# Patient Record
Sex: Male | Born: 1935
Health system: Southern US, Community
[De-identification: ages and names within clinical notes are randomized; demographics above are authoritative.]

## PROBLEM LIST (undated history)

## (undated) DIAGNOSIS — E785 Hyperlipidemia, unspecified: Secondary | ICD-10-CM

## (undated) DIAGNOSIS — L309 Dermatitis, unspecified: Secondary | ICD-10-CM

## (undated) DIAGNOSIS — N320 Bladder-neck obstruction: Secondary | ICD-10-CM

## (undated) DIAGNOSIS — R351 Nocturia: Secondary | ICD-10-CM

## (undated) DIAGNOSIS — T3121 Burns involving 20-29% of body surface with 10-19% third degree burns: Secondary | ICD-10-CM

## (undated) DIAGNOSIS — T4145XA Adverse effect of unspecified anesthetic, initial encounter: Secondary | ICD-10-CM

## (undated) DIAGNOSIS — Z974 Presence of external hearing-aid: Secondary | ICD-10-CM

## (undated) DIAGNOSIS — Z972 Presence of dental prosthetic device (complete) (partial): Secondary | ICD-10-CM

## (undated) DIAGNOSIS — T8859XA Other complications of anesthesia, initial encounter: Secondary | ICD-10-CM

## (undated) DIAGNOSIS — E039 Hypothyroidism, unspecified: Secondary | ICD-10-CM

## (undated) DIAGNOSIS — Z973 Presence of spectacles and contact lenses: Secondary | ICD-10-CM

## (undated) DIAGNOSIS — F419 Anxiety disorder, unspecified: Secondary | ICD-10-CM

## (undated) DIAGNOSIS — I10 Essential (primary) hypertension: Secondary | ICD-10-CM

## (undated) DIAGNOSIS — R600 Localized edema: Secondary | ICD-10-CM

## (undated) DIAGNOSIS — K5730 Diverticulosis of large intestine without perforation or abscess without bleeding: Secondary | ICD-10-CM

## (undated) DIAGNOSIS — Z87898 Personal history of other specified conditions: Secondary | ICD-10-CM

## (undated) DIAGNOSIS — E538 Deficiency of other specified B group vitamins: Secondary | ICD-10-CM

## (undated) DIAGNOSIS — J45909 Unspecified asthma, uncomplicated: Secondary | ICD-10-CM

## (undated) DIAGNOSIS — J309 Allergic rhinitis, unspecified: Secondary | ICD-10-CM

## (undated) HISTORY — DX: Essential (primary) hypertension: I10

## (undated) HISTORY — DX: Deficiency of other specified B group vitamins: E53.8

## (undated) HISTORY — DX: Burns involving 20-29% of body surface with 10-19% third degree burns: T31.21

## (undated) HISTORY — DX: Allergic rhinitis, unspecified: J30.9

## (undated) HISTORY — DX: Hyperlipidemia, unspecified: E78.5

## (undated) HISTORY — PX: COLONOSCOPY: SHX174

---

## 1979-05-12 DIAGNOSIS — T3121 Burns involving 20-29% of body surface with 10-19% third degree burns: Secondary | ICD-10-CM

## 1979-05-12 HISTORY — PX: ESCHAROTOMY: SHX248

## 1979-05-12 HISTORY — DX: Burns involving 20-29% of body surface with 10-19% third degree burns: T31.21

## 1998-06-28 ENCOUNTER — Other Ambulatory Visit: Admission: RE | Admit: 1998-06-28 | Discharge: 1998-06-28 | Payer: Self-pay | Admitting: Gastroenterology

## 2001-05-26 ENCOUNTER — Encounter (INDEPENDENT_AMBULATORY_CARE_PROVIDER_SITE_OTHER): Payer: Self-pay

## 2001-05-26 ENCOUNTER — Other Ambulatory Visit: Admission: RE | Admit: 2001-05-26 | Discharge: 2001-05-26 | Payer: Self-pay | Admitting: Otolaryngology

## 2004-02-02 ENCOUNTER — Ambulatory Visit (HOSPITAL_COMMUNITY): Admission: RE | Admit: 2004-02-02 | Discharge: 2004-02-02 | Payer: Self-pay | Admitting: Orthopedic Surgery

## 2004-03-01 ENCOUNTER — Emergency Department (HOSPITAL_COMMUNITY): Admission: EM | Admit: 2004-03-01 | Discharge: 2004-03-01 | Payer: Self-pay | Admitting: Emergency Medicine

## 2004-03-02 ENCOUNTER — Inpatient Hospital Stay (HOSPITAL_COMMUNITY): Admission: EM | Admit: 2004-03-02 | Discharge: 2004-03-06 | Payer: Self-pay | Admitting: Emergency Medicine

## 2004-03-04 HISTORY — PX: OTHER SURGICAL HISTORY: SHX169

## 2004-07-26 ENCOUNTER — Emergency Department (HOSPITAL_COMMUNITY): Admission: EM | Admit: 2004-07-26 | Discharge: 2004-07-26 | Payer: Self-pay | Admitting: Emergency Medicine

## 2006-02-01 ENCOUNTER — Ambulatory Visit: Payer: Self-pay | Admitting: Gastroenterology

## 2006-02-02 ENCOUNTER — Emergency Department (HOSPITAL_COMMUNITY): Admission: EM | Admit: 2006-02-02 | Discharge: 2006-02-02 | Payer: Self-pay | Admitting: Emergency Medicine

## 2006-02-20 ENCOUNTER — Ambulatory Visit: Payer: Self-pay | Admitting: Gastroenterology

## 2006-08-08 ENCOUNTER — Encounter: Admission: RE | Admit: 2006-08-08 | Discharge: 2006-08-08 | Payer: Self-pay | Admitting: Orthopedic Surgery

## 2007-09-17 ENCOUNTER — Emergency Department (HOSPITAL_COMMUNITY): Admission: EM | Admit: 2007-09-17 | Discharge: 2007-09-17 | Payer: Self-pay | Admitting: *Deleted

## 2008-02-06 ENCOUNTER — Encounter: Admission: RE | Admit: 2008-02-06 | Discharge: 2008-02-06 | Payer: Self-pay | Admitting: Orthopedic Surgery

## 2008-03-08 ENCOUNTER — Ambulatory Visit (HOSPITAL_COMMUNITY): Admission: RE | Admit: 2008-03-08 | Discharge: 2008-03-09 | Payer: Self-pay | Admitting: Orthopedic Surgery

## 2008-03-08 HISTORY — PX: HIP ARTHROSCOPY W/ LABRAL DEBRIDEMENT: SHX1749

## 2008-04-28 ENCOUNTER — Encounter: Admission: RE | Admit: 2008-04-28 | Discharge: 2008-04-28 | Payer: Self-pay | Admitting: Orthopedic Surgery

## 2008-07-12 ENCOUNTER — Emergency Department (HOSPITAL_BASED_OUTPATIENT_CLINIC_OR_DEPARTMENT_OTHER): Admission: EM | Admit: 2008-07-12 | Discharge: 2008-07-12 | Payer: Self-pay | Admitting: Emergency Medicine

## 2008-10-12 ENCOUNTER — Inpatient Hospital Stay (HOSPITAL_COMMUNITY): Admission: RE | Admit: 2008-10-12 | Discharge: 2008-10-17 | Payer: Self-pay | Admitting: Orthopedic Surgery

## 2008-10-12 HISTORY — PX: TOTAL HIP ARTHROPLASTY: SHX124

## 2009-01-05 ENCOUNTER — Ambulatory Visit (HOSPITAL_BASED_OUTPATIENT_CLINIC_OR_DEPARTMENT_OTHER): Admission: RE | Admit: 2009-01-05 | Discharge: 2009-01-06 | Payer: Self-pay | Admitting: Urology

## 2009-01-05 HISTORY — PX: TRANSURETHRAL RESECTION OF PROSTATE: SHX73

## 2009-01-07 ENCOUNTER — Emergency Department (HOSPITAL_COMMUNITY): Admission: EM | Admit: 2009-01-07 | Discharge: 2009-01-07 | Payer: Self-pay | Admitting: Emergency Medicine

## 2009-09-16 ENCOUNTER — Emergency Department (HOSPITAL_COMMUNITY): Admission: EM | Admit: 2009-09-16 | Discharge: 2009-09-16 | Payer: Self-pay | Admitting: Emergency Medicine

## 2009-12-13 ENCOUNTER — Encounter (INDEPENDENT_AMBULATORY_CARE_PROVIDER_SITE_OTHER): Payer: Self-pay | Admitting: *Deleted

## 2009-12-14 ENCOUNTER — Ambulatory Visit: Payer: Self-pay | Admitting: Gastroenterology

## 2009-12-28 ENCOUNTER — Ambulatory Visit: Payer: Self-pay | Admitting: Gastroenterology

## 2009-12-28 LAB — HM COLONOSCOPY

## 2010-09-28 ENCOUNTER — Ambulatory Visit (HOSPITAL_COMMUNITY)
Admission: RE | Admit: 2010-09-28 | Discharge: 2010-09-28 | Payer: Self-pay | Source: Home / Self Care | Attending: Orthopedic Surgery | Admitting: Orthopedic Surgery

## 2010-10-10 ENCOUNTER — Emergency Department (INDEPENDENT_AMBULATORY_CARE_PROVIDER_SITE_OTHER)
Admission: EM | Admit: 2010-10-10 | Discharge: 2010-10-10 | Payer: Self-pay | Source: Home / Self Care | Admitting: Emergency Medicine

## 2010-10-10 DIAGNOSIS — R042 Hemoptysis: Secondary | ICD-10-CM

## 2010-10-10 NOTE — Miscellaneous (Signed)
Summary: [facility] Previsit/prep  Clinical Lists Changes  Medications: Added new medication of MOVIPREP 100 GM  SOLR (PEG-KCL-NACL-NASULF-NA ASC-C) As per prep instructions. - Signed Rx of MOVIPREP 100 GM  SOLR (PEG-KCL-NACL-NASULF-NA ASC-C) As per prep instructions.;  #1 x 0;  Signed;  Entered by: Wyona Almas RN;  Authorized by: Mardella Layman MD [facility];  Method used: Electronically to [facility]*, [address] [place], [place], [place]  [address], Ph: [phone], Fax: [phone] Observations: Added new observation of NKA: T (12/14/2009 10:32)    Prescriptions: MOVIPREP 100 GM  SOLR (PEG-KCL-NACL-NASULF-NA ASC-C) As per prep instructions.  #1 x 0   Entered by:   Wyona Almas RN   Authorized by:   Mardella Layman MD [facility]   Signed by:   Wyona Almas RN on 12/14/2009   Method used:   Electronically to        [facility]* (retail)       [address] [place], [place]  [address]       Ph: [phone]       Fax: [phone]   RxID:   [id number]

## 2010-10-10 NOTE — Procedures (Signed)
Summary: Colonoscopy  Patient: [name] Note: All result statuses are Final unless otherwise noted.  Tests: (1) Colonoscopy (COL)   COL Colonoscopy           DONE     [facility]     [address]     [place], [place]  [address]           COLONOSCOPY PROCEDURE REPORT           PATIENT:  Erik Jackson, Erik Jackson  MR#:  [id number]     BIRTHDATE:  [birth year], 79 yrs. old  GENDER:  male     ENDOSCOPIST:  Vania Rea. Jarold Motto, MD, [facility]     REF. BY:     PROCEDURE DATE:  12/28/2009     PROCEDURE:  Surveillance Colonoscopy     ASA CLASS:  Class III     INDICATIONS:  Routine Risk Screening, history of polyps     MEDICATIONS:   Fentanyl 50 mcg IV, Versed 6 mg IV           DESCRIPTION OF PROCEDURE:   After the risks benefits and     alternatives of the procedure were thoroughly explained, informed     consent was obtained.  Digital rectal exam was performed and     revealed no abnormalities.   The Pentax Colonoscope [id number]     endoscope was introduced through the anus and advanced to the     cecum, which was identified by both the appendix and ileocecal     valve, without limitations.  The quality of the prep was     excellent, using MoviPrep.  The instrument was then slowly     withdrawn as the colon was fully examined.     <<PROCEDUREIMAGES>>           FINDINGS:  No polyps or cancers were seen.  This was otherwise a     normal examination of the colon.   Retroflexed views in the rectum     revealed no abnormalities.    The scope was then withdrawn from     the patient and the procedure completed.           COMPLICATIONS:  None     ENDOSCOPIC IMPRESSION:     1) No polyps or cancers     2) Otherwise normal examination     RECOMMENDATIONS:     1) Continue current colorectal screening recommendations for     "routine risk" patients with a repeat colonoscopy in 10 years.     REPEAT EXAM:  No           ______________________________     Vania Rea. Jarold Motto, MD, [facility]            CC:           n.     eSIGNED:   Vania Rea. Patterson at 12/28/2009 10:55 AM           Juanito Doom, [id number]  Note: An exclamation mark (!) indicates a result that was not dispersed into the flowsheet. Document Creation Date: 12/28/2009 10:56 AM _______________________________________________________________________  (1) Order result status: Final Collection or observation date-time: 12/28/2009 10:47 Requested date-time:  Receipt date-time:  Reported date-time:  Referring Physician:   Ordering Physician: Sheryn Bison [phone]) Specimen Source:  Source: Launa Grill Order Number: [id number] Lab site:   Appended Document: Colonoscopy    Clinical Lists Changes  Observations: Added new observation of COLONNXTDUE: 12/2019 (12/28/2009 14:08)

## 2010-10-10 NOTE — Letter (Signed)
Summary: [facility] Instructions  [facility]  [address] [place], [place] [address]   Phone: [phone]  Fax: [phone]       PHI AVANS    August 02, 1931    MRN: [id number]        Procedure Day Dorna Bloom: Wednesday  12/28/09     Arrival Time:  9:00am     Procedure Time: 10:00am     Location of Procedure:                    _X _  [facility] (4th Floor)                        PREPARATION FOR COLONOSCOPY WITH MOVIPREP   Starting 5 days prior to your procedure  Friday 04/15  do not eat nuts, seeds, popcorn, corn, beans, peas,  salads, or any raw vegetables.  Do not take any fiber supplements (e.g. Metamucil, Citrucel, and Benefiber).  THE DAY BEFORE YOUR PROCEDURE         DATE:  04/19   DAY:  Tuesday  1.  Drink clear liquids the entire day-NO SOLID FOOD  2.  Do not drink anything colored red or purple.  Avoid juices with pulp.  No orange juice.  3.  Drink at least 64 oz. (8 glasses) of fluid/clear liquids during the day to prevent dehydration and help the prep work efficiently.  CLEAR LIQUIDS INCLUDE: Water Jello Ice Popsicles Tea (sugar ok, no milk/cream) Powdered fruit flavored drinks Coffee (sugar ok, no milk/cream) Gatorade Juice: apple, white grape, white cranberry  Lemonade Clear bullion, consomm, broth Carbonated beverages (any kind) Strained chicken noodle soup Hard Candy                             4.  In the morning, mix first dose of MoviPrep solution:    Empty 1 Pouch A and 1 Pouch B into the disposable container    Add lukewarm drinking water to the top line of the container. Mix to dissolve    Refrigerate (mixed solution should be used within 24 hrs)  5.  Begin drinking the prep at 5:00 p.m. The MoviPrep container is divided by 4 marks.   Every 15 minutes drink the solution down to the next mark (approximately 8 oz) until the full liter is complete.   6.  Follow completed prep with 16 oz of clear liquid of your  choice (Nothing red or purple).  Continue to drink clear liquids until bedtime.  7.  Before going to bed, mix second dose of MoviPrep solution:    Empty 1 Pouch A and 1 Pouch B into the disposable container    Add lukewarm drinking water to the top line of the container. Mix to dissolve    Refrigerate  THE DAY OF YOUR PROCEDURE      DATE:  04/20  DAY:  Wednesday  Beginning at  5:00 a.m. (5 hours before procedure):         1. Every 15 minutes, drink the solution down to the next mark (approx 8 oz) until the full liter is complete.  2. Follow completed prep with 16 oz. of clear liquid of your choice.    3. You may drink clear liquids until 8:00am  (2 HOURS BEFORE PROCEDURE).   MEDICATION INSTRUCTIONS  Unless otherwise instructed, you should take regular prescription medications with a small sip of  water   as early as possible the morning of your procedure.   Additional medication instructions: Hold HCTZ the morning of procedure.         OTHER INSTRUCTIONS  You will need a responsible adult at least 75 years of age to accompany you and drive you home.   This person must remain in the waiting room during your procedure.  Wear loose fitting clothing that is easily removed.  Leave jewelry and other valuables at home.  However, you may wish to bring a book to read or  an iPod/MP3 player to listen to music as you wait for your procedure to start.  Remove all body piercing jewelry and leave at home.  Total time from sign-in until discharge is approximately 2-3 hours.  You should go home directly after your procedure and rest.  You can resume normal activities the  day after your procedure.  The day of your procedure you should not:   Drive   Make legal decisions   Operate machinery   Drink alcohol   Return to work  You will receive specific instructions about eating, activities and medications before you leave.    The above instructions have been reviewed and  explained to me by   Wyona Almas RN  December 14, 2009 11:33 AM    I fully understand and can verbalize these instructions _____________________________ Date _________

## 2010-11-26 LAB — CBC
HCT: 44.5 % (ref 39.0–52.0)
Hemoglobin: 15.4 g/dL (ref 13.0–17.0)
MCHC: 34.6 g/dL (ref 30.0–36.0)
MCV: 96 fL (ref 78.0–100.0)
Platelets: 237 10*3/uL (ref 150–400)
RBC: 4.64 MIL/uL (ref 4.22–5.81)
RDW: 14.4 % (ref 11.5–15.5)
WBC: 6.1 10*3/uL (ref 4.0–10.5)

## 2010-11-26 LAB — BASIC METABOLIC PANEL
BUN: 18 mg/dL (ref 6–23)
CO2: 29 mEq/L (ref 19–32)
Calcium: 9.6 mg/dL (ref 8.4–10.5)
Chloride: 98 mEq/L (ref 96–112)
Creatinine, Ser: 0.88 mg/dL (ref 0.4–1.5)
GFR calc Af Amer: >60 mL/min (ref >60)
GFR calc non Af Amer: >60 mL/min (ref >60)
Glucose, Bld: 90 mg/dL (ref 70–99)
Potassium: 3.6 mEq/L (ref 3.5–5.1)
Sodium: 138 mEq/L (ref 135–145)

## 2010-11-26 LAB — DIFFERENTIAL
Basophils Absolute: 0.1 10*3/uL (ref 0.0–0.1)
Basophils Relative: 1 % (ref 0–1)
Eosinophils Absolute: 0.5 10*3/uL (ref 0.0–0.7)
Eosinophils Relative: 7 % — ABNORMAL HIGH (ref 0–5)
Lymphocytes Relative: 33 % (ref 12–46)
Lymphs Abs: 2 10*3/uL (ref 0.7–4.0)
Monocytes Absolute: 0.8 10*3/uL (ref 0.1–1.0)
Monocytes Relative: 13 % — ABNORMAL HIGH (ref 3–12)
Neutro Abs: 2.8 10*3/uL (ref 1.7–7.7)
Neutrophils Relative %: 46 % (ref 43–77)

## 2010-12-20 LAB — POCT I-STAT 4, (NA,K, GLUC, HGB,HCT)
Glucose, Bld: 91 mg/dL (ref 70–99)
HCT: 46 % (ref 39.0–52.0)
Hemoglobin: 15.6 g/dL (ref 13.0–17.0)
Potassium: 3.8 mEq/L (ref 3.5–5.1)
Sodium: 140 mEq/L (ref 135–145)

## 2010-12-20 LAB — URINE CULTURE
Colony Count: NO GROWTH
Culture: NO GROWTH

## 2010-12-25 LAB — COMPREHENSIVE METABOLIC PANEL
ALT: 37 U/L (ref 0–53)
AST: 23 U/L (ref 0–37)
Albumin: 3.9 g/dL (ref 3.5–5.2)
Alkaline Phosphatase: 37 U/L — ABNORMAL LOW (ref 39–117)
CO2: 27 mEq/L (ref 19–32)
Chloride: 100 mEq/L (ref 96–112)
Creatinine, Ser: 1.19 mg/dL (ref 0.4–1.5)
GFR calc Af Amer: >60 mL/min (ref >60)
GFR calc non Af Amer: 60 mL/min — ABNORMAL LOW (ref >60)
Potassium: 3.6 mEq/L (ref 3.5–5.1)
Sodium: 138 mEq/L (ref 135–145)
Total Bilirubin: 0.8 mg/dL (ref 0.3–1.2)

## 2010-12-25 LAB — URINE MICROSCOPIC-ADD ON

## 2010-12-25 LAB — CBC
MCV: 95.8 fL (ref 78.0–100.0)
Platelets: 274 10*3/uL (ref 150–400)
RBC: 4.41 MIL/uL (ref 4.22–5.81)
WBC: 7.3 10*3/uL (ref 4.0–10.5)

## 2010-12-25 LAB — URINALYSIS, ROUTINE W REFLEX MICROSCOPIC
Bilirubin Urine: NEGATIVE
Glucose, UA: NEGATIVE mg/dL
Ketones, ur: NEGATIVE mg/dL
Leukocytes, UA: NEGATIVE
Nitrite: NEGATIVE
Protein, ur: NEGATIVE mg/dL
Specific Gravity, Urine: 1.018 (ref 1.005–1.030)
Urobilinogen, UA: 0.2 mg/dL (ref 0.0–1.0)
pH: 6 (ref 5.0–8.0)

## 2010-12-26 LAB — BASIC METABOLIC PANEL
BUN: 22 mg/dL (ref 6–23)
BUN: 9 mg/dL (ref 6–23)
CO2: 29 mEq/L (ref 19–32)
CO2: 29 mEq/L (ref 19–32)
CO2: 31 mEq/L (ref 19–32)
Chloride: 93 mEq/L — ABNORMAL LOW (ref 96–112)
Chloride: 95 mEq/L — ABNORMAL LOW (ref 96–112)
Chloride: 99 mEq/L (ref 96–112)
Chloride: 99 mEq/L (ref 96–112)
Creatinine, Ser: 0.86 mg/dL (ref 0.4–1.5)
GFR calc Af Amer: >60 mL/min (ref >60)
GFR calc Af Amer: >60 mL/min (ref >60)
GFR calc non Af Amer: >60 mL/min (ref >60)
Glucose, Bld: 115 mg/dL — ABNORMAL HIGH (ref 70–99)
Glucose, Bld: 128 mg/dL — ABNORMAL HIGH (ref 70–99)
Glucose, Bld: 97 mg/dL (ref 70–99)
Potassium: 3.8 mEq/L (ref 3.5–5.1)
Potassium: 3.8 mEq/L (ref 3.5–5.1)
Sodium: 132 mEq/L — ABNORMAL LOW (ref 135–145)
Sodium: 134 mEq/L — ABNORMAL LOW (ref 135–145)
Sodium: 136 mEq/L (ref 135–145)

## 2010-12-26 LAB — PROTIME-INR
INR: 1.1 (ref 0.00–1.49)
INR: 2.1 — ABNORMAL HIGH (ref 0.00–1.49)
Prothrombin Time: 14.8 seconds (ref 11.6–15.2)
Prothrombin Time: 25.2 seconds — ABNORMAL HIGH (ref 11.6–15.2)

## 2010-12-26 LAB — CBC
HCT: 33.5 % — ABNORMAL LOW (ref 39.0–52.0)
HCT: 33.8 % — ABNORMAL LOW (ref 39.0–52.0)
Hemoglobin: 11.5 g/dL — ABNORMAL LOW (ref 13.0–17.0)
Hemoglobin: 11.6 g/dL — ABNORMAL LOW (ref 13.0–17.0)
Hemoglobin: 12.5 g/dL — ABNORMAL LOW (ref 13.0–17.0)
MCHC: 33.9 g/dL (ref 30.0–36.0)
MCHC: 34.3 g/dL (ref 30.0–36.0)
MCV: 95.2 fL (ref 78.0–100.0)
MCV: 95.4 fL (ref 78.0–100.0)
MCV: 96.5 fL (ref 78.0–100.0)
Platelets: 182 10*3/uL (ref 150–400)
RBC: 3.5 MIL/uL — ABNORMAL LOW (ref 4.22–5.81)
RBC: 3.83 MIL/uL — ABNORMAL LOW (ref 4.22–5.81)
RDW: 13.9 % (ref 11.5–15.5)
WBC: 10.5 10*3/uL (ref 4.0–10.5)
WBC: 12.6 10*3/uL — ABNORMAL HIGH (ref 4.0–10.5)

## 2010-12-26 LAB — TYPE AND SCREEN

## 2011-01-23 NOTE — Op Note (Signed)
NAMELAMARK, SCHUE                ACCOUNT NO.:  [id number]   MEDICAL RECORD NO.:  [id number]          PATIENT TYPE:  AMB   LOCATION:  [facility]                         FACILITY:  [facility]   PHYSICIAN:  Maretta Bees. Vonita Moss, M.D.DATE OF BIRTH:  03/11/1931   DATE OF PROCEDURE:  01/05/2009  DATE OF DISCHARGE:                               OPERATIVE REPORT   PREOPERATIVE DIAGNOSES:  1. Benign prostatic hyperplasia.  2. High bladder neck.   POSTOPERATIVE DIAGNOSES:  1. Benign prostatic hyperplasia.  2. High bladder neck.   PRINCIPLE PROCEDURE:  Transurethral resection of the prostate (Gyrus).   SURGEON:  Maretta Bees. Vonita Moss, M.D.   RESIDENT:  Dr. Georgeanna Lea.   ANESTHESIA:  General.   DRAINS:  A 24 French 2-way Foley catheter.   COMPLICATIONS:  None.   ESTIMATED BLOOD LOSS:  Minimal.   INDICATIONS FOR PROCEDURE:  Patient is a 77 year old gentleman who had a  longstanding history of benign prostatic hyperplasia with bothersome  urinary tract symptoms, mainly hesitancy, straining to urinate, small  voids, nocturia.  Patient had tried medical therapy in the form of  anticholinergics and alpha antagonists using Flomax and Rapaflo with  minimal improvement in his symptoms.  Patient was evaluated with office  cystoscopy which revealed a high bladder neck, moderate bilobar  prostatic enlargement.  The decision was made after discussing the  different treatment options to bring him to the operating room for  transurethral resection of the prostate using Gyrus electrode.  The  risks and benefits of the procedure were explained, and informed consent  obtained.   DESCRIPTION OF PROCEDURE IN DETAIL:  Patient brought to the operating  room.  Placed in a supine position.  Administered general anesthesia by  the anesthesia team.  A proper time-out was performed to correctly  identify patient, procedure, and the site.  Patient was given  appropriate preoperative antibiotic.  Patient was  subsequently placed in  a dorsal lithotomy position.  Bilateral pressure points were well  padded.  Patient was prepped and draped in the usual sterile manner.  Using a 22 French sheath and a 12 degree lens, we then performed  cystourethroscopy.  The patient's anterior urethra was normal.  Posterior urethra revealed moderate bilobar prostatic enlargement.  Upon  entering the bladder, the right and left ureteral orifices were then  found in normal anatomic position.  Of note, we also appreciated again,  a high bladder neck.  The rest of the cystoscopy revealed 2+  trabeculations.  No other mass, lesion, or stone was seen.  We then  removed the cystoscope, performed urethral calibration beginning from a  24 [place], going up to 30 [place].  Obturators were placed, 28 French  resectoscope sheath, and then Gyrus electrode was introduced after  resectoscope.  We then performed sequential resection of the prostate  using Gyrus electrode, beginning at the 6 o'clock position.  At all  times, care was taken to be distal to the ureteral orifices and proximal  to the verumontanum.  We then sequentially used Gyrus electrode to  vaporize bilaterally to enlarged bilobar prostatic lobes.  We  then used  Gyrus electrode to vaporized both the lateral lobes.  We then performed  vaporization of the anterior aspect.  There was no active bleeding seen.  We examined the prostatic urethra at the verumontanum under no flow.  The channel looked adequately open.  We again examined the resection  bed.  For the most part, we had resected down to the prostatic capsule,  it seemed.  We then removed the resectoscope and placed a 24 French 2-  way Foley catheter and irrigated to light pink.  This marked the end of  the procedure.  Patient was subsequently extubated and transferred in  stable condition to the recovery room.   Of note, Dr. Vonita Moss was present and available for all of the aspects  of the case.       Erik Kitten, MD      Maretta Bees. Vonita Moss, M.D.  Electronically Signed    DW/MEDQ  D:  01/05/2009  T:  01/05/2009  Job:  [id number]

## 2011-01-23 NOTE — Op Note (Signed)
NAMEARLEY, SALAMONE                ACCOUNT NO.:  [id number]   MEDICAL RECORD NO.:  [id number]          PATIENT TYPE:  AMB   LOCATION:  DAY                          FACILITY:  [facility]   PHYSICIAN:  Ollen Gross, M.D.    DATE OF BIRTH:  [birth year]   DATE OF PROCEDURE:  03/08/2008  DATE OF DISCHARGE:                               OPERATIVE REPORT   PREOPERATIVE DIAGNOSIS:  Right hip labral tear.   POSTOPERATIVE DIAGNOSIS:  Right hip labral tear, plus chondral defect  and degenerative change.   PROCEDURE:  Right hip arthroscopy with labral debridement and  chondroplasty.   SURGEON:  Dr. Lequita Halt.   ASSISTANT:  Avel Peace, P.A.-C.   ANESTHESIA:  General.   ESTIMATED BLOOD LOSS:  Minimal.   DRAINS:  None.   COMPLICATIONS:  None.   CONDITION:  Stable to recovery.   CLINICAL NOTE:  Mr. Kundinger is a 77 year old male who has had several year  history of intermittent hip pain but has gotten progressively worse over  the past several months.  He developed intractable pain.  His plain  radiographs still showed good preservation of joint space.  He did have  intra-articular injection which did help temporarily, but then the pain  got a lot worse.  He is not at a stage where we have contemplated hip  replacement surgery.  His pain, however, is intractable with mechanical  symptoms.  Thus, we opted for arthroscopy and debridement.   PROCEDURE IN DETAIL:  After successful administration of general  anesthetic, the patient was placed in the left lateral decubitus  position with the right side up with his right lower extremity draped  over the well-padded perineal post, left lower extremity on a well-  padded table.  The foot was placed into the traction boot which was then  secured.  Under fluoroscopic guidance, traction was applied across the  joint until it was adequately distracted.  Once adequately distracted,  it was locked into position.  The thighs were then prepped and draped in  the usual sterile fashion.  Spinal needles were passed at the sites  marked for the anterior and posterior peritrochanteric portal and felt  to go into the joint.  Thirty mL of saline was injected through the  posterior needle with outflow through the anterior needle confirming  that both are intra-articular.  Nitinol wire was passed, and the  posterior portal was created.  The camera was then passed into the  joint.  Arthroscopic visualization proceeded.  He had fairly extensive  degenerative change with high-grade chondromalacia as well as exposed  bone on the central aspect of the femoral head.  The anterior and  posterior chondromalacia without exposed bone.  On the acetabulum, the  whole anterior half had a combination of high-grade chondromalacia,  grade 3 and 4, with exposed bone and in focal areas.  He has torn the  anterior and superior labrum.  Posteriorly, the joint showed  chondromalacia but not exposed bone.  We created the anterior portal and  then passed a shaver.  Unstable cartilage was debrided back to a stable  cartilaginous edge and a stable bony base.  The labrum was also debrided  down to a stable base and then sealed off with the ArthroCare device.  I  again inspected entire joint.  There was no other unstable cartilage.  Once again, he had some grade 3 and 4 changes on the femoral head and  anteriorly, but all of the cartilage remaining was stable.  I then  injected 30 mL of 0.25% Marcaine with epinephrine through the inflow  cannula, removed the arthroscopic lamina, and released the traction from  the joint.  The incision was then closed interrupted 4-0 nylon.  A bulky  sterile dressing was applied, and he was awakened and transported to  recovery in stable condition.      Ollen Gross, M.D.  Electronically Signed     FA/MEDQ  D:  03/08/2008  T:  03/08/2008  Job:  [id number]

## 2011-01-23 NOTE — H&P (Signed)
Erik Jackson, Erik Jackson                ACCOUNT NO.:  [id number]   MEDICAL RECORD NO.:  [id number]          PATIENT TYPE:  INP   LOCATION:  1619                         FACILITY:  [facility]   PHYSICIAN:  Ollen Gross, M.D.    DATE OF BIRTH:  [birth year]   DATE OF ADMISSION:  10/12/2008  DATE OF DISCHARGE:                              HISTORY & PHYSICAL   CHIEF COMPLAINT:  Right hip pain.   HISTORY OF PRESENT ILLNESS:  Patient is a 77 year old male who has been  seen by Dr. Lequita Halt for ongoing right hip pain and arthritis.  Pain is  progressively getting worse.  He had had an arthroscopy several months  ago but was noted to have bone on bone changes in the hip.  He has gone  on to have progressive pain.  Intra-articular injection only helped  temporarily, he is at a point now where he would like to have something  done about it.  Risks and benefits have been discussed, he elects to  proceed with the surgery.   ALLERGIES:  NONE.   CURRENT MEDICATIONS:  Pravastatin, finasteride, hydrochlorothiazide,  lisinopril, aspirin, amlodipine, Flomax.   PAST MEDICAL HISTORY:  1. Hypertension.  2. Questionable remote history of enlarged prostate.  3. Urinary retention.  4. Hearing impairment utilizing hearing aids.   PAST SURGICAL HISTORY:  1. Skin grafting.  2. Cervical fusion.   FAMILY HISTORY:  Father with history of cancer, mother with history of  heart disease and pacemaker.   SOCIAL HISTORY:  Married, nonsmoker, no alcohol.  Lives with wife, has 3  steps entering his home.   REVIEW OF SYSTEMS:  GENERAL:  Denied fevers, chills or night sweats.  NEURO:  No seizures, syncope or paralysis.  RESPIRATORY:  No shortness  of breath, productive cough or hemoptysis.  CARDIOVASCULAR:  No chest  pain, or orthopnea.  GI:  No nausea, vomiting, diarrhea, constipation.  GU:  Does have a little bit of urinary retention with a questionable  history of enlarged prostate, no dysuria or hematuria.   MUSCULOSKELETAL:  Right hip.   PHYSICAL EXAMINATION:  VITAL SIGNS:  Pulse 76, respirations 14, blood  pressure 124/72.  GENERAL:  A 77 year old white male, well-nourished, well-developed, in  no acute distress.  He is alert and oriented, cooperative, very  pleasant.  HEENT:  Normocephalic/atraumatic, pupils are round and reactive,  oropharynx clear, EOMs intact.  NECK:  Supple, no bruits.  CHEST:  Clear.  HEART:  Regular rate and rhythm without murmur, S1-S2 noted.  ABDOMEN:  Soft, nontender, bowel sound present.  RECTAL/BREASTS/GENITALIA:  Not done, not pertinent to present illness.  EXTREMITIES:  Right hip flexion 100, internal rotation 10, external  rotation 20, abduction 30.   IMPRESSION:  Osteoarthritis right hip.   PLAN:  Patient admitted to [facility] to undergo a right total  hip replacement and arthroplasty.  Surgery will be performed by Dr.  Ollen Gross.      Alexzandrew L. Perkins, P.A.C.      Ollen Gross, M.D.  Electronically Signed    ALP/MEDQ  D:  10/14/2008  T:  10/14/2008  Job:  [id number]   cc:   Maretta Bees. Vonita Moss, M.D.  Fax: [phone]   Dr. Lesle Chris [facility] [place]  [address]  [place], [place] [address]   Ollen Gross, M.D.  Fax: [phone]

## 2011-01-23 NOTE — Op Note (Signed)
NAMEZACARIAS, KRAUTER                ACCOUNT NO.:  [id number]   MEDICAL RECORD NO.:  [id number]          PATIENT TYPE:  INP   LOCATION:  1619                         [facility]ACILITY:  [facility]   PHYSICIAN:  Ollen Gross, M.D.    DATE OF BIRTH:  01-12-1931   DATE OF PROCEDURE:  10/12/2008  DATE OF DISCHARGE:                               OPERATIVE REPORT   PREOPERATIVE DIAGNOSIS:  Osteoarthritis, right hip.   POSTOPERATIVE DIAGNOSIS:  Osteoarthritis, right hip.   PROCEDURE:  Right total hip arthroplasty.   SURGEON:  Aluisio.   ASSISTANT:  Avel Peace, PA-C.   ANESTHESIA:  General.   ESTIMATED BLOOD LOSS:  300.   DRAIN:  Hemovac x1.   COMPLICATIONS:  None.   CONDITION:  Stable to recovery.   BRIEF CLINICAL NOTE:  Mr. Knoedler is a 77 year old male with end-stage  arthritis of the right hip with progressively worsening pain and  dysfunction.  He has had a hip arthroscopy and extensive nonoperative  management, but his pain has gotten progressively worse.  He presents  now for total hip arthroplasty.   PROCEDURE IN DETAIL:  After successful administration of general  anesthetic, the patient was placed in the left lateral decubitus  position with the right side up and held with the hip positioner.  The  right lower extremity was isolated from his perineum with plastic drapes  and prepped and draped in the usual sterile fashion.  Short  posterolateral incision is made with a 10 blade through subcutaneous  tissue to the level of fascia lata which is incised in line with the  skin incision.  Sciatic nerve was palpated and protected and the short  external rotators isolated off the femur.  Capsulectomy is performed and  the hip was dislocated.  The center of the femoral head is marked and a  trial prosthesis is placed such that the center of the trial head  corresponds to the center of his native femoral head.  Osteotomy line is  marked on the femoral neck and osteotomy made with an  oscillating saw.  The femoral head is removed and the femur is retracted anteriorly to  gain acetabular exposure.   Acetabular retractors were placed in the labrum and osteophytes removed.  Acetabular reaming starts at 45 mm coursing increments to 53 mm and then  a 54 mm pinnacle acetabular shell is placed in anatomic position and  transfixed with two dome screws.  Trial 36 mm neutral +4 liner is  placed.   The femur is prepared with the canal finder and irrigation.  Axial  reaming is performed at 13.5 mm, proximal reaming to 18 D and the sleeve  machined to a large.  The 81 D large trial sleeve is placed with an 18 x  13 stem and 36 +8 neck about 10 degrees beyond native anteversion.  The  trial 36 +0 head is placed and the hip is reduced with outstanding  stability.  There is full extension, full external rotation, 90 degrees  flexion, 70 degrees internal rotation and the hip stable in those  planes.  By placing  the right leg on top of the left the leg lengths  were felt to be equal.  The hip is then dislocated and all trials are  removed.  The permanent apex hole eliminator is placed in the acetabular  shell and the permanent 36 mm neutral +4 marathon liner was placed.  On  the femoral side we placed the permanent 18 D large sleeve, an 18 x 13  stem, a 36 +8 neck 20 degrees beyond native anteversion.  The 36 +0 head  is placed and the hip is reduced with the same stability parameters.  The wound is copiously irrigated with saline solution and the short  rotators reattached to the femur through drill holes.  Fascia lata is  closed over a Hemovac drain with interrupted #1 Vicryl, subcu closed  with #1-0 and #2-0 Vicryl and subcuticular running 4-0 Monocryl.  The  drain is hooked to suction.  Incision cleaned and dried and Steri-Strips  and a bulky sterile dressing applied.  He is then placed into a knee  immobilizer, awakened and transported to recovery in stable condition.       Ollen Gross, M.D.  Electronically Signed     FA/MEDQ  D:  10/12/2008  T:  10/13/2008  Job:  [id number]

## 2011-01-26 NOTE — Op Note (Signed)
NAME:  Erik Jackson, BIELLO                          ACCOUNT NO.:  [id number]   MEDICAL RECORD NO.:  [id number]                   PATIENT TYPE:  INP   LOCATION:  6705                                 FACILITY:  [facility]   PHYSICIAN:  Suzanna Obey, M.D.                    DATE OF BIRTH:  Dec 18, 1930   DATE OF PROCEDURE:  03/04/2004  DATE OF DISCHARGE:                                 OPERATIVE REPORT   PREOPERATIVE DIAGNOSIS:  Left peritonsillar and inferior space infection.   POSTOPERATIVE DIAGNOSIS:  Left peritonsillar and inferior space infection.   OPERATION PERFORMED:  Incision and drainage of left peritonsillar abscess.   SURGEON:  Suzanna Obey, M.D.   ANESTHESIA:  General endotracheal.   ESTIMATED BLOOD LOSS:  Less than 10 mL.   INDICATIONS FOR PROCEDURE:  This is a 77 year old who has had significant  pharyngeal and laryngeal swelling that occurred somewhat suddenly but  roughly over a day or two.  He was seen in the emergency room, had not  improved and was admitted with laryngeal edema and placed on steroids and  intravenous antibiotics.  The patient had a CT scan which showed a 1 cm left  peritonsillar abscess and a significant amount of laryngeal and pharyngeal  edema.  This was cooled off with the medical therapy and now a repeat CT  scan showed enlargement of the left peritonsillar abscess area, but  decreased laryngeal and pharyngeal edema.  The patient was informed of the  risks and benefits of the procedure including bleeding, infection, scarring,  recurrence, and risks of the anesthetic.  He understands that tracheotomy  possibly might be necessary.  This was described and risks including  bleeding, infection, pneumothorax, laryngeal and tracheal scarring and risks  of the anesthetic.  All questions were answered and consent was obtained.   DESCRIPTION OF PROCEDURE:  The patient was taken to the operating room and  placed in supine position after adequate general endotracheal  tube  anesthesia, was placed in the Rose position.  A McIvor mouth gag was  inserted and retracted and suspended from the Mayo stand.  A left  peritonsillar incision was made with electrocautery.  Dissection was carried  down to the peritonsillar space with electrocautery and then using the  tonsil hemostat, the peritonsillar space was opened.  Foul-smelling brown  pus was encountered and this area was opened widely with the tonsil  hemostat.  The tonsil hemostat was placed deeply inferiorly and medially to  drain all aspects of this abscess cavity which did extend quite inferiorly  along below the tonsil.  A Jankauer suction was then placed into the opening  and again to completely drain the abscess space.  Through the Jankauer  suction, the saline irrigation was performed to irrigate the cavity.  There  was good hemostasis.  The larynx was then examined with the laryngoscopy and  he appeared to  have no significant edema of his arytenoids or glottis and  there was still of course posterior pharyngeal wall swelling and left  lateral pharyngeal wall swelling.  He tolerated this procedure very well.  He was awakened and brought to recovery in stable condition, counts correct.                                               Suzanna Obey, M.D.   [name]  D:  03/04/2004  T:  03/05/2004  Job:  [id number]

## 2011-01-26 NOTE — Discharge Summary (Signed)
NAME:  Erik Jackson, Erik Jackson                          ACCOUNT NO.:  [id number]   MEDICAL RECORD NO.:  [id number]                   PATIENT TYPE:  INP   LOCATION:  6705                                 FACILITY:  [facility]   PHYSICIAN:  Suzanna Obey, M.D.                    DATE OF BIRTH:  [birth year]   DATE OF ADMISSION:  03/02/2004  DATE OF DISCHARGE:  03/06/2004                                 DISCHARGE SUMMARY   ADMITTING DIAGNOSES:  Supraglottitis.   DISCHARGE DIAGNOSES:  Supraglottitis.   SURGICAL PROCEDURES:  None.   HISTORY OF PRESENT ILLNESS:  This is a 77 year old who has about a two day  history of swelling in his throat and sore throat as well as difficulty  swallowing.  He has also had a fever and trismus.  Started after an upper  respiratory infection.  Went to [facility] and had plain  film x-ray which did not show what the results are.  He was discharged  without any antibiotics.  He has had increasing symptoms, difficulty taking  p.o.  He has not had any trauma or foreign body ingestion and no strider.  He, on examination, had edema of his epiglottis and arytenoids, and the  piriforms were both obliterated.  He was admitted for intravenous  antibiotics.   HOSPITAL COURSE:  He did reasonably well.  Remained without any strider.  Did have some wheezing bilaterally in the lungs.  Postoperative day one his  edema was decreased and this was with intravenous antibiotics and steroids.  He also was treated with albuterol for the wheezing which did improve.  He  continued to have sore throat and difficulty handling his own secretions.  Even though the edema was decreased, he still had swelling in the left  tonsillar region suggesting that he was having a peritonsillar abscess.  This was confirmed with a CT scan.  It was elected, therefore, that despite  intravenous antibiotics and steroids, he was not improving and that an  abscess needed to be drained.  He was taken to  the operating room and  underwent incision and drainage of left peritonsillar abscess.  Significant  purulent material was identified and extended quite significantly inferiorly  below the tonsil in the deep tissue.  This was all irrigated.  After this  procedure he felt substantially better.  He was able to swallow.  Steroids  were discontinued.  Transferred to the floor.  He was ambulating.  He was  afebrile.  He was discharged to home on Augmentin XR two pills b.i.d. for 10  days.  He will follow up in two weeks, sooner if he is worse.  Suzanna Obey, M.D.    [name]  D:  04/20/2004  T:  04/21/2004  Job:  [id number]

## 2011-01-26 NOTE — Discharge Summary (Signed)
NAMEFRAZER, RAINVILLE                ACCOUNT NO.:  [id number]   MEDICAL RECORD NO.:  [id number]          PATIENT TYPE:  INP   LOCATION:  1619                         FACILITY:  [facility]   PHYSICIAN:  Ollen Gross, M.D.    DATE OF BIRTH:  1931-07-20   DATE OF ADMISSION:  10/12/2008  DATE OF DISCHARGE:  10/17/2008                               DISCHARGE SUMMARY   ADMITTING DIAGNOSIS:  1. Osteoarthritis right hip.  2. Hypertension.  3. Questionable remote history of enlarged prostate.  4. Urinary retention hearing impairment.   DISCHARGE DIAGNOSIS:  1. Osteoarthritis right hip status post right total hip replacement      arthroplasty.  2. Hyponatremia.  3. Questionable remote history of enlarged prostate.  4. Urinary retention hearing impairment.  5. Hypertension.   PROCEDURE:  October 12, 2008 right total hip.  Surgeon Dr. Lequita Halt.  Assistant Avel Peace, PA-C.  Anesthesia general.   CONSULTS:  None.   BRIEF HISTORY:  [name] is a 77 year old male with end-stage  arthritis, progressive worsening pain and dysfunction, had an  arthroscopy extensive failed nonoperative management was got progressive  pain and worsening, he is admitted for total hip.  Risks and benefits  discussed.  The patient admitted to the hospital.   LABORATORY DATA:  Preop CBC showed hemoglobin of 14.3, crit of 42,  platelets 278,000.  Postoperative hemoglobin 12.5, drift down to 11.6.  Last H and H 11.5 and 33.  PT/PTT preop 13.9 and 27 respectively.  INR  1.  Followed per Coumadin protocol.  Last PT/INR 24.9 and 2.1.  Chem  panel on admission all within normal limits with exception low sodium  137.  BMET followed.  Sodium did drop 138 to 132 was improving back up  to 133, glucose went up 107 to 142 back down to 97.  Preop UA moderate  hemoglobin 7-10 red cells otherwise negative.  Blood group type A+.   EKG October 04, 2008 normal sinus rhythm, normal EKG.  Unconfirmed hip  films x-rays October 08, 2008  with significant general changes right hip  with significant narrowing of the superior joint space.  Hip and pelvis  film on October 12, 2008 satisfactory right total hip replacement.   HOSPITAL COURSE:  The patient admitted was taken to OR, underwent above-  stated procedure without complication.  The patient's procedure well,  later transferred to the orthopedic floor started on PCA and p.o.  analgesic given 24 hours postop IV antibiotics.  Coumadin for DVT  prophylaxis.  On the morning of day one the patient had pretty good  night doing pretty well following surgery and blood pressure was stable.  Hemoglobin was good at 12.5.  Hemovac drain was pulled.  He did have a  Foley had a history of urinary retention.  We tried his Foley out the  next morning for voiding trial.  Sodium was down a little bit down to  134.  Discontinued fluids and output was good, titrate back up.  Had  constipation so we gave medications for that.  He started to walk short  distance on day  one a day 1 of 20 and 60 feet.  Started to progress  well.  Dressing change on day two and day three incision continued to  heal well throughout the hospital course.  By day three his sodium  bottomed out of 132 and then started coming back up.  He fell on the  evening of day two.  The difficulty voiding had to have his Foley were  replaced.  On the morning of day three he was rechecked.  We did have  documented history of some problems with his prostate.  We had to put  Foley back again on the night of day two and we tried again on day three  for voiding trial and decided we would get him back to the urologist if  he did have problems.  He started to void okay.  By day four the patient  was a little weak after his therapy, so the patient discharge was held.  Held blood pressure meds for any kind of hypotension.  He kept another  day until October 17, 2008, postop day #5 still had difficulty  urinating.  The wound was healing  well.  They had the Foley catheter  back in and switched him over to a leg bag.  Once this was done, he had  already started to meet his goals with therapy.  Improved from that  standpoint was discharged home.   DISCHARGE/PLAN:  1. The patient discharged home on October 17, 2008.  2. Discharge diagnosis please see above.  3. Discharge meds:  Percocet, Robaxin, Coumadin.  4. Diet as tolerated.  5. Follow up 2 weeks with Dr. Lequita Halt following surgery.  He is also      to follow up Dr. Larey Dresser within a week of discharge due to      have an Foley leg bag placed.   ACTIVITY:  Partial weightbearing, 25-50% right hip and right leg.  Hip  precautions total protocol.  PT and OT.  Home health PT and home health  nursing.   DISPOSITION:  Home.   CONDITION ON DISCHARGE:  Improving from orthopedic standpoint.  Urinary  retention with leg bag Foley placed to follow up urology outpatient.      Alexzandrew L. Perkins, P.A.C.      Ollen Gross, M.D.  Electronically Signed    ALP/MEDQ  D:  11/18/2008  T:  11/18/2008  Job:  [id number]   cc:   Ollen Gross, M.D.  Fax: [phone]   Maretta Bees. Vonita Moss, M.D.  Fax: [phone]   Dr. Donivan Scull [facility]  [address]  [place], [place] [address]

## 2011-03-20 ENCOUNTER — Encounter: Payer: Self-pay | Admitting: Internal Medicine

## 2011-03-21 ENCOUNTER — Encounter: Payer: Self-pay | Admitting: Internal Medicine

## 2011-03-21 ENCOUNTER — Ambulatory Visit (INDEPENDENT_AMBULATORY_CARE_PROVIDER_SITE_OTHER): Payer: Medicare Other | Admitting: Internal Medicine

## 2011-03-21 DIAGNOSIS — E785 Hyperlipidemia, unspecified: Secondary | ICD-10-CM

## 2011-03-21 DIAGNOSIS — E079 Disorder of thyroid, unspecified: Secondary | ICD-10-CM

## 2011-03-21 DIAGNOSIS — I10 Essential (primary) hypertension: Secondary | ICD-10-CM

## 2011-03-21 DIAGNOSIS — Z0000 Encounter for general adult medical examination without abnormal findings: Secondary | ICD-10-CM
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
## 2011-03-22 ENCOUNTER — Encounter: Payer: Self-pay | Admitting: Internal Medicine

## 2011-03-22 DIAGNOSIS — E039 Hypothyroidism, unspecified: Secondary | ICD-10-CM | POA: Insufficient documentation

## 2011-03-22 DIAGNOSIS — Z0000 Encounter for general adult medical examination without abnormal findings: Secondary | ICD-10-CM | POA: Insufficient documentation

## 2011-03-22 DIAGNOSIS — I10 Essential (primary) hypertension: Secondary | ICD-10-CM | POA: Insufficient documentation

## 2011-03-22 DIAGNOSIS — E785 Hyperlipidemia, unspecified: Secondary | ICD-10-CM | POA: Insufficient documentation

## 2011-03-22 NOTE — Assessment & Plan Note (Signed)
Hypothyroid - doing well on present replacement dose.

## 2011-03-22 NOTE — Assessment & Plan Note (Signed)
Appears to be a generally healthy man. Physical exam was normal. Last colonoscopy in April of '11 and at 15 further screening in the absence of symptoms will not be necessary. Immunizations: no record of immunizations at [place] available so he is due for Tdap, pneumonia vaccine and shingles vaccine unless we can obtain records of current immunization status.   He is asked to return for routine care as needed and for a full physical exam with labs one year from his last [facility] physical

## 2011-03-22 NOTE — Assessment & Plan Note (Signed)
BP Readings from Last 3 Encounters:  03/21/11 138/80   Adequate control on hydochlorothiazide 25 mg daily  and lisinopril 40 mg daily. He has had recent blood work with normal renal function  Plan - continue present medications

## 2011-03-22 NOTE — Assessment & Plan Note (Signed)
Patient has been followed at the [place]. Last lab indicates good control on his present medical regimen.  Plan - continue present medication           Patient's option to have rechecked here or at [facility]

## 2011-03-28 ENCOUNTER — Telehealth: Payer: Self-pay | Admitting: *Deleted

## 2011-03-28 NOTE — Telephone Encounter (Signed)
Patient seen once as new patient. Go with what he says the [place] prescribed: 12.5 mg once a day OK to call in 90 day supple at [facility]

## 2011-03-28 NOTE — Telephone Encounter (Signed)
Patient requesting refill on HCTZ 25mg  tab to [facility]. EMR states that Pt takes [1] 25mg  tablet daily; Pt states that [facility] [former healthcare provider] instructions are [1/2] 12.5mg  daily and that this is the correct dosage.?

## 2011-03-29 MED ORDER — HYDROCHLOROTHIAZIDE 25 MG PO TABS: 12.5000 mg | ORAL_TABLET | Freq: Every day | ORAL | Status: DC

## 2011-03-29 NOTE — Telephone Encounter (Signed)
Pt informed rf sent in for HCTZ 25mg   1/2 qd # 45.

## 2011-03-30 NOTE — Telephone Encounter (Signed)
rx called in

## 2011-04-26 ENCOUNTER — Other Ambulatory Visit: Payer: Self-pay | Admitting: *Deleted

## 2011-04-26 MED ORDER — LISINOPRIL 40 MG PO TABS: 20.0000 mg | ORAL_TABLET | Freq: Every day | ORAL | Status: DC

## 2011-05-21 ENCOUNTER — Ambulatory Visit (INDEPENDENT_AMBULATORY_CARE_PROVIDER_SITE_OTHER): Payer: Medicare Other | Admitting: Internal Medicine

## 2011-05-21 VITALS — BP 138/70 | HR 69 | Temp 98.3°F | Wt 184.0 lb

## 2011-05-21 DIAGNOSIS — J40 Bronchitis, not specified as acute or chronic: Secondary | ICD-10-CM

## 2011-05-21 MED ORDER — AZITHROMYCIN 250 MG PO TABS: ORAL_TABLET | ORAL | Status: AC

## 2011-05-21 NOTE — Patient Instructions (Signed)
Probable bronchitis. Plan - azithromycin as directed - antibiotic. Use robitussin DM or the equivalent. Take loratadine 10 mg once a day for sinus drainage. Take sudafed 30 mg two or three times a day for congestion. Hydrate, take vitamin C.   Acute Bronchitis You have acute bronchitis. This means you have a chest cold. The airways in your lungs are inflamed (red and sore). Acute means it is sudden onset. Bronchitis is most often caused by a virus. In smokers, people with chronic lung problems, and elderly patients, treatment with antibiotics for bacterial infection may be needed. Exposure to cigarette smoke or irritating chemicals will make bronchitis worse. Allergies and asthma can also make bronchitis worse. Repeated episodes of bronchitis may cause long standing lung problems. Acute bronchitis is usually treated with rest, fluids, and medicines for relief of fever or cough. Bronchodilator medicines from metered inhalers or a nebulizer may be used to help open up the small airways. This reduces shortness of breath and helps control cough. Antibiotics can be prescribed if you are more seriously ill or at risk. A cool air vaporizer may help thin bronchial secretions and make it easier to clear your chest. Increased fluids may also help. You must avoid smoking, even second hand exposure. If you are a cigarette smoker, consider using nicotine gum or skin patches to help control withdrawal symptoms. Recovery from bronchitis is often slow, but you should start feeling better after 2-3 days. Cough from bronchitis frequently lasts for 3-4 weeks.   SEEK IMMEDIATE MEDICAL CARE IF YOU DEVELOP:  Increased fever, chills, or chest pain.   Severe shortness of breath or bloody sputum.   Dehydration, fainting, repeated vomiting, severe headache.   No improvement after one week of proper treatment.  MAKE SURE YOU:    Understand these instructions.   Will watch your condition.   Will get help right away if you  are not doing well or get worse.  Document Released: 10/04/2004 Document Re-Released: 08/09/2008 [facility] Patient Information 2011 [place], [place].
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
## 2011-05-28 ENCOUNTER — Other Ambulatory Visit: Payer: Self-pay | Admitting: *Deleted

## 2011-05-28 MED ORDER — LEVOTHYROXINE SODIUM 50 MCG PO TABS: 50.0000 ug | ORAL_TABLET | Freq: Every day | ORAL | Status: DC

## 2011-06-07 LAB — PROTIME-INR: Prothrombin Time: 13.3

## 2011-06-07 LAB — ABO/RH: ABO/RH(D): A POS

## 2011-06-07 LAB — CBC
Hemoglobin: 14.8
RDW: 13.5
WBC: 7.2

## 2011-06-07 LAB — COMPREHENSIVE METABOLIC PANEL
ALT: 35
Alkaline Phosphatase: 34 — ABNORMAL LOW
BUN: 15
CO2: 28
GFR calc non Af Amer: >60
Glucose, Bld: 87
Potassium: 3.5
Sodium: 139
Total Bilirubin: 0.9

## 2011-06-07 LAB — TYPE AND SCREEN: ABO/RH(D): A POS

## 2011-06-07 LAB — URINALYSIS, ROUTINE W REFLEX MICROSCOPIC
Bilirubin Urine: NEGATIVE
Glucose, UA: NEGATIVE
Hgb urine dipstick: NEGATIVE
Ketones, ur: NEGATIVE
Protein, ur: NEGATIVE

## 2011-07-06 ENCOUNTER — Other Ambulatory Visit: Payer: Self-pay | Admitting: Internal Medicine

## 2011-07-24 ENCOUNTER — Other Ambulatory Visit: Payer: Self-pay | Admitting: *Deleted

## 2011-07-24 MED ORDER — PRAVASTATIN SODIUM 40 MG PO TABS: 40.0000 mg | ORAL_TABLET | Freq: Every day | ORAL | Status: DC

## 2011-09-07 ENCOUNTER — Ambulatory Visit (INDEPENDENT_AMBULATORY_CARE_PROVIDER_SITE_OTHER): Payer: Medicare Other | Admitting: Internal Medicine

## 2011-09-07 ENCOUNTER — Encounter: Payer: Self-pay | Admitting: Internal Medicine

## 2011-09-07 VITALS — BP 120/62 | HR 73 | Temp 99.4°F

## 2011-09-07 DIAGNOSIS — J209 Acute bronchitis, unspecified: Secondary | ICD-10-CM

## 2011-09-07 MED ORDER — DOXYCYCLINE HYCLATE 100 MG PO TABS: 100.0000 mg | ORAL_TABLET | Freq: Two times a day (BID) | ORAL | Status: AC

## 2011-09-07 NOTE — Patient Instructions (Signed)
It was good to see you today. Use doxycycline antibiotics twice a day for one week to treat your bronchitis symptoms - Your prescription(s) have been submitted to your pharmacy. Please take as directed and contact our office if you believe you are having problem(s) with the medication(s). Also use over-the-counter plain Robitussin or Delsym syrup for cough as needed If symptoms worse or if unimproved in next 2 weeks, call for reevaluation as we discussed
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
## 2011-10-11 ENCOUNTER — Other Ambulatory Visit: Payer: Self-pay | Admitting: Internal Medicine

## 2011-10-11 NOTE — Telephone Encounter (Signed)
Done

## 2011-11-27 ENCOUNTER — Ambulatory Visit (INDEPENDENT_AMBULATORY_CARE_PROVIDER_SITE_OTHER): Payer: Medicare Other | Admitting: Endocrinology

## 2011-11-27 ENCOUNTER — Encounter: Payer: Self-pay | Admitting: Endocrinology

## 2011-11-27 VITALS — BP 120/62 | HR 72 | Temp 98.7°F

## 2011-11-27 DIAGNOSIS — J209 Acute bronchitis, unspecified: Secondary | ICD-10-CM

## 2011-11-27 MED ORDER — CEFUROXIME AXETIL 250 MG PO TABS: 250.0000 mg | ORAL_TABLET | Freq: Two times a day (BID) | ORAL | Status: AC

## 2011-11-27 MED ORDER — TRAMADOL HCL 50 MG PO TABS: 50.0000 mg | ORAL_TABLET | Freq: Four times a day (QID) | ORAL | Status: AC | PRN

## 2011-11-27 NOTE — Patient Instructions (Addendum)
i have sent 2 prescriptions to your pharmacy (for an antibiotic and cough pills).   I hope you feel better soon.  If you don't feel better by next week, please call dr Debby Bud.
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
## 2011-12-24 ENCOUNTER — Other Ambulatory Visit: Payer: Self-pay | Admitting: Internal Medicine

## 2012-03-31 ENCOUNTER — Other Ambulatory Visit: Payer: Self-pay | Admitting: Internal Medicine

## 2012-06-30 ENCOUNTER — Other Ambulatory Visit: Payer: Self-pay | Admitting: Internal Medicine

## 2012-07-02 ENCOUNTER — Other Ambulatory Visit: Payer: Self-pay | Admitting: *Deleted

## 2012-07-02 MED ORDER — LISINOPRIL 40 MG PO TABS: ORAL_TABLET | ORAL | Status: DC

## 2012-07-16 ENCOUNTER — Other Ambulatory Visit: Payer: Self-pay | Admitting: Internal Medicine

## 2012-08-25 ENCOUNTER — Other Ambulatory Visit: Payer: Self-pay | Admitting: Internal Medicine

## 2012-09-25 ENCOUNTER — Other Ambulatory Visit: Payer: Self-pay | Admitting: Internal Medicine

## 2012-10-01 ENCOUNTER — Encounter: Payer: Self-pay | Admitting: Internal Medicine

## 2012-10-01 ENCOUNTER — Other Ambulatory Visit (INDEPENDENT_AMBULATORY_CARE_PROVIDER_SITE_OTHER): Payer: Medicare Other

## 2012-10-01 ENCOUNTER — Ambulatory Visit (INDEPENDENT_AMBULATORY_CARE_PROVIDER_SITE_OTHER): Payer: Medicare Other | Admitting: Internal Medicine

## 2012-10-01 VITALS — BP 130/68 | HR 68 | Temp 99.3°F | Resp 12 | Ht 70.0 in | Wt 174.0 lb

## 2012-10-01 DIAGNOSIS — E079 Disorder of thyroid, unspecified: Secondary | ICD-10-CM

## 2012-10-01 DIAGNOSIS — E785 Hyperlipidemia, unspecified: Secondary | ICD-10-CM

## 2012-10-01 DIAGNOSIS — I10 Essential (primary) hypertension: Secondary | ICD-10-CM

## 2012-10-01 DIAGNOSIS — Z0000 Encounter for general adult medical examination without abnormal findings: Secondary | ICD-10-CM

## 2012-10-01 LAB — COMPREHENSIVE METABOLIC PANEL
AST: 26 U/L (ref 0–37)
BUN: 22 mg/dL (ref 6–23)
Calcium: 9.5 mg/dL (ref 8.4–10.5)
Chloride: 103 mEq/L (ref 96–112)
Creatinine, Ser: 0.9 mg/dL (ref 0.4–1.5)
GFR: 85.24 mL/min (ref >60.00)

## 2012-10-01 LAB — LIPID PANEL
Cholesterol: 178 mg/dL (ref 0–200)
HDL: 35.6 mg/dL — ABNORMAL LOW (ref >39.00)
Triglycerides: 255 mg/dL — ABNORMAL HIGH (ref 0.0–149.0)
VLDL: 51 mg/dL — ABNORMAL HIGH (ref 0.0–40.0)

## 2012-10-01 LAB — HEPATIC FUNCTION PANEL
ALT: 30 U/L (ref 0–53)
Bilirubin, Direct: 0 mg/dL (ref 0.0–0.3)
Total Bilirubin: 0.7 mg/dL (ref 0.3–1.2)
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
## 2012-10-01 NOTE — Patient Instructions (Addendum)
Thanks for coming in.  Your exam is normal. Lab work is ordered and the results will be in the report I will send you.  Come back in a year or sooner if needed.

## 2012-10-02 NOTE — Assessment & Plan Note (Signed)
LDL at goal of 100 or less; HDL is slightly low - can be improved with low carb diet and exercise.

## 2012-10-02 NOTE — Assessment & Plan Note (Signed)
BP Readings from Last 3 Encounters:  10/01/12 130/68  11/27/11 120/62  09/07/11 120/62   Good control on present medication. Lab results are normal  Plan Continue present medication

## 2012-10-02 NOTE — Assessment & Plan Note (Signed)
Interval history is negative for any acute illness. Physical exam is normal. Lab results are in normal range except for low HDL. He is current with colorectal cancer screening. He is aged out of prostate cancer screening. Immunizations are up to date.  In summar - a very nice man who is medically stable and takes good care of himself. He will return in 1 year.

## 2012-10-02 NOTE — Assessment & Plan Note (Signed)
Lab Results  Component Value Date   TSH 2.68 10/01/2012   Excellent control on present low dose medication. No changes

## 2012-10-08 ENCOUNTER — Other Ambulatory Visit: Payer: Self-pay | Admitting: Internal Medicine

## 2012-10-08 ENCOUNTER — Ambulatory Visit (INDEPENDENT_AMBULATORY_CARE_PROVIDER_SITE_OTHER): Payer: Medicare Other

## 2012-10-08 DIAGNOSIS — Z23 Encounter for immunization: Secondary | ICD-10-CM

## 2012-10-08 MED ORDER — TAMSULOSIN HCL 0.4 MG PO CAPS: 0.4000 mg | ORAL_CAPSULE | Freq: Every day | ORAL | Status: DC

## 2012-10-09 ENCOUNTER — Encounter (HOSPITAL_COMMUNITY): Payer: Self-pay | Admitting: Family Medicine

## 2012-10-09 ENCOUNTER — Emergency Department (HOSPITAL_COMMUNITY)
Admission: EM | Admit: 2012-10-09 | Discharge: 2012-10-09 | Disposition: A | Discharge status: Home / Self Care | Payer: Medicare Other | Attending: Emergency Medicine | Admitting: Emergency Medicine

## 2012-10-09 DIAGNOSIS — R112 Nausea with vomiting, unspecified: Secondary | ICD-10-CM | POA: Insufficient documentation

## 2012-10-09 DIAGNOSIS — E785 Hyperlipidemia, unspecified: Secondary | ICD-10-CM | POA: Insufficient documentation

## 2012-10-09 DIAGNOSIS — E039 Hypothyroidism, unspecified: Secondary | ICD-10-CM | POA: Insufficient documentation

## 2012-10-09 DIAGNOSIS — I10 Essential (primary) hypertension: Secondary | ICD-10-CM | POA: Insufficient documentation

## 2012-10-09 DIAGNOSIS — Z7982 Long term (current) use of aspirin: Secondary | ICD-10-CM | POA: Insufficient documentation

## 2012-10-09 DIAGNOSIS — Z87828 Personal history of other (healed) physical injury and trauma: Secondary | ICD-10-CM | POA: Insufficient documentation

## 2012-10-09 DIAGNOSIS — Z79899 Other long term (current) drug therapy: Secondary | ICD-10-CM | POA: Insufficient documentation

## 2012-10-09 DIAGNOSIS — R197 Diarrhea, unspecified: Secondary | ICD-10-CM | POA: Insufficient documentation

## 2012-10-09 DIAGNOSIS — Z87891 Personal history of nicotine dependence: Secondary | ICD-10-CM | POA: Insufficient documentation

## 2012-10-09 LAB — COMPREHENSIVE METABOLIC PANEL
BUN: 23 mg/dL (ref 6–23)
CO2: 24 mEq/L (ref 19–32)
Chloride: 99 mEq/L (ref 96–112)
Creatinine, Ser: 0.95 mg/dL (ref 0.50–1.35)
GFR calc non Af Amer: 77 mL/min — ABNORMAL LOW (ref >90)
Total Bilirubin: 0.5 mg/dL (ref 0.3–1.2)

## 2012-10-09 LAB — CBC WITH DIFFERENTIAL/PLATELET
Eosinophils Relative: 0 % (ref 0–5)
HCT: 46.6 % (ref 39.0–52.0)
Lymphocytes Relative: 5 % — ABNORMAL LOW (ref 12–46)
Lymphs Abs: 0.6 10*3/uL — ABNORMAL LOW (ref 0.7–4.0)
MCV: 92.8 fL (ref 78.0–100.0)
Monocytes Absolute: 0.4 10*3/uL (ref 0.1–1.0)
Monocytes Relative: 3 % (ref 3–12)
RBC: 5.02 MIL/uL (ref 4.22–5.81)
WBC: 12.7 10*3/uL — ABNORMAL HIGH (ref 4.0–10.5)

## 2012-10-09 MED ORDER — ONDANSETRON 4 MG PO TBDP: 4.0000 mg | ORAL_TABLET | Freq: Three times a day (TID) | ORAL | Status: DC | PRN

## 2012-10-09 MED ORDER — ONDANSETRON HCL 4 MG/2ML IJ SOLN
4.0000 mg | Freq: Once | INTRAMUSCULAR | Status: AC
  Administered 2012-10-09: 4 mg via INTRAVENOUS
  Filled 2012-10-09: qty 2

## 2012-10-09 MED ORDER — SODIUM CHLORIDE 0.9 % IV BOLUS (SEPSIS)
500.0000 mL | Freq: Once | INTRAVENOUS | Status: AC
  Administered 2012-10-09: 500 mL via INTRAVENOUS
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
## 2012-10-09 NOTE — ED Provider Notes (Signed)
Complains of vomiting and diarrhea and generalized weakness onset 7 PM yesterday presently 5 episodes of vomiting and 5 episodes diarrhea. Denies abdominal pain denies fever denies cough no treatment prior to coming here is improved since having received treatment in the emergency department on exam alert nontoxic lungs clear auscultation heart regular rate and rhythm abdomen normal bowel sounds, nontender.  Doug Sou, MD 10/09/12 [phone]

## 2012-10-09 NOTE — ED Notes (Signed)
Patient states that he received a flu shot on 10/08/12. States he ate a chicken sandwich from [employer] last night and started having vomiting and diarrhea around 7pm. Denies abdominal pain and cramping.

## 2012-10-10 NOTE — ED Provider Notes (Signed)
Medical screening examination/treatment/procedure(s) were conducted as a shared visit with non-physician practitioner(s) and myself.  I personally evaluated the patient during the encounter. N/V/D no sig ABD discomfort, no fevers, no syncope, some gen weakness.  On exam mildly dry mm, heart RRR, lungs CTA and ABD s/n/t/nd. Plan labs, IVFs and antiemetic with reassessment.   Sunnie Nielsen, MD 10/10/12 [phone]

## 2012-10-20 ENCOUNTER — Encounter: Payer: Self-pay | Admitting: Internal Medicine

## 2012-10-20 ENCOUNTER — Ambulatory Visit (INDEPENDENT_AMBULATORY_CARE_PROVIDER_SITE_OTHER): Payer: Medicare Other | Admitting: Internal Medicine

## 2012-10-20 VITALS — BP 116/64 | HR 74 | Temp 97.8°F | Resp 10 | Wt 172.0 lb

## 2012-10-20 DIAGNOSIS — R05 Cough: Secondary | ICD-10-CM

## 2012-10-20 DIAGNOSIS — M542 Cervicalgia: Secondary | ICD-10-CM

## 2012-10-20 MED ORDER — CYCLOBENZAPRINE HCL 5 MG PO TABS: 5.0000 mg | ORAL_TABLET | Freq: Three times a day (TID) | ORAL | Status: DC | PRN
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
## 2012-10-20 NOTE — Patient Instructions (Addendum)
1. Cough - no sign of infection or lung problems.   Plan  Try Robitussin DM or the equivalent every 4 hours as needed for cough.  May want to try Mucinex 600 mg twice a day as well to help thin secretions  If you still have coughing at night - please call back and a prescription cough syrup can be called in.  2. Neck pain - seems there is some muscle strain in your neck. This may be related to simple arthritis in the cervical(neck) spine. There is no evidence of a pinched nerve  PLan Flexeril 5 mg at bedtime - a muscle relaxant to help the pain  Tylenol 500 mg every 8 hours for pain  If not relieved by tylenol try taking Aleve every 12 hours.  Heat will help: use Aspercreme or Ben-gay rub to the sore part of the neck as often as needed.     Cough, Adult  A cough is a reflex that helps clear your throat and airways. It can help heal the body or may be a reaction to an irritated airway. A cough may only last 2 or 3 weeks (acute) or may last more than 8 weeks (chronic).  CAUSES Acute cough:  Viral or bacterial infections. Chronic cough:  Infections.  Allergies.  Asthma.  Post-nasal drip.  Smoking.  Heartburn or acid reflux.  Some medicines.  Chronic lung problems (COPD).  Cancer. SYMPTOMS   Cough.  Fever.  Chest pain.  Increased breathing rate.  High-pitched whistling sound when breathing (wheezing).  Colored mucus that you cough up (sputum). TREATMENT   A bacterial cough may be treated with antibiotic medicine.  A viral cough must run its course and will not respond to antibiotics.  Your caregiver may recommend other treatments if you have a chronic cough. HOME CARE INSTRUCTIONS   Only take over-the-counter or prescription medicines for pain, discomfort, or fever as directed by your caregiver. Use cough suppressants only as directed by your caregiver.  Use a cold steam vaporizer or humidifier in your bedroom or home to help loosen secretions.  Sleep in  a semi-upright position if your cough is worse at night.  Rest as needed.  Stop smoking if you smoke. SEEK IMMEDIATE MEDICAL CARE IF:   You have pus in your sputum.  Your cough starts to worsen.  You cannot control your cough with suppressants and are losing sleep.  You begin coughing up blood.  You have difficulty breathing.  You develop pain which is getting worse or is uncontrolled with medicine.  You have a fever. MAKE SURE YOU:   Understand these instructions.  Will watch your condition.  Will get help right away if you are not doing well or get worse. Document Released: 02/23/2011 Document Revised: 11/19/2011 Document Reviewed: 02/23/2011 Erik Jackson Patient Information 2013 [place], [place].   Cervical Sprain A cervical sprain is an injury in the neck in which the ligaments are stretched or torn. The ligaments are the tissues that hold the bones of the neck (vertebrae) in place.Cervical sprains can range from very mild to very severe. Most cervical sprains get better in 1 to 3 weeks, but it depends on the cause and extent of the injury. Severe cervical sprains can cause the neck vertebrae to be unstable. This can lead to damage of the spinal cord and can result in serious nervous system problems. Your caregiver will determine whether your cervical sprain is mild or severe. CAUSES  Severe cervical sprains may be caused by:  Contact sport injuries (football, rugby, wrestling, hockey, auto racing, gymnastics, diving, martial arts, boxing).  Motor vehicle collisions.  Whiplash injuries. This means the neck is forcefully whipped backward and forward.  Falls. Mild cervical sprains may be caused by:   Awkward positions, such as cradling a telephone between your ear and shoulder.  Sitting in a chair that does not offer proper support.  Working at a poorly Marketing executive station.  Activities that require looking up or down for long periods of time. SYMPTOMS   Pain,  soreness, stiffness, or a burning sensation in the front, back, or sides of the neck. This discomfort may develop immediately after injury or it may develop slowly and not begin for 24 hours or more after an injury.  Pain or tenderness directly in the middle of the back of the neck.  Shoulder or upper back pain.  Limited ability to move the neck.  Headache.  Dizziness.  Weakness, numbness, or tingling in the hands or arms.  Muscle spasms.  Difficulty swallowing or chewing.  Tenderness and swelling of the neck. DIAGNOSIS  Most of the time, your caregiver can diagnose this problem by taking your history and doing a physical exam. Your caregiver will ask about any known problems, such as arthritis in the neck or a previous neck injury. X-rays may be taken to find out if there are any other problems, such as problems with the bones of the neck. However, an X-ray often does not reveal the full extent of a cervical sprain. Other tests such as a computed tomography (CT) scan or magnetic resonance imaging (MRI) may be needed. TREATMENT  Treatment depends on the severity of the cervical sprain. Mild sprains can be treated with rest, keeping the neck in place (immobilization), and pain medicines. Severe cervical sprains need immediate immobilization and an appointment with an orthopedist or neurosurgeon. Several treatment options are available to help with pain, muscle spasms, and other symptoms. Your caregiver may prescribe:  Medicines, such as pain relievers, numbing medicines, or muscle relaxants.  Physical therapy. This can include stretching exercises, strengthening exercises, and posture training. Exercises and improved posture can help stabilize the neck, strengthen muscles, and help stop symptoms from returning.  A neck collar to be worn for short periods of time. Often, these collars are worn for comfort. However, certain collars may be worn to protect the neck and prevent further  worsening of a serious cervical sprain. HOME CARE INSTRUCTIONS   Put ice on the injured area.  Put ice in a plastic bag.  Place a towel between your skin and the bag.  Leave the ice on for 15 to 20 minutes, 3 to 4 times a day.  Only take over-the-counter or prescription medicines for pain, discomfort, or fever as directed by your caregiver.  Keep all follow-up appointments as directed by your caregiver.  Keep all physical therapy appointments as directed by your caregiver.  If a neck collar is prescribed, wear it as directed by your caregiver.  Do not drive while wearing a neck collar.  Make any needed adjustments to your work station to promote good posture.  Avoid positions and activities that make your symptoms worse.  Warm up and stretch before being active to help prevent problems. SEEK MEDICAL CARE IF:   Your pain is not controlled with medicine.  You are unable to decrease your pain medicine over time as planned.  Your activity level is not improving as expected. SEEK IMMEDIATE MEDICAL CARE IF:   You  develop any bleeding, stomach upset, or signs of an allergic reaction to your medicine.  Your symptoms get worse.  You develop new, unexplained symptoms.  You have numbness, tingling, weakness, or paralysis in any part of your body. MAKE SURE YOU:   Understand these instructions.  Will watch your condition.  Will get help right away if you are not doing well or get worse. Document Released: 06/24/2007 Document Revised: 11/19/2011 Document Reviewed: 05/30/2011 [facility] Patient Information 2013 [place], [place].

## 2012-11-25 ENCOUNTER — Other Ambulatory Visit: Payer: Self-pay | Admitting: Internal Medicine

## 2012-12-09 ENCOUNTER — Other Ambulatory Visit: Payer: Self-pay | Admitting: Internal Medicine

## 2012-12-18 ENCOUNTER — Other Ambulatory Visit: Payer: Self-pay | Admitting: Internal Medicine

## 2013-02-17 ENCOUNTER — Other Ambulatory Visit: Payer: Self-pay | Admitting: *Deleted

## 2013-02-17 ENCOUNTER — Ambulatory Visit (INDEPENDENT_AMBULATORY_CARE_PROVIDER_SITE_OTHER): Payer: Medicare Other | Admitting: Internal Medicine

## 2013-02-17 ENCOUNTER — Encounter: Payer: Self-pay | Admitting: Internal Medicine

## 2013-02-17 VITALS — BP 142/82 | HR 64 | Temp 98.1°F

## 2013-02-17 DIAGNOSIS — M109 Gout, unspecified: Secondary | ICD-10-CM

## 2013-02-17 DIAGNOSIS — R42 Dizziness and giddiness: Secondary | ICD-10-CM

## 2013-02-17 MED ORDER — FEBUXOSTAT 40 MG PO TABS: 80.0000 mg | ORAL_TABLET | Freq: Every day | ORAL | Status: DC

## 2013-02-17 MED ORDER — INDOMETHACIN 25 MG PO CAPS: 25.0000 mg | ORAL_CAPSULE | Freq: Two times a day (BID) | ORAL | Status: DC

## 2013-02-17 MED ORDER — MECLIZINE HCL 25 MG PO TABS: 25.0000 mg | ORAL_TABLET | Freq: Three times a day (TID) | ORAL | Status: DC | PRN

## 2013-02-17 NOTE — Patient Instructions (Signed)
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
## 2013-02-19 ENCOUNTER — Other Ambulatory Visit: Payer: Self-pay

## 2013-02-19 MED ORDER — LISINOPRIL 20 MG PO TABS: 20.0000 mg | ORAL_TABLET | Freq: Every day | ORAL | Status: DC

## 2013-03-09 ENCOUNTER — Other Ambulatory Visit: Payer: Self-pay | Admitting: Internal Medicine

## 2013-03-09 MED ORDER — HYDROCHLOROTHIAZIDE 12.5 MG PO TABS: 12.5000 mg | ORAL_TABLET | Freq: Every day | ORAL | Status: DC

## 2013-03-25 ENCOUNTER — Other Ambulatory Visit: Payer: Self-pay | Admitting: Internal Medicine

## 2013-04-21 ENCOUNTER — Other Ambulatory Visit: Payer: Self-pay | Admitting: Internal Medicine

## 2013-05-20 ENCOUNTER — Other Ambulatory Visit: Payer: Self-pay | Admitting: Internal Medicine

## 2013-07-29 ENCOUNTER — Other Ambulatory Visit: Payer: Self-pay | Admitting: Internal Medicine

## 2013-08-24 ENCOUNTER — Other Ambulatory Visit: Payer: Self-pay | Admitting: Internal Medicine

## 2013-11-19 ENCOUNTER — Other Ambulatory Visit: Payer: Self-pay | Admitting: Internal Medicine

## 2013-12-01 ENCOUNTER — Encounter: Payer: Self-pay | Admitting: Internal Medicine

## 2013-12-01 ENCOUNTER — Ambulatory Visit (INDEPENDENT_AMBULATORY_CARE_PROVIDER_SITE_OTHER): Payer: Medicare Other | Admitting: Internal Medicine

## 2013-12-01 ENCOUNTER — Other Ambulatory Visit (INDEPENDENT_AMBULATORY_CARE_PROVIDER_SITE_OTHER): Payer: Medicare Other

## 2013-12-01 VITALS — BP 144/70 | HR 69 | Temp 98.2°F | Ht 69.0 in | Wt 177.0 lb

## 2013-12-01 DIAGNOSIS — E079 Disorder of thyroid, unspecified: Secondary | ICD-10-CM

## 2013-12-01 DIAGNOSIS — M109 Gout, unspecified: Secondary | ICD-10-CM

## 2013-12-01 DIAGNOSIS — I10 Essential (primary) hypertension: Secondary | ICD-10-CM

## 2013-12-01 DIAGNOSIS — E785 Hyperlipidemia, unspecified: Secondary | ICD-10-CM

## 2013-12-01 DIAGNOSIS — Z0000 Encounter for general adult medical examination without abnormal findings: Secondary | ICD-10-CM

## 2013-12-01 DIAGNOSIS — Z23 Encounter for immunization: Secondary | ICD-10-CM

## 2013-12-01 LAB — LIPID PANEL
CHOLESTEROL: 180 mg/dL (ref 0–200)
HDL: 36.7 mg/dL — ABNORMAL LOW (ref >39.00)
LDL Cholesterol: 100 mg/dL — ABNORMAL HIGH (ref 0–99)
TRIGLYCERIDES: 219 mg/dL — AB (ref 0.0–149.0)
Total CHOL/HDL Ratio: 5
VLDL: 43.8 mg/dL — ABNORMAL HIGH (ref 0.0–40.0)

## 2013-12-01 LAB — COMPREHENSIVE METABOLIC PANEL
ALBUMIN: 4.5 g/dL (ref 3.5–5.2)
ALT: 35 U/L (ref 0–53)
AST: 28 U/L (ref 0–37)
Alkaline Phosphatase: 39 U/L (ref 39–117)
BILIRUBIN TOTAL: 0.8 mg/dL (ref 0.3–1.2)
BUN: 20 mg/dL (ref 6–23)
CO2: 30 meq/L (ref 19–32)
Calcium: 9.5 mg/dL (ref 8.4–10.5)
Chloride: 99 mEq/L (ref 96–112)
Creatinine, Ser: 1 mg/dL (ref 0.4–1.5)
GFR: 73.67 mL/min (ref >60.00)
GLUCOSE: 96 mg/dL (ref 70–99)
POTASSIUM: 4.2 meq/L (ref 3.5–5.1)
SODIUM: 137 meq/L (ref 135–145)
TOTAL PROTEIN: 8 g/dL (ref 6.0–8.3)

## 2013-12-01 LAB — TSH: TSH: 3.8 u[IU]/mL (ref 0.35–5.50)

## 2013-12-01 LAB — URIC ACID: URIC ACID, SERUM: 7.5 mg/dL (ref 4.0–7.8)

## 2013-12-01 MED ORDER — PRAVASTATIN SODIUM 40 MG PO TABS: 40.0000 mg | ORAL_TABLET | Freq: Every evening | ORAL | Status: DC

## 2013-12-01 MED ORDER — HYDROCHLOROTHIAZIDE 12.5 MG PO TABS: 12.5000 mg | ORAL_TABLET | Freq: Every day | ORAL | Status: DC

## 2013-12-01 MED ORDER — TAMSULOSIN HCL 0.4 MG PO CAPS: ORAL_CAPSULE | ORAL | Status: DC

## 2013-12-01 MED ORDER — LEVOTHYROXINE SODIUM 50 MCG PO TABS: ORAL_TABLET | ORAL | Status: DC

## 2013-12-01 MED ORDER — LISINOPRIL 20 MG PO TABS: ORAL_TABLET | ORAL | Status: DC

## 2013-12-01 NOTE — Progress Notes (Signed)
Subjective:    Patient ID: Erik Jackson, male    DOB: 1931/02/14, 80 y.o.   MRN: [id number]  HPI The patient is here for annual Medicare wellness examination and management of other chronic and acute problems.  Has no problems and feels well. He does have paresthesia both hands, does awaken him from sleep.   The risk factors are reflected in the social history.  The roster of all physicians providing medical care to patient - is listed in the Snapshot section of the chart.  Activities of daily living:  The patient is 100% inedpendent in all ADLs: dressing, toileting, feeding as well as independent mobility  Home safety : The patient has smoke detectors in the home. Fall - none. They wear seatbelts. No firearms at home . There is no violence in the home.   There is no risks for hepatitis, STDs or HIV. There is no history of blood transfusion. They have no travel history to infectious disease endemic areas of the world.  The patient has not seen their dentist in the last six month-has false teeth. They have not seen their eye doctor in the last year. They admit to any hearing difficulty and have not had audiologic testing in the last year.    Quincy Carnes do not  have excessive sun exposure. Discussed the need for sun protection: hats, long sleeves and use of sunscreen if there is significant sun exposure.   Diet: the importance of a healthy diet is discussed. They do have a healthy diet.  The patient has no regular exercise program.  The benefits of regular aerobic exercise were discussed.  Depression screen: there are no signs or vegative symptoms of depression- irritability, change in appetite, anhedonia, sadness/tearfullness.  Cognitive assessment: the patient manages all their financial and personal affairs and is actively engaged.   The following portions of the patient's history were reviewed and updated as appropriate: allergies, current medications, past family history, past medical  history,  past surgical history, past social history  and problem list.  Vision, hearing, body mass index were assessed and reviewed.   During the course of the visit the patient was educated and counseled about appropriate screening and preventive services including : fall prevention , diabetes screening, nutrition counseling, colorectal cancer screening, and recommended immunizations.    Review of Systems Constitutional:  Negative for fever, chills, activity change and unexpected weight change.  HEENT:  Negative for hearing loss, ear pain, congestion, neck stiffness and postnasal drip. Negative for sore throat or swallowing problems. Negative for dental complaints.   Eyes: Negative for vision loss or change in visual acuity.  Respiratory: Negative for chest tightness and wheezing. Negative for DOE.   Cardiovascular: Negative for chest pain or palpitations. No decreased exercise tolerance Gastrointestinal: No change in bowel habit. No bloating or gas. No reflux or indigestion Genitourinary: Negative for urgency, frequency, flank pain and difficulty urinating. Nocturia x 2 Musculoskeletal: Negative for myalgias, back pain, arthralgias and gait problem.  Neurological: Negative for dizziness, tremors, weakness and headaches.  Hematological: Negative for adenopathy.  Psychiatric/Behavioral: Negative for behavioral problems and dysphoric mood.       Objective:   Physical Exam Filed Vitals:   12/01/13 1326  BP: 144/70  Pulse: 69  Temp: 98.2 F (36.8 C)   Wt Readings from Last 3 Encounters:  12/01/13 177 lb (80.287 kg)  10/20/12 172 lb (78.019 kg)  10/01/12 174 lb (78.926 kg)   Gen'l: Well nourished well developed  male in  no acute distress  HEENT: Head: Normocephalic and atraumatic. Right Ear: External ear normal. EAC w/ hearing aid/TM nl. Left Ear: External ear normal.  EAC w/ hearing aide/TM nl. Nose: Nose normal. Mouth/Throat: Oropharynx is clear and moist. Dentition -full  dentures top, partial bottom. No buccal or palatal lesions. Posterior pharynx clear. Eyes: Conjunctivae and sclera clear. EOM intact. Pupils are equal, round, and reactive to light. Right eye exhibits no discharge. Left eye exhibits no discharge. Neck: Normal range of motion. Neck supple. No JVD present. No tracheal deviation present. No thyromegaly present.  Cardiovascular: Normal rate, regular rhythm, no gallop, no friction rub, no murmur heard.      Quiet precordium. 2+ radial and DP pulses . No carotid bruits Pulmonary/Chest: Effort normal. No respiratory distress or increased WOB, no wheezes, no rales. No chest wall deformity or CVAT. Abdomen: Soft. Bowel sounds are normal in all quadrants. He exhibits no distension, no tenderness, no rebound or guarding, No heptosplenomegaly  Genitourinary:   Musculoskeletal: Normal range of motion. He exhibits no edema and no tenderness.       Small and large joints without redness, synovial thickening or deformity. Full range of motion preserved about all small, median and large joints.  Lymphadenopathy:    He has no cervical or supraclavicular adenopathy.  Neurological: He is alert and oriented to person, place, and time. CN II-XII intact. DTRs 2+ and symmetrical biceps, radial and patellar tendons. Cerebellar function normal with no tremor, rigidity, normal gait and station.  Skin: Skin is warm and dry. No rash noted. No erythema.  Psychiatric: He has a normal mood and affect. His behavior is normal. Thought content normal.   Recent Results (from the past 2160 hour(s))  COMPREHENSIVE METABOLIC PANEL     Status: None   Collection Time    12/01/13  3:27 PM      Result Value Ref Range   Sodium 137  135 - 145 mEq/L   Potassium 4.2  3.5 - 5.1 mEq/L   Chloride 99  96 - 112 mEq/L   CO2 30  19 - 32 mEq/L   Glucose, Bld 96  70 - 99 mg/dL   BUN 20  6 - 23 mg/dL   Creatinine, Ser 1.0  0.4 - 1.5 mg/dL   Total Bilirubin 0.8  0.3 - 1.2 mg/dL   Alkaline  Phosphatase 39  39 - 117 U/L   AST 28  0 - 37 U/L   ALT 35  0 - 53 U/L   Total Protein 8.0  6.0 - 8.3 g/dL   Albumin 4.5  3.5 - 5.2 g/dL   Calcium 9.5  8.4 - 21.3 mg/dL   GFR 08.65  >78.46 mL/min  LIPID PANEL     Status: Abnormal   Collection Time    12/01/13  3:27 PM      Result Value Ref Range   Cholesterol 180  0 - 200 mg/dL   Comment: ATP III Classification       Desirable:  < 200 mg/dL               Borderline High:  200 - 239 mg/dL          High:  > = 962 mg/dL   Triglycerides 952.8 (*) 0.0 - 149.0 mg/dL   Comment: Normal:  <413 mg/dLBorderline High:  150 - 199 mg/dL   HDL 24.40 (*) >10.27 mg/dL   VLDL 25.3 (*) 0.0 - 66.4 mg/dL   LDL Cholesterol 403 (*) 0 -  99 mg/dL   Total CHOL/HDL Ratio 5     Comment:                Men          Women1/2 Average Risk     3.4          3.3Average Risk          5.0          4.42X Average Risk          9.6          7.13X Average Risk          15.0          11.0                      TSH     Status: None   Collection Time    12/01/13  3:27 PM      Result Value Ref Range   TSH 3.80  0.35 - 5.50 uIU/mL  URIC ACID     Status: None   Collection Time    12/01/13  3:27 PM      Result Value Ref Range   Uric Acid, Serum 7.5  4.0 - 7.8 mg/dL         Assessment & Plan:

## 2013-12-01 NOTE — Patient Instructions (Signed)
Thanks for coming in. Your new doctor will be Dr. Jonny RuizJohn, same as your wife.  Your exam today is good. Routine labs are ordered for you and the results will be mailed to you.  All your prescriptions will be renewed today for a year.  Immunizations - you are up to date and will get Prevnar today, once and done.

## 2013-12-01 NOTE — Progress Notes (Signed)
Pre visit review using our clinic review tool, if applicable. No additional management support is needed unless otherwise documented below in the visit note. 

## 2013-12-04 ENCOUNTER — Encounter: Payer: Self-pay | Admitting: Internal Medicine

## 2013-12-05 NOTE — Assessment & Plan Note (Signed)
Taking and tolerating "statin" therapy. LDL at goal, HDL close to goal. Liver functions normal.  Plan Continue pravastatin.

## 2013-12-05 NOTE — Assessment & Plan Note (Signed)
No recent flares. Uric acid is high end of normal range.  Plan - for 2+ flares in 12 month period will add allopurinol.

## 2013-12-05 NOTE — Assessment & Plan Note (Signed)
Lab Results  Component Value Date   TSH 3.80 12/01/2013   Normal range TSH on current dose of levothyroxine.

## 2013-12-05 NOTE — Assessment & Plan Note (Signed)
Interval history is normal. He does continue to go to the [facility] for med[facility]. He is current with colon cancer screening. Immunizations are up to date. He will return in 6 months for routine follow up.

## 2013-12-05 NOTE — Assessment & Plan Note (Signed)
BP Readings from Last 3 Encounters:  12/01/13 144/70  02/17/13 142/82  10/20/12 116/64   Adequate control on present medications. Bmet normal  Plan Continue present regimen

## 2013-12-16 ENCOUNTER — Ambulatory Visit (INDEPENDENT_AMBULATORY_CARE_PROVIDER_SITE_OTHER): Payer: Medicare Other | Admitting: Internal Medicine

## 2013-12-16 ENCOUNTER — Encounter: Payer: Self-pay | Admitting: Internal Medicine

## 2013-12-16 ENCOUNTER — Ambulatory Visit (INDEPENDENT_AMBULATORY_CARE_PROVIDER_SITE_OTHER)
Admission: RE | Admit: 2013-12-16 | Discharge: 2013-12-16 | Disposition: A | Discharge status: Home / Self Care | Payer: Medicare Other | Source: Ambulatory Visit | Attending: Internal Medicine | Admitting: Internal Medicine

## 2013-12-16 VITALS — BP 120/50 | HR 73 | Temp 99.0°F | Resp 15 | Wt 175.0 lb

## 2013-12-16 DIAGNOSIS — J209 Acute bronchitis, unspecified: Secondary | ICD-10-CM

## 2013-12-16 DIAGNOSIS — R509 Fever, unspecified: Secondary | ICD-10-CM

## 2013-12-16 MED ORDER — AZITHROMYCIN 250 MG PO TABS: ORAL_TABLET | ORAL | Status: DC

## 2013-12-16 NOTE — Progress Notes (Signed)
   Subjective:    Patient ID: Erik Jackson, male    DOB: 06/06/1931, 80 y.o.   MRN: [id number]  HPI  Symptoms began 6 days ago with a rattly, nonproductive cough. Paroxysms last less than a minute.  He has been using Mucinex DM with partial benefit. He's had associated wheezing and some shortness of breath.  He had a similar episode 2 months ago which resolved without treatment.  He smoked for 35 years at one half pack per day and had recurrent bronchitis during that time.  He is on ACE inhibitor  Review of Systems   He specifically denies any upper respiratory tract symptoms. There is no associated itchy, watery eyes, fever, chills, sweats, nasal purulence, sinus pain, or sneezing.  He also denies any significant reflux symptoms.        Objective:   Physical Exam General appearance:good health ;well nourished; no acute distress or increased work of breathing is present.  No  lymphadenopathy about the head, neck, or axilla noted.   Eyes: No conjunctival inflammation or lid edema is present. There is no scleral icterus.  Ears:  Very poor hearing despite aids  Nose:  External nasal examination shows no deformity or inflammation. Nasal mucosa are dry without lesions or exudates. No septal dislocation or deviation.No obstruction to airflow. Hyponasal speech  Oral exam: Denture upper; lips and gums are healthy appearing.There is no oropharyngeal erythema or exudate noted.   Neck:  No deformities,  masses, or tenderness noted.    Heart:  Normal rate and regular rhythm. S1 and S2 normal without gallop, murmur, click, rub or other extra sounds.   Lungs: Very low-grade rhonchi/rales left lower lobe greater than right lower lobe.No increased work of breathing.    Extremities:  No cyanosis, edema, or clubbing  noted    Skin: Warm & dry w/o jaundice or tenting.         Assessment & Plan:  #1 acute bronchitis w/o bronchospasm #2low grade fever Plan: See orders and recommendations

## 2013-12-16 NOTE — Patient Instructions (Signed)
Carry room temperature water and sip liberally after coughing.  Advair samples one inhalation every 12 hours; gargle and spit after use

## 2013-12-16 NOTE — Progress Notes (Signed)
Pre visit review using our clinic review tool, if applicable. No additional management support is needed unless otherwise documented below in the visit note. 

## 2013-12-18 ENCOUNTER — Encounter: Payer: Self-pay | Admitting: *Deleted

## 2014-04-20 ENCOUNTER — Other Ambulatory Visit: Payer: Self-pay

## 2014-04-20 MED ORDER — LEVOTHYROXINE SODIUM 50 MCG PO TABS: ORAL_TABLET | ORAL | Status: DC

## 2014-11-30 ENCOUNTER — Other Ambulatory Visit: Payer: Self-pay

## 2014-11-30 MED ORDER — TAMSULOSIN HCL 0.4 MG PO CAPS: ORAL_CAPSULE | ORAL | Status: DC

## 2014-12-17 ENCOUNTER — Encounter: Payer: Self-pay | Admitting: Internal Medicine

## 2014-12-17 ENCOUNTER — Other Ambulatory Visit (INDEPENDENT_AMBULATORY_CARE_PROVIDER_SITE_OTHER): Payer: Medicare Other

## 2014-12-17 ENCOUNTER — Ambulatory Visit (INDEPENDENT_AMBULATORY_CARE_PROVIDER_SITE_OTHER): Payer: Medicare Other | Admitting: Internal Medicine

## 2014-12-17 ENCOUNTER — Other Ambulatory Visit: Payer: Self-pay

## 2014-12-17 VITALS — BP 124/60 | HR 77 | Temp 98.7°F | Resp 18 | Ht 69.0 in | Wt 180.1 lb

## 2014-12-17 DIAGNOSIS — I10 Essential (primary) hypertension: Secondary | ICD-10-CM

## 2014-12-17 DIAGNOSIS — Z0000 Encounter for general adult medical examination without abnormal findings: Secondary | ICD-10-CM

## 2014-12-17 DIAGNOSIS — R351 Nocturia: Secondary | ICD-10-CM | POA: Diagnosis not present

## 2014-12-17 DIAGNOSIS — E785 Hyperlipidemia, unspecified: Secondary | ICD-10-CM

## 2014-12-17 DIAGNOSIS — R7989 Other specified abnormal findings of blood chemistry: Secondary | ICD-10-CM

## 2014-12-17 DIAGNOSIS — R21 Rash and other nonspecific skin eruption: Secondary | ICD-10-CM | POA: Insufficient documentation

## 2014-12-17 LAB — URINALYSIS, ROUTINE W REFLEX MICROSCOPIC
BILIRUBIN URINE: NEGATIVE
HGB URINE DIPSTICK: NEGATIVE
KETONES UR: NEGATIVE
LEUKOCYTES UA: NEGATIVE
Nitrite: NEGATIVE
RBC / HPF: NONE SEEN (ref 0–?)
Specific Gravity, Urine: 1.015 (ref 1.000–1.030)
Total Protein, Urine: NEGATIVE
UROBILINOGEN UA: 0.2 (ref 0.0–1.0)
Urine Glucose: NEGATIVE
WBC, UA: NONE SEEN (ref 0–?)
pH: 6 (ref 5.0–8.0)

## 2014-12-17 LAB — HEPATIC FUNCTION PANEL
ALK PHOS: 36 U/L — AB (ref 39–117)
ALT: 35 U/L (ref 0–53)
AST: 25 U/L (ref 0–37)
Albumin: 4.4 g/dL (ref 3.5–5.2)
Bilirubin, Direct: 0.1 mg/dL (ref 0.0–0.3)
Total Bilirubin: 0.5 mg/dL (ref 0.2–1.2)
Total Protein: 7.5 g/dL (ref 6.0–8.3)

## 2014-12-17 LAB — BASIC METABOLIC PANEL
BUN: 16 mg/dL (ref 6–23)
CALCIUM: 9.8 mg/dL (ref 8.4–10.5)
CO2: 28 meq/L (ref 19–32)
CREATININE: 0.91 mg/dL (ref 0.40–1.50)
Chloride: 100 mEq/L (ref 96–112)
GFR: 84.77 mL/min (ref >60.00)
GLUCOSE: 109 mg/dL — AB (ref 70–99)
Potassium: 4.2 mEq/L (ref 3.5–5.1)
Sodium: 137 mEq/L (ref 135–145)

## 2014-12-17 LAB — CBC WITH DIFFERENTIAL/PLATELET
Basophils Absolute: 0 10*3/uL (ref 0.0–0.1)
Basophils Relative: 0.4 % (ref 0.0–3.0)
EOS PCT: 3.5 % (ref 0.0–5.0)
Eosinophils Absolute: 0.2 10*3/uL (ref 0.0–0.7)
HCT: 39.9 % (ref 39.0–52.0)
Hemoglobin: 13.5 g/dL (ref 13.0–17.0)
Lymphocytes Relative: 31.3 % (ref 12.0–46.0)
Lymphs Abs: 2 10*3/uL (ref 0.7–4.0)
MCHC: 33.9 g/dL (ref 30.0–36.0)
MCV: 94.5 fl (ref 78.0–100.0)
Monocytes Absolute: 0.5 10*3/uL (ref 0.1–1.0)
Monocytes Relative: 7.7 % (ref 3.0–12.0)
NEUTROS ABS: 3.7 10*3/uL (ref 1.4–7.7)
Neutrophils Relative %: 57.1 % (ref 43.0–77.0)
PLATELETS: 239 10*3/uL (ref 150.0–400.0)
RBC: 4.22 Mil/uL (ref 4.22–5.81)
RDW: 14.9 % (ref 11.5–15.5)
WBC: 6.5 10*3/uL (ref 4.0–10.5)

## 2014-12-17 LAB — LIPID PANEL
CHOL/HDL RATIO: 5
CHOLESTEROL: 179 mg/dL (ref 0–200)
HDL: 35.9 mg/dL — AB (ref >39.00)
NonHDL: 143.1
TRIGLYCERIDES: 315 mg/dL — AB (ref 0.0–149.0)
VLDL: 63 mg/dL — ABNORMAL HIGH (ref 0.0–40.0)

## 2014-12-17 LAB — TSH: TSH: 4.02 u[IU]/mL (ref 0.35–4.50)

## 2014-12-17 LAB — PSA: PSA: 0.64 ng/mL (ref 0.10–4.00)

## 2014-12-17 LAB — LDL CHOLESTEROL, DIRECT: Direct LDL: 102 mg/dL

## 2014-12-17 MED ORDER — TAMSULOSIN HCL 0.4 MG PO CAPS: 0.4000 mg | ORAL_CAPSULE | Freq: Two times a day (BID) | ORAL | Status: DC

## 2014-12-17 MED ORDER — LEVOTHYROXINE SODIUM 50 MCG PO TABS: ORAL_TABLET | ORAL | Status: DC

## 2014-12-17 MED ORDER — TRIAMCINOLONE ACETONIDE 0.1 % EX CREA: 1.0000 "application " | TOPICAL_CREAM | Freq: Two times a day (BID) | CUTANEOUS | Status: DC

## 2014-12-17 MED ORDER — LISINOPRIL 20 MG PO TABS: ORAL_TABLET | ORAL | Status: DC

## 2014-12-17 MED ORDER — HYDROCHLOROTHIAZIDE 12.5 MG PO TABS: 12.5000 mg | ORAL_TABLET | Freq: Every day | ORAL | Status: DC

## 2014-12-17 MED ORDER — PRAVASTATIN SODIUM 40 MG PO TABS: 40.0000 mg | ORAL_TABLET | Freq: Every evening | ORAL | Status: DC

## 2014-12-17 NOTE — Progress Notes (Signed)
Pre visit review using our clinic review tool, if applicable. No additional management support is needed unless otherwise documented below in the visit note. 

## 2014-12-17 NOTE — Assessment & Plan Note (Signed)
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
## 2014-12-17 NOTE — Assessment & Plan Note (Signed)
C/w Chronic dermatitis, for triam cr prn

## 2014-12-17 NOTE — Patient Instructions (Signed)
OK to increase the flomax (tamsulosin) to twice per day  Please take all new medication as prescribed - the cream for the leg  Please continue all other medications as before, and refills have been done if requested.  Please have the pharmacy call with any other refills you may need.  Please continue your efforts at being more active, low cholesterol diet, and weight control.  You are otherwise up to date with prevention measures today.  Please keep your appointments with your specialists as you may have planned  You will be contacted regarding the referral for: urology  Please go to the LAB in the Basement (turn left off the elevator) for the tests to be done today  You will be contacted by phone if any changes need to be made immediately.  Otherwise, you will receive a letter about your results with an explanation, but please check with MyChart first.  Please remember to sign up for MyChart if you have not done so, as this will be important to you in the future with finding out test results, communicating by private email, and scheduling acute appointments online when needed.  Please return in 6 months, or sooner if needed

## 2014-12-17 NOTE — Assessment & Plan Note (Signed)
Ok for increased flomax to bid, also urology referral, for UA with labs today

## 2014-12-28 ENCOUNTER — Other Ambulatory Visit: Payer: Self-pay | Admitting: *Deleted

## 2014-12-28 MED ORDER — HYDROCHLOROTHIAZIDE 12.5 MG PO TABS: 12.5000 mg | ORAL_TABLET | Freq: Every day | ORAL | Status: DC

## 2015-02-03 ENCOUNTER — Encounter: Payer: Self-pay | Admitting: Gastroenterology

## 2015-02-09 DIAGNOSIS — N320 Bladder-neck obstruction: Secondary | ICD-10-CM | POA: Diagnosis not present

## 2015-02-09 DIAGNOSIS — R3912 Poor urinary stream: Secondary | ICD-10-CM | POA: Diagnosis not present

## 2015-02-09 DIAGNOSIS — R351 Nocturia: Secondary | ICD-10-CM | POA: Diagnosis not present

## 2015-05-11 DIAGNOSIS — N320 Bladder-neck obstruction: Secondary | ICD-10-CM | POA: Diagnosis not present

## 2015-05-12 ENCOUNTER — Encounter (HOSPITAL_BASED_OUTPATIENT_CLINIC_OR_DEPARTMENT_OTHER): Payer: Self-pay | Admitting: *Deleted

## 2015-05-12 ENCOUNTER — Other Ambulatory Visit: Payer: Self-pay | Admitting: Urology

## 2015-05-12 NOTE — Progress Notes (Signed)
SPOKE W/ WIFE, PT IS VERY HOH OVER PHONE.  NPO AFTER MN.  ARRIVE AT [id number].  NEEDS ISTAT AND EKG.  WILL TAKE SYNTHROID AM DOS W/ SIPS OF WATER.  PT AWARE OWER AT MAIN.
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
## 2015-05-19 ENCOUNTER — Encounter (HOSPITAL_COMMUNITY)
Admission: RE | Disposition: A | Discharge status: Home / Self Care | Payer: Self-pay | Source: Ambulatory Visit | Attending: Urology

## 2015-05-19 ENCOUNTER — Ambulatory Visit (HOSPITAL_BASED_OUTPATIENT_CLINIC_OR_DEPARTMENT_OTHER)
Admission: RE | Admit: 2015-05-19 | Discharge: 2015-05-20 | Disposition: A | Discharge status: Home / Self Care | Payer: Medicare Other | Source: Ambulatory Visit | Attending: Urology | Admitting: Urology

## 2015-05-19 ENCOUNTER — Ambulatory Visit (HOSPITAL_BASED_OUTPATIENT_CLINIC_OR_DEPARTMENT_OTHER): Payer: Medicare Other | Admitting: Anesthesiology

## 2015-05-19 ENCOUNTER — Encounter (HOSPITAL_BASED_OUTPATIENT_CLINIC_OR_DEPARTMENT_OTHER): Payer: Self-pay | Admitting: *Deleted

## 2015-05-19 DIAGNOSIS — N39498 Other specified urinary incontinence: Secondary | ICD-10-CM | POA: Diagnosis not present

## 2015-05-19 DIAGNOSIS — H9193 Unspecified hearing loss, bilateral: Secondary | ICD-10-CM | POA: Diagnosis not present

## 2015-05-19 DIAGNOSIS — R3912 Poor urinary stream: Secondary | ICD-10-CM | POA: Insufficient documentation

## 2015-05-19 DIAGNOSIS — Z96641 Presence of right artificial hip joint: Secondary | ICD-10-CM | POA: Insufficient documentation

## 2015-05-19 DIAGNOSIS — Z87891 Personal history of nicotine dependence: Secondary | ICD-10-CM | POA: Diagnosis not present

## 2015-05-19 DIAGNOSIS — I10 Essential (primary) hypertension: Secondary | ICD-10-CM | POA: Insufficient documentation

## 2015-05-19 DIAGNOSIS — R3914 Feeling of incomplete bladder emptying: Secondary | ICD-10-CM | POA: Insufficient documentation

## 2015-05-19 DIAGNOSIS — N138 Other obstructive and reflux uropathy: Secondary | ICD-10-CM | POA: Diagnosis not present

## 2015-05-19 DIAGNOSIS — N401 Enlarged prostate with lower urinary tract symptoms: Secondary | ICD-10-CM | POA: Diagnosis not present

## 2015-05-19 DIAGNOSIS — E039 Hypothyroidism, unspecified: Secondary | ICD-10-CM | POA: Diagnosis not present

## 2015-05-19 DIAGNOSIS — E785 Hyperlipidemia, unspecified: Secondary | ICD-10-CM | POA: Insufficient documentation

## 2015-05-19 DIAGNOSIS — R3916 Straining to void: Secondary | ICD-10-CM | POA: Diagnosis not present

## 2015-05-19 DIAGNOSIS — R3911 Hesitancy of micturition: Secondary | ICD-10-CM | POA: Diagnosis not present

## 2015-05-19 DIAGNOSIS — R351 Nocturia: Secondary | ICD-10-CM | POA: Diagnosis not present

## 2015-05-19 DIAGNOSIS — N400 Enlarged prostate without lower urinary tract symptoms: Secondary | ICD-10-CM | POA: Diagnosis not present

## 2015-05-19 DIAGNOSIS — N320 Bladder-neck obstruction: Secondary | ICD-10-CM | POA: Diagnosis not present

## 2015-05-19 HISTORY — DX: Other complications of anesthesia, initial encounter: T88.59XA

## 2015-05-19 HISTORY — DX: Localized edema: R60.0

## 2015-05-19 HISTORY — PX: TRANSURETHRAL RESECTION OF PROSTATE: SHX73

## 2015-05-19 HISTORY — DX: Bladder-neck obstruction: N32.0

## 2015-05-19 HISTORY — DX: Presence of dental prosthetic device (complete) (partial): Z97.2

## 2015-05-19 HISTORY — DX: Personal history of other specified conditions: Z87.898

## 2015-05-19 HISTORY — DX: Nocturia: R35.1

## 2015-05-19 HISTORY — DX: Hypothyroidism, unspecified: E03.9

## 2015-05-19 HISTORY — DX: Adverse effect of unspecified anesthetic, initial encounter: T41.45XA

## 2015-05-19 HISTORY — DX: Diverticulosis of large intestine without perforation or abscess without bleeding: K57.30

## 2015-05-19 HISTORY — DX: Dermatitis, unspecified: L30.9

## 2015-05-19 HISTORY — DX: Presence of spectacles and contact lenses: Z97.3

## 2015-05-19 HISTORY — DX: Presence of external hearing-aid: Z97.4

## 2015-05-19 LAB — POCT I-STAT, CHEM 8
BUN: 22 mg/dL — ABNORMAL HIGH (ref 6–20)
Calcium, Ion: 1.19 mmol/L (ref 1.13–1.30)
Chloride: 104 mmol/L (ref 101–111)
Creatinine, Ser: 0.9 mg/dL (ref 0.61–1.24)
GLUCOSE: 96 mg/dL (ref 65–99)
HEMATOCRIT: 49 % (ref 39.0–52.0)
HEMOGLOBIN: 16.7 g/dL (ref 13.0–17.0)
POTASSIUM: 3.9 mmol/L (ref 3.5–5.1)
Sodium: 140 mmol/L (ref 135–145)
TCO2: 22 mmol/L (ref 0–100)

## 2015-05-19 SURGERY — TRANSURETHRAL RESECTION OF THE PROSTATE WITH GYRUS INSTRUMENTS
Anesthesia: General

## 2015-05-19 MED ORDER — CEFAZOLIN SODIUM 1-5 GM-% IV SOLN
1.0000 g | INTRAVENOUS | Status: DC
  Filled 2015-05-19: qty 50

## 2015-05-19 MED ORDER — HYDROCODONE-ACETAMINOPHEN 7.5-325 MG PO TABS
1.0000 | ORAL_TABLET | Freq: Once | ORAL | Status: DC | PRN
  Filled 2015-05-19: qty 1

## 2015-05-19 MED ORDER — DEXAMETHASONE SODIUM PHOSPHATE 4 MG/ML IJ SOLN
INTRAMUSCULAR | Status: DC | PRN
  Administered 2015-05-19: 5 mg via INTRAVENOUS

## 2015-05-19 MED ORDER — EPHEDRINE SULFATE 50 MG/ML IJ SOLN
INTRAMUSCULAR | Status: DC | PRN
  Administered 2015-05-19: 10 mg via INTRAVENOUS

## 2015-05-19 MED ORDER — SODIUM CHLORIDE 0.9 % IV SOLN: 250.0000 mL | INTRAVENOUS | Status: DC | PRN

## 2015-05-19 MED ORDER — HYDROCODONE-ACETAMINOPHEN 5-325 MG PO TABS: 1.0000 | ORAL_TABLET | ORAL | Status: DC | PRN

## 2015-05-19 MED ORDER — FENTANYL CITRATE (PF) 100 MCG/2ML IJ SOLN
INTRAMUSCULAR | Status: AC
  Filled 2015-05-19: qty 4

## 2015-05-19 MED ORDER — BELLADONNA ALKALOIDS-OPIUM 16.2-60 MG RE SUPP: 1.0000 | Freq: Four times a day (QID) | RECTAL | Status: DC | PRN

## 2015-05-19 MED ORDER — LACTATED RINGERS IV SOLN
INTRAVENOUS | Status: DC
  Administered 2015-05-19 (×3): via INTRAVENOUS
  Filled 2015-05-19: qty 1000

## 2015-05-19 MED ORDER — HYDROCHLOROTHIAZIDE 25 MG PO TABS
12.5000 mg | ORAL_TABLET | Freq: Every morning | ORAL | Status: DC
  Administered 2015-05-19 – 2015-05-20 (×2): 12.5 mg via ORAL
  Filled 2015-05-19 (×2): qty 1

## 2015-05-19 MED ORDER — PRAVASTATIN SODIUM 40 MG PO TABS
40.0000 mg | ORAL_TABLET | Freq: Every evening | ORAL | Status: DC
  Administered 2015-05-19: 40 mg via ORAL
  Filled 2015-05-19: qty 1

## 2015-05-19 MED ORDER — LISINOPRIL 20 MG PO TABS
20.0000 mg | ORAL_TABLET | Freq: Every morning | ORAL | Status: DC
  Administered 2015-05-19 – 2015-05-20 (×2): 20 mg via ORAL
  Filled 2015-05-19 (×2): qty 1

## 2015-05-19 MED ORDER — LEVOTHYROXINE SODIUM 25 MCG PO TABS
50.0000 ug | ORAL_TABLET | Freq: Every day | ORAL | Status: DC
  Administered 2015-05-20: 50 ug via ORAL
  Filled 2015-05-19: qty 2

## 2015-05-19 MED ORDER — SODIUM CHLORIDE 0.9 % IJ SOLN: 3.0000 mL | Freq: Two times a day (BID) | INTRAMUSCULAR | Status: DC

## 2015-05-19 MED ORDER — ONDANSETRON HCL 4 MG/2ML IJ SOLN: 4.0000 mg | INTRAMUSCULAR | Status: DC | PRN

## 2015-05-19 MED ORDER — SULFAMETHOXAZOLE-TRIMETHOPRIM 800-160 MG PO TABS
1.0000 | ORAL_TABLET | Freq: Two times a day (BID) | ORAL | Status: DC
  Administered 2015-05-19 – 2015-05-20 (×3): 1 via ORAL
  Filled 2015-05-19 (×3): qty 1

## 2015-05-19 MED ORDER — DOCUSATE SODIUM 100 MG PO CAPS
100.0000 mg | ORAL_CAPSULE | Freq: Two times a day (BID) | ORAL | Status: DC
  Administered 2015-05-19 – 2015-05-20 (×3): 100 mg via ORAL
  Filled 2015-05-19 (×3): qty 1

## 2015-05-19 MED ORDER — MEPERIDINE HCL 25 MG/ML IJ SOLN
6.2500 mg | INTRAMUSCULAR | Status: DC | PRN
  Filled 2015-05-19: qty 1

## 2015-05-19 MED ORDER — CEFAZOLIN SODIUM-DEXTROSE 2-3 GM-% IV SOLR
2.0000 g | INTRAVENOUS | Status: AC
  Administered 2015-05-19: 2 g via INTRAVENOUS
  Filled 2015-05-19: qty 50

## 2015-05-19 MED ORDER — HYDROMORPHONE HCL 1 MG/ML IJ SOLN
0.2500 mg | INTRAMUSCULAR | Status: DC | PRN
  Filled 2015-05-19: qty 1

## 2015-05-19 MED ORDER — SODIUM CHLORIDE 0.9 % IJ SOLN: 3.0000 mL | INTRAMUSCULAR | Status: DC | PRN

## 2015-05-19 MED ORDER — PROPOFOL 10 MG/ML IV BOLUS
INTRAVENOUS | Status: DC | PRN
  Administered 2015-05-19: 120 mg via INTRAVENOUS

## 2015-05-19 MED ORDER — CEFAZOLIN SODIUM-DEXTROSE 2-3 GM-% IV SOLR
INTRAVENOUS | Status: AC
  Filled 2015-05-19: qty 50

## 2015-05-19 MED ORDER — ONDANSETRON HCL 4 MG/2ML IJ SOLN
INTRAMUSCULAR | Status: DC | PRN
  Administered 2015-05-19: 4 mg via INTRAVENOUS

## 2015-05-19 MED ORDER — LIDOCAINE HCL (CARDIAC) 20 MG/ML IV SOLN
INTRAVENOUS | Status: DC | PRN
  Administered 2015-05-19: 60 mg via INTRAVENOUS

## 2015-05-19 MED ORDER — SODIUM CHLORIDE 0.45 % IV SOLN
INTRAVENOUS | Status: DC
  Administered 2015-05-19 – 2015-05-20 (×2): via INTRAVENOUS

## 2015-05-19 MED ORDER — FENTANYL CITRATE (PF) 100 MCG/2ML IJ SOLN
INTRAMUSCULAR | Status: DC | PRN
  Administered 2015-05-19: 50 ug via INTRAVENOUS

## 2015-05-19 MED ORDER — ACETAMINOPHEN 325 MG PO TABS: 650.0000 mg | ORAL_TABLET | ORAL | Status: DC | PRN

## 2015-05-19 MED ORDER — PROMETHAZINE HCL 25 MG/ML IJ SOLN
6.2500 mg | INTRAMUSCULAR | Status: DC | PRN
  Filled 2015-05-19: qty 1

## 2015-05-19 SURGICAL SUPPLY — 35 items
BAG DRAIN URO-CYSTO SKYTR STRL (DRAIN) ×3 IMPLANT
BAG URINE DRAINAGE (UROLOGICAL SUPPLIES) ×3 IMPLANT
BAG URINE LEG 19OZ MD ST LTX (BAG) IMPLANT
CATH FOLEY 2WAY SLVR  5CC 20FR (CATHETERS) ×2
CATH FOLEY 2WAY SLVR  5CC 22FR (CATHETERS)
CATH FOLEY 2WAY SLVR 30CC 22FR (CATHETERS) IMPLANT
CATH FOLEY 2WAY SLVR 5CC 20FR (CATHETERS) ×1 IMPLANT
CATH FOLEY 2WAY SLVR 5CC 22FR (CATHETERS) IMPLANT
CATH FOLEY 3WAY 30CC 22F (CATHETERS) IMPLANT
CATH HEMA 3WAY 30CC 24FR COUDE (CATHETERS) IMPLANT
CATH HEMA 3WAY 30CC 24FR RND (CATHETERS) IMPLANT
CLOTH BEACON ORANGE TIMEOUT ST (SAFETY) ×3 IMPLANT
ELECT BIVAP BIPO 22/24 DONUT (ELECTROSURGICAL) ×3
ELECT REM PT RETURN 9FT ADLT (ELECTROSURGICAL) ×3
ELECTRD BIVAP BIPO 22/24 DONUT (ELECTROSURGICAL) ×1 IMPLANT
ELECTRODE REM PT RTRN 9FT ADLT (ELECTROSURGICAL) ×1 IMPLANT
EVACUATOR MICROVAS BLADDER (UROLOGICAL SUPPLIES) IMPLANT
GLOVE BIO SURGEON STRL SZ 6.5 (GLOVE) ×2 IMPLANT
GLOVE BIO SURGEON STRL SZ8 (GLOVE) IMPLANT
GLOVE BIO SURGEONS STRL SZ 6.5 (GLOVE) ×1
GLOVE BIOGEL PI IND STRL 7.5 (GLOVE) ×1 IMPLANT
GLOVE BIOGEL PI INDICATOR 7.5 (GLOVE) ×2
GLOVE INDICATOR 7.5 STRL GRN (GLOVE) ×3 IMPLANT
GOWN STRL REUS W/ TWL LRG LVL3 (GOWN DISPOSABLE) IMPLANT
GOWN STRL REUS W/ TWL XL LVL3 (GOWN DISPOSABLE) ×2 IMPLANT
GOWN STRL REUS W/TWL LRG LVL3 (GOWN DISPOSABLE)
GOWN STRL REUS W/TWL XL LVL3 (GOWN DISPOSABLE) ×4
HOLDER FOLEY CATH W/STRAP (MISCELLANEOUS) IMPLANT
IV NS IRRIG 3000ML ARTHROMATIC (IV SOLUTION) ×9 IMPLANT
MANIFOLD NEPTUNE II (INSTRUMENTS) ×3 IMPLANT
PACK CYSTO (CUSTOM PROCEDURE TRAY) ×3 IMPLANT
PLUG CATH AND CAP STER (CATHETERS) IMPLANT
SET ASPIRATION TUBING (TUBING) ×3 IMPLANT
SYR 30ML LL (SYRINGE) IMPLANT
SYRINGE IRR TOOMEY STRL 70CC (SYRINGE) IMPLANT

## 2015-05-19 NOTE — Discharge Instructions (Signed)
Transurethral Resection of the Prostate ° °Care After ° °Refer to this sheet in the next few weeks. These discharge instructions provide you with general information on caring for yourself after you leave the hospital. Your caregiver may also give you specific instructions. Your treatment has been planned according to the most current medical practices available, but unavoidable complications sometimes occur. If you have any problems or questions after discharge, please call your caregiver. ° °HOME CARE INSTRUCTIONS  ° °Medications °· You may receive medicine for pain management. As your level of discomfort decreases, adjustments in your pain medicines may be made.  °· Take all medicines as directed.  °· You may be given a medicine (antibiotic) to kill germs following surgery. Finish all medicines. Let your caregiver know if you have any side effects or problems from the medicine.  °· If you are on aspirin, it would be best not to restart the aspirin until the blood in the urine clears °Hygiene °· You can take a shower after surgery.  °· You should not take a bath while you still have the urethral catheter. °Activity °· You will be encouraged to get out of bed as much as possible and increase your activity level as tolerated.  °· Spend the first week in and around your home. For 3 weeks, avoid the following:  °· Straining.  °· Running.  °· Strenuous work.  °· Walks longer than a few blocks.  °· Riding for extended periods.  °· Sexual relations.  °· Do not lift heavy objects (more than 20 pounds) for at least 1 month. When lifting, use your arms instead of your abdominal muscles.  °· You will be encouraged to walk as tolerated. Do not exert yourself. Increase your activity level slowly. Remember that it is important to keep moving after an operation of any type. This cuts down on the possibility of developing blood clots.  °· Your caregiver will tell you when you can resume driving and light housework. Discuss this  at your first office visit after discharge. °Diet °· No special diet is ordered after a TURP. However, if you are on a special diet for another medical problem, it should be continued.  °· Normal fluid intake is usually recommended.  °· Avoid alcohol and caffeinated drinks for 2 weeks. They irritate the bladder. Decaffeinated drinks are okay.  °· Avoid spicy foods.  °Bladder Function °· For the first 10 days, empty the bladder whenever you feel a definite desire. Do not try to hold the urine for long periods of time.  °· Urinating once or twice a night even after you are healed is not uncommon.  °· You may see some recurrence of blood in the urine after discharge from the hospital. This usually happens within 2 weeks after the procedure.If this occurs, force fluids again as you did in the hospital and reduce your activity.  °Bowel Function °· You may experience some constipation after surgery. This can be minimized by increasing fluids and fiber in your diet. Drink enough water and fluids to keep your urine clear or pale yellow.  °· A stool softener may be prescribed for use at home. Do not strain to move your bowels.  °· If you are requiring increased pain medicine, it is important that you take stool softeners to prevent constipation. This will help to promote proper healing by reducing the need to strain to move your bowels.  °Sexual Activity °· Semen movement in the opposite direction and into the bladder (  retrograde ejaculation) may occur. Since the semen passes into the bladder, cloudy urine can occur the first time you urinate after intercourse. Or, you may not have an ejaculation during erection. Ask your caregiver when you can resume sexual activity. Retrograde ejaculation and reduced semen discharge should not reduce one's pleasure of intercourse.  °Postoperative Visit °· Arrange the date and time of your after surgery visit with your caregiver.  °Return to Work °· After your recovery is complete, you will  be able to return to work and resume all activities. Your caregiver will inform you when you can return to work.  °Foley Catheter Care °A soft, flexible tube (Foley catheter) may have been placed in your bladder to drain urine and fluid. Follow these instructions: °Taking Care of the Catheter °· Keep the area where the catheter leaves your body clean.  °· Attach the catheter to the leg so there is no tension on the catheter.  °· Keep the drainage bag below the level of the bladder, but keep it OFF the floor.  °· Do not take long soaking baths. Your caregiver will give instructions about showering.  °· Wash your hands before touching ANYTHING related to the catheter or bag.  °· Using mild soap and warm water on a washcloth:  °· Clean the area closest to the catheter insertion site using a circular motion around the catheter.  °· Clean the catheter itself by wiping AWAY from the insertion site for several inches down the tube.  °· NEVER wipe upward as this could sweep bacteria up into the urethra (tube in your body that normally drains the bladder) and cause infection.  °· Place a small amount of sterile lubricant at the tip of the penis where the catheter is entering.  °Taking Care of the Drainage Bags °· Two drainage bags may be taken home: a large overnight drainage bag, and a smaller leg bag which fits underneath clothing.  °· It is okay to wear the overnight bag at any time, but NEVER wear the smaller leg bag at night.  °· Keep the drainage bag well below the level of your bladder. This prevents backflow of urine into the bladder and allows the urine to drain freely.  °· Anchor the tubing to your leg to prevent pulling or tension on the catheter. Use tape or a leg strap provided by the hospital.  °· Empty the drainage bag when it is 1/2 to 3/4 full. Wash your hands before and after touching the bag.  °· Periodically check the tubing for kinks to make sure there is no pressure on the tubing which could restrict  the flow of urine.  °Changing the Drainage Bags °· Cleanse both ends of the clean bag with alcohol before changing.  °· Pinch off the rubber catheter to avoid urine spillage during the disconnection.  °· Disconnect the dirty bag and connect the clean one.  °· Empty the dirty bag carefully to avoid a urine spill.  °· Attach the new bag to the leg with tape or a leg strap.  °Cleaning the Drainage Bags °· Whenever a drainage bag is disconnected, it must be cleaned quickly so it is ready for the next use.  °· Wash the bag in warm, soapy water.  °· Rinse the bag thoroughly with warm water.  °· Soak the bag for 30 minutes in a solution of white vinegar and water (1 cup vinegar to 1 quart warm water).  °· Rinse with warm water.  °SEEK MEDICAL   CARE IF:  °· You have chills or night sweats.  °· You are leaking around your catheter or have problems with your catheter. It is not uncommon to have sporadic leakage around your catheter as a result of bladder spasms. If the leakage stops, there is not much need for concern. If you are uncertain, call your caregiver.  °· You develop side effects that you think are coming from your medicines.  °SEEK IMMEDIATE MEDICAL CARE IF:  °· You are suddenly unable to urinate. Check to see if there are any kinks in the drainage tubing that may cause this. If you cannot find any kinks, call your caregiver immediately. This is an emergency.  °· You develop shortness of breath or chest pains.  °· Bleeding persists or clots develop in your urine.  °· You have a fever.  °· You develop pain in your back or over your lower belly (abdomen).  °· You develop pain or swelling in your legs.  °· Any problems you are having get worse rather than better.  °MAKE SURE YOU:  °· Understand these instructions.  °· Will watch your condition.  °· Will get help right away if you are not doing well or get worse.  °Document Released: 08/27/2005 Document Revised: 05/09/2011 Document Reviewed: 04/20/2009 °[employer]®  Patient Information ©2012 [employer].Transurethral Resection of the Prostate °Care After °Refer to this sheet in the next few weeks. These discharge instructions provide you with general information on caring for yourself after you leave the hospital. Your caregiver may also give you specific instructions. Your treatment has been planned according to the most current medical practices available, but unavoidable complications sometimes occur. If you have any problems or questions after discharge, please call your caregiver. °

## 2015-05-19 NOTE — Anesthesia Postprocedure Evaluation (Signed)
Anesthesia Post Note  Patient: Erik Jackson  Procedure(s) Performed: Procedure(s) (LRB): TRANSURETHRAL RESECTION OF THE PROSTATE WITH GYRUS INSTRUMENTS AND POSSIBLY BUTTON (N/A)  Anesthesia type: General  Patient location: PACU  Post pain: Pain level controlled  Post assessment: Post-op Vital signs reviewed  Last Vitals: BP 138/67 mmHg  Pulse 66  Temp(Src) 36.3 C (Oral)  Resp 10  Ht  (1.753 m)  Wt 170 lb (77.111 kg)  BMI 25.09 kg/m2  SpO2 100%  Post vital signs: Reviewed  Level of consciousness: sedated  Complications: No apparent anesthesia complications

## 2015-05-19 NOTE — Anesthesia Preprocedure Evaluation (Addendum)
Anesthesia Evaluation  Patient identified by MRN, date of birth, ID band Patient awake    Reviewed: Allergy & Precautions, NPO status , Patient's Chart, lab work & pertinent test results  History of Anesthesia Complications (+) history of anesthetic complications  Airway Mallampati: II  TM Distance: >3 FB Neck ROM: Full    Dental no notable dental hx.    Pulmonary neg pulmonary ROS, former smoker,    Pulmonary exam normal breath sounds clear to auscultation       Cardiovascular hypertension, Pt. on medications Normal cardiovascular exam Rhythm:Regular Rate:Normal     Neuro/Psych negative neurological ROS  negative psych ROS   GI/Hepatic negative GI ROS, Neg liver ROS,   Endo/Other  Hypothyroidism   Renal/GU negative Renal ROS  negative genitourinary   Musculoskeletal negative musculoskeletal ROS (+)   Abdominal   Peds negative pediatric ROS (+)  Hematology negative hematology ROS (+)   Anesthesia Other Findings   Reproductive/Obstetrics negative OB ROS                             Anesthesia Physical Anesthesia Plan  ASA: II  Anesthesia Plan: General   Post-op Pain Management:    Induction: Intravenous  Airway Management Planned: LMA  Additional Equipment:   Intra-op Plan:   Post-operative Plan: Extubation in OR  Informed Consent: I have reviewed the patients History and Physical, chart, labs and discussed the procedure including the risks, benefits and alternatives for the proposed anesthesia with the patient or authorized representative who has indicated his/her understanding and acceptance.   Dental advisory given  Plan Discussed with: CRNA  Anesthesia Plan Comments:         Anesthesia Quick Evaluation

## 2015-05-19 NOTE — Anesthesia Procedure Notes (Signed)
Procedure Name: LMA Insertion Date/Time: 05/19/2015 9:13 AM Performed by: Renella Cunas D Pre-anesthesia Checklist: Patient identified, Emergency Drugs available, Suction available and Patient being monitored Patient Re-evaluated:Patient Re-evaluated prior to inductionOxygen Delivery Method: Circle System Utilized Preoxygenation: Pre-oxygenation with 100% oxygen Intubation Type: IV induction Ventilation: Mask ventilation without difficulty LMA: LMA inserted LMA Size: 4.0 Number of attempts: 1 Airway Equipment and Method: Bite block Placement Confirmation: positive ETCO2 Tube secured with: Tape Dental Injury: Teeth and Oropharynx as per pre-operative assessment

## 2015-05-19 NOTE — Transfer of Care (Signed)
Last Vitals:  Filed Vitals:   05/19/15 0812  BP: 142/59  Pulse: 52  Temp: 36.8 C  Resp: 18    Immediate Anesthesia Transfer of Care Note  Patient: Erik Jackson  Procedure(s) Performed: Procedure(s) (LRB): TRANSURETHRAL RESECTION OF THE PROSTATE WITH GYRUS INSTRUMENTS AND POSSIBLY BUTTON (N/A)  Patient Location: PACU  Anesthesia Type: General  Level of Consciousness: awake, alert  and oriented  Airway & Oxygen Therapy: Patient Spontanous Breathing and Patient connected to face mask oxygen  Post-op Assessment: Report given to PACU RN and Post -op Vital signs reviewed and stable  Post vital signs: Reviewed and stable  Complications: No apparent anesthesia complications

## 2015-05-20 ENCOUNTER — Encounter (HOSPITAL_BASED_OUTPATIENT_CLINIC_OR_DEPARTMENT_OTHER): Payer: Self-pay | Admitting: Urology

## 2015-05-20 ENCOUNTER — Telehealth: Payer: Self-pay | Admitting: *Deleted

## 2015-05-20 DIAGNOSIS — E039 Hypothyroidism, unspecified: Secondary | ICD-10-CM | POA: Diagnosis not present

## 2015-05-20 DIAGNOSIS — Z87891 Personal history of nicotine dependence: Secondary | ICD-10-CM | POA: Diagnosis not present

## 2015-05-20 DIAGNOSIS — E785 Hyperlipidemia, unspecified: Secondary | ICD-10-CM | POA: Diagnosis not present

## 2015-05-20 DIAGNOSIS — R351 Nocturia: Secondary | ICD-10-CM | POA: Diagnosis not present

## 2015-05-20 DIAGNOSIS — R3916 Straining to void: Secondary | ICD-10-CM | POA: Diagnosis not present

## 2015-05-20 DIAGNOSIS — N401 Enlarged prostate with lower urinary tract symptoms: Secondary | ICD-10-CM | POA: Diagnosis not present

## 2015-05-20 DIAGNOSIS — R3914 Feeling of incomplete bladder emptying: Secondary | ICD-10-CM | POA: Diagnosis not present

## 2015-05-20 DIAGNOSIS — H9193 Unspecified hearing loss, bilateral: Secondary | ICD-10-CM | POA: Diagnosis not present

## 2015-05-20 DIAGNOSIS — R3911 Hesitancy of micturition: Secondary | ICD-10-CM | POA: Diagnosis not present

## 2015-05-20 DIAGNOSIS — N138 Other obstructive and reflux uropathy: Secondary | ICD-10-CM | POA: Diagnosis not present

## 2015-05-20 DIAGNOSIS — I10 Essential (primary) hypertension: Secondary | ICD-10-CM | POA: Diagnosis not present

## 2015-05-20 DIAGNOSIS — Z96641 Presence of right artificial hip joint: Secondary | ICD-10-CM | POA: Diagnosis not present

## 2015-05-20 DIAGNOSIS — N39498 Other specified urinary incontinence: Secondary | ICD-10-CM | POA: Diagnosis not present

## 2015-05-20 DIAGNOSIS — R3912 Poor urinary stream: Secondary | ICD-10-CM | POA: Diagnosis not present

## 2015-05-20 MED ORDER — SULFAMETHOXAZOLE-TRIMETHOPRIM 800-160 MG PO TABS: 1.0000 | ORAL_TABLET | Freq: Two times a day (BID) | ORAL | Status: DC

## 2015-05-20 NOTE — Discharge Summary (Signed)
Patient ID: SINAN TUCH MRN: [id number] DOB/AGE: August 02, 1931 81 y.o.  Admit date: 05/19/2015 Discharge date: 05/20/2015  Primary Care Physician:  Oliver Barre, MD  Discharge Diagnoses:   Present on Admission:  . Enlarged prostate with urinary obstruction  Consults: None   Discharge Medications:   Medication List    STOP taking these medications        aspirin 81 MG tablet      TAKE these medications        CENTRUM SILVER ADULT 50+ PO  Take 1 tablet by mouth daily.     Fish Oil 1000 MG Caps  Take 2 capsules by mouth every morning.     hydrochlorothiazide 12.5 MG tablet  Commonly known as:  HYDRODIURIL  Take 1 tablet (12.5 mg total) by mouth daily.     levothyroxine 50 MCG tablet  Commonly known as:  SYNTHROID, LEVOTHROID  TAKE ONE TABLET BY MOUTH ONCE DAILY     lisinopril 20 MG tablet  Commonly known as:  PRINIVIL,ZESTRIL  TAKE ONE TABLET BY MOUTH ONCE DAILY     pravastatin 40 MG tablet  Commonly known as:  PRAVACHOL  Take 1 tablet (40 mg total) by mouth every evening.     sulfamethoxazole-trimethoprim 800-160 MG per tablet  Commonly known as:  BACTRIM DS,SEPTRA DS  Take 1 tablet by mouth every 12 (twelve) hours.         Significant Diagnostic Studies:  No results found.  Brief H and P: For complete details please refer to admission H and P, but in brief the patient has recurrent BPH following TURP years ago. He presents for repeat TURP.  Hospital Course:  Active Problems:   Enlarged prostate with urinary obstruction  The patient was admitted directly to the operating room where TURP was performed. Patient tolerated procedure well and was sent to the postoperative floor. He was discharged on postoperative day #1 after his catheter was removed and he voided adequately.  Day of Discharge BP 110/50 mmHg  Pulse 57  Temp(Src) 97.8 F (36.6 C) (Oral)  Resp 20  Ht  (1.753 m)  Wt 77.111 kg (170 lb)  BMI 25.09 kg/m2  SpO2 97%  Results for orders  placed or performed during the hospital encounter of 05/19/15 (from the past 24 hour(s))  I-STAT, chem 8     Status: Abnormal   Collection Time: 05/19/15  8:51 AM  Result Value Ref Range   Sodium 140 135 - 145 mmol/L   Potassium 3.9 3.5 - 5.1 mmol/L   Chloride 104 101 - 111 mmol/L   BUN 22 (H) 6 - 20 mg/dL   Creatinine, Ser 7.82 0.61 - 1.24 mg/dL   Glucose, Bld 96 65 - 99 mg/dL   Calcium, Ion 9.56 2.13 - 1.30 mmol/L   TCO2 22 0 - 100 mmol/L   Hemoglobin 16.7 13.0 - 17.0 g/dL   HCT 08.6 57.8 - 46.9 %    Physical Exam: General: Alert and awake oriented x3 not in any acute distress. HEENT: anicteric sclera, pupils reactive to light and accommodation CVS: S1-S2 clear no murmur rubs or gallops Chest: clear to auscultation bilaterally, no wheezing rales or rhonchi Abdomen: soft nontender, nondistended, normal bowel sounds, no organomegaly Extremities: no cyanosis, clubbing or edema noted bilaterally Neuro: Cranial nerves II-XII intact, no focal neurological deficits  Disposition:  Home  Diet:  No restrictions  Activity:  Gradually increase, discussed with patient   Disposition and Follow-up:    We will follow-up in approximately  one month  TESTS THAT NEED FOLLOW-UP  None  DISCHARGE FOLLOW-UP     Follow-up Information    Follow up with Chelsea Aus, MD.   Specialty:  Urology   Why:  We will call to set up appointment   Contact information:   [address] [place] [address] [phone]       Follow up with Chelsea Aus, MD.   Specialty:  Urology   Why:  we will call you   Contact information:   [address] [place] [address] [phone]       Time spent on Discharge:  10 minutes  Signed: Chelsea Aus 05/20/2015, 6:41 AM

## 2015-05-20 NOTE — Progress Notes (Signed)
Patient discharge paperwork given. No questions from patient or wife.

## 2015-05-20 NOTE — Discharge Summary (Deleted)
Preoperative diagnosis: 1. Bladder outlet obstruction secondary to BPH  Postoperative diagnosis:  1. Bladder outlet obstruction secondary to BPH  Procedure:  1. Cystoscopy 2. Transurethral vaporization of the prostate with gyrus button  Surgeon: Unity Luepke M. Ritter Helsley, M.D.  Anesthesia: General  Complications: None  Drain: Foley catheter, 20 French  EBL: Minimal  Specimens: 1. None   Indication: Erik Jackson is a patient with bladder outlet obstruction secondary to benign prostatic hyperplasia. After reviewing the management options for treatment, he elected to proceed with the above surgical procedure(s). We have discussed the potential benefits and risks of the procedure, side effects of the proposed treatment, the likelihood of the patient achieving the goals of the procedure, and any potential problems that might occur during the procedure or recuperation. Informed consent has been obtained.  Description of procedure:  The patient was identified in the holding area.He received preoperative antibiotics. He was then taken to the operating room. General anesthetic was administered.  The patient was then placed in the dorsal lithotomy position, prepped and draped in the usual sterile fashion. Timeout was then performed.  A resectoscope sheath was placed using the obturator, and the resectoscope, gyrus button telescope were placed.  The bladder was then systematically examined in its entirety. There was no evidence of  tumors, stones, or other mucosal pathology. there was an overriding right prostatic lobe. The remainder of the prostatic fossa was relatively nonobstructive.   The ureteral orifices were identified and marked so as to be avoided during the procedure.  The prostate adenoma was then vaporized utilizing the bipolar button.  The prostate adenoma from the bladder neck back to the verumontanum was vaporized beginning at the six o'clock position and then extended to  include the right and left lobes of the prostate and anterior prostate. Care was taken not to vaporize distal to the verumontanum.  Hemostasis was then achieved with the cautery and the bladder was emptied and reinspected with no significant bleeding noted at the end of the procedure.  20 French, two-way then placed into the bladder and placto bag drainagehe patient appeared to tolerate the procedure well and without complications. The patient was able to be awakened and transferred to the recovery unit in satisfactory condition. He tolerated the procedure well. 

## 2015-05-20 NOTE — Telephone Encounter (Signed)
Pt was on tcm list d/c 05/19/15 had a procedure cystoscopy. Pt will f/u with Dr. Lynnae Sandhoff...Raechel Chute

## 2015-05-23 NOTE — Op Note (Signed)
Preoperative diagnosis: 1. Bladder outlet obstruction secondary to BPH  Postoperative diagnosis:  1. Bladder outlet obstruction secondary to BPH  Procedure:  1. Cystoscopy 2. Transurethral vaporization of the prostate with gyrus button  Surgeon: Bertram Millard. Ardith Test, M.D.  Anesthesia: General  Complications: None  Drain: Foley catheter, 20 French  EBL: Minimal  Specimens: 1. None   Indication: Erik Jackson is a patient with bladder outlet obstruction secondary to benign prostatic hyperplasia. After reviewing the management options for treatment, he elected to proceed with the above surgical procedure(s). We have discussed the potential benefits and risks of the procedure, side effects of the proposed treatment, the likelihood of the patient achieving the goals of the procedure, and any potential problems that might occur during the procedure or recuperation. Informed consent has been obtained.  Description of procedure:  The patient was identified in the holding area.He received preoperative antibiotics. He was then taken to the operating room. General anesthetic was administered.  The patient was then placed in the dorsal lithotomy position, prepped and draped in the usual sterile fashion. Timeout was then performed.  A resectoscope sheath was placed using the obturator, and the resectoscope, gyrus button telescope were placed.  The bladder was then systematically examined in its entirety. There was no evidence of  tumors, stones, or other mucosal pathology. there was an overriding right prostatic lobe. The remainder of the prostatic fossa was relatively nonobstructive.   The ureteral orifices were identified and marked so as to be avoided during the procedure.  The prostate adenoma was then vaporized utilizing the bipolar button.  The prostate adenoma from the bladder neck back to the verumontanum was vaporized beginning at the six o'clock position and then extended to  include the right and left lobes of the prostate and anterior prostate. Care was taken not to vaporize distal to the verumontanum.  Hemostasis was then achieved with the cautery and the bladder was emptied and reinspected with no significant bleeding noted at the end of the procedure.  20 [place], two-way then placed into the bladder and placto bag drainagehe patient appeared to tolerate the procedure well and without complications. The patient was able to be awakened and transferred to the recovery unit in satisfactory condition. He tolerated the procedure well.

## 2015-06-15 DIAGNOSIS — R300 Dysuria: Secondary | ICD-10-CM | POA: Diagnosis not present

## 2015-06-15 DIAGNOSIS — N390 Urinary tract infection, site not specified: Secondary | ICD-10-CM | POA: Diagnosis not present

## 2015-09-08 ENCOUNTER — Encounter (HOSPITAL_COMMUNITY): Payer: Self-pay

## 2015-09-08 ENCOUNTER — Ambulatory Visit: Payer: Self-pay | Admitting: Primary Care

## 2015-09-08 ENCOUNTER — Emergency Department (HOSPITAL_COMMUNITY)
Admission: EM | Admit: 2015-09-08 | Discharge: 2015-09-08 | Disposition: A | Discharge status: Home / Self Care | Payer: Medicare Other | Attending: Emergency Medicine | Admitting: Emergency Medicine

## 2015-09-08 ENCOUNTER — Emergency Department (HOSPITAL_COMMUNITY): Payer: Medicare Other

## 2015-09-08 DIAGNOSIS — I10 Essential (primary) hypertension: Secondary | ICD-10-CM | POA: Diagnosis not present

## 2015-09-08 DIAGNOSIS — Z87891 Personal history of nicotine dependence: Secondary | ICD-10-CM | POA: Insufficient documentation

## 2015-09-08 DIAGNOSIS — Z974 Presence of external hearing-aid: Secondary | ICD-10-CM | POA: Insufficient documentation

## 2015-09-08 DIAGNOSIS — Z87448 Personal history of other diseases of urinary system: Secondary | ICD-10-CM | POA: Diagnosis not present

## 2015-09-08 DIAGNOSIS — E785 Hyperlipidemia, unspecified: Secondary | ICD-10-CM | POA: Insufficient documentation

## 2015-09-08 DIAGNOSIS — E039 Hypothyroidism, unspecified: Secondary | ICD-10-CM | POA: Insufficient documentation

## 2015-09-08 DIAGNOSIS — Z8719 Personal history of other diseases of the digestive system: Secondary | ICD-10-CM | POA: Diagnosis not present

## 2015-09-08 DIAGNOSIS — Z872 Personal history of diseases of the skin and subcutaneous tissue: Secondary | ICD-10-CM | POA: Diagnosis not present

## 2015-09-08 DIAGNOSIS — R42 Dizziness and giddiness: Secondary | ICD-10-CM

## 2015-09-08 DIAGNOSIS — Z87828 Personal history of other (healed) physical injury and trauma: Secondary | ICD-10-CM | POA: Diagnosis not present

## 2015-09-08 DIAGNOSIS — Z79899 Other long term (current) drug therapy: Secondary | ICD-10-CM | POA: Insufficient documentation

## 2015-09-08 LAB — URINALYSIS, ROUTINE W REFLEX MICROSCOPIC
BILIRUBIN URINE: NEGATIVE
GLUCOSE, UA: NEGATIVE mg/dL
HGB URINE DIPSTICK: NEGATIVE
Ketones, ur: NEGATIVE mg/dL
Leukocytes, UA: NEGATIVE
Nitrite: NEGATIVE
PH: 5.5 (ref 5.0–8.0)
Protein, ur: NEGATIVE mg/dL
SPECIFIC GRAVITY, URINE: 1.02 (ref 1.005–1.030)

## 2015-09-08 MED ORDER — MECLIZINE HCL 25 MG PO TABS: 25.0000 mg | ORAL_TABLET | Freq: Three times a day (TID) | ORAL | Status: DC | PRN

## 2015-09-08 MED ORDER — DIPHENHYDRAMINE HCL 25 MG PO CAPS
25.0000 mg | ORAL_CAPSULE | Freq: Once | ORAL | Status: AC
  Administered 2015-09-08: 25 mg via ORAL
  Filled 2015-09-08: qty 1

## 2015-09-08 MED ORDER — DIPHENHYDRAMINE HCL 25 MG PO CAPS: 25.0000 mg | ORAL_CAPSULE | Freq: Four times a day (QID) | ORAL | Status: DC | PRN

## 2015-09-08 MED ORDER — MECLIZINE HCL 25 MG PO TABS
25.0000 mg | ORAL_TABLET | Freq: Once | ORAL | Status: AC
  Administered 2015-09-08: 25 mg via ORAL
  Filled 2015-09-08: qty 1
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
## 2015-09-08 NOTE — ED Notes (Addendum)
Patient states that feels dizzy when stands up and makes sudden movements.   States his "legs can't keep up with his head".  Last felt "normal" x2 days ago.  Patient was able to drive himself to the hospital today.  Denies pain, denies fever.

## 2015-09-08 NOTE — Discharge Instructions (Signed)
Dizziness °Dizziness is a common problem. It is a feeling of unsteadiness or light-headedness. You may feel like you are about to faint. Dizziness can lead to injury if you stumble or fall. Anyone can become dizzy, but dizziness is more common in older adults. This condition can be caused by a number of things, including medicines, dehydration, or illness. °HOME CARE INSTRUCTIONS °Taking these steps may help with your condition: °Eating and Drinking °· Drink enough fluid to keep your urine clear or pale yellow. This helps to keep you from becoming dehydrated. Try to drink more clear fluids, such as water. °· Do not drink alcohol. °· Limit your caffeine intake if directed by your health care provider. °· Limit your salt intake if directed by your health care provider. °Activity °· Avoid making quick movements. °· Rise slowly from chairs and steady yourself until you feel okay. °· In the morning, first sit up on the side of the bed. When you feel okay, stand slowly while you hold onto something until you know that your balance is fine. °· Move your legs often if you need to stand in one place for a long time. Tighten and relax your muscles in your legs while you are standing. °· Do not drive or operate heavy machinery if you feel dizzy. °· Avoid bending down if you feel dizzy. Place items in your home so that they are easy for you to reach without leaning over. °Lifestyle °· Do not use any tobacco products, including cigarettes, chewing tobacco, or electronic cigarettes. If you need help quitting, ask your health care provider. °· Try to reduce your stress level, such as with yoga or meditation. Talk with your health care provider if you need help. °General Instructions °· Watch your dizziness for any changes. °· Take medicines only as directed by your health care provider. Talk with your health care provider if you think that your dizziness is caused by a medicine that you are taking. °· Tell a friend or a family  member that you are feeling dizzy. If he or she notices any changes in your behavior, have this person call your health care provider. °· Keep all follow-up visits as directed by your health care provider. This is important. °SEEK MEDICAL CARE IF: °· Your dizziness does not go away. °· Your dizziness or light-headedness gets worse. °· You feel nauseous. °· You have reduced hearing. °· You have new symptoms. °· You are unsteady on your feet or you feel like the room is spinning. °SEEK IMMEDIATE MEDICAL CARE IF: °· You vomit or have diarrhea and are unable to eat or drink anything. °· You have problems talking, walking, swallowing, or using your arms, hands, or legs. °· You feel generally weak. °· You are not thinking clearly or you have trouble forming sentences. It may take a friend or family member to notice this. °· You have chest pain, abdominal pain, shortness of breath, or sweating. °· Your vision changes. °· You notice any bleeding. °· You have a headache. °· You have neck pain or a stiff neck. °· You have a fever. °  °This information is not intended to replace advice given to you by your health care provider. Make sure you discuss any questions you have with your health care provider. °  °Document Released: 02/20/2001 Document Revised: 01/11/2015 Document Reviewed: 08/23/2014 °Elsevier Interactive Patient Education ©2016 [employer] ° °Benign Positional Vertigo °Vertigo is the feeling that you or your surroundings are moving when they are not.   Benign positional vertigo is the most common form of vertigo. The cause of this condition is not serious (is benign). This condition is triggered by certain movements and positions (is positional). This condition can be dangerous if it occurs while you are doing something that could endanger you or others, such as driving.  °CAUSES °In many cases, the cause of this condition is not known. It may be caused by a disturbance in an area of the inner ear that helps your  brain to sense movement and balance. This disturbance can be caused by a viral infection (labyrinthitis), head injury, or repetitive motion. °RISK FACTORS °This condition is more likely to develop in: °· Women. °· People who are 50 years of age or older. °SYMPTOMS °Symptoms of this condition usually happen when you move your head or your eyes in different directions. Symptoms may start suddenly, and they usually last for less than a minute. Symptoms may include: °· Loss of balance and falling. °· Feeling like you are spinning or moving. °· Feeling like your surroundings are spinning or moving. °· Nausea and vomiting. °· Blurred vision. °· Dizziness. °· Involuntary eye movement (nystagmus). °Symptoms can be mild and cause only slight annoyance, or they can be severe and interfere with daily life. Episodes of benign positional vertigo may return (recur) over time, and they may be triggered by certain movements. Symptoms may improve over time. °DIAGNOSIS °This condition is usually diagnosed by medical history and a physical exam of the head, neck, and ears. You may be referred to a health care provider who specializes in ear, nose, and throat (ENT) problems (otolaryngologist) or a provider who specializes in disorders of the nervous system (neurologist). You may have additional testing, including: °· MRI. °· A CT scan. °· Eye movement tests. Your health care provider may ask you to change positions quickly while he or she watches you for symptoms of benign positional vertigo, such as nystagmus. Eye movement may be tested with an electronystagmogram (ENG), caloric stimulation, the Dix-Hallpike test, or the roll test. °· An electroencephalogram (EEG). This records electrical activity in your brain. °· Hearing tests. °TREATMENT °Usually, your health care provider will treat this by moving your head in specific positions to adjust your inner ear back to normal. Surgery may be needed in severe cases, but this is rare. In  some cases, benign positional vertigo may resolve on its own in 2-4 weeks. °HOME CARE INSTRUCTIONS °Safety °· Move slowly. Avoid sudden body or head movements. °· Avoid driving. °· Avoid operating heavy machinery. °· Avoid doing any tasks that would be dangerous to you or others if a vertigo episode would occur. °· If you have trouble walking or keeping your balance, try using a cane for stability. If you feel dizzy or unstable, sit down right away. °· Return to your normal activities as told by your health care provider. Ask your health care provider what activities are safe for you. °General Instructions °· Take over-the-counter and prescription medicines only as told by your health care provider. °· Avoid certain positions or movements as told by your health care provider. °· Drink enough fluid to keep your urine clear or pale yellow. °· Keep all follow-up visits as told by your health care provider. This is important. °SEEK MEDICAL CARE IF: °· You have a fever. °· Your condition gets worse or you develop new symptoms. °· Your family or friends notice any behavioral changes. °· Your nausea or vomiting gets worse. °· You have numbness or a "  pins and needles" sensation. °SEEK IMMEDIATE MEDICAL CARE IF: °· You have difficulty speaking or moving. °· You are always dizzy. °· You faint. °· You develop severe headaches. °· You have weakness in your legs or arms. °· You have changes in your hearing or vision. °· You develop a stiff neck. °· You develop sensitivity to light. °  °This information is not intended to replace advice given to you by your health care provider. Make sure you discuss any questions you have with your health care provider. °  °Document Released: 06/04/2006 Document Revised: 05/18/2015 Document Reviewed: 12/20/2014 °Elsevier Interactive Patient Education ©2016 [employer] ° °

## 2015-09-08 NOTE — ED Notes (Signed)
Patient transported to MRI 

## 2015-09-08 NOTE — ED Notes (Signed)
Collected urine. At bedside

## 2015-09-08 NOTE — ED Notes (Signed)
MD at bedside. 

## 2015-09-16 ENCOUNTER — Encounter: Payer: Self-pay | Admitting: Internal Medicine

## 2015-09-16 ENCOUNTER — Other Ambulatory Visit (INDEPENDENT_AMBULATORY_CARE_PROVIDER_SITE_OTHER): Payer: Medicare Other

## 2015-09-16 ENCOUNTER — Ambulatory Visit (INDEPENDENT_AMBULATORY_CARE_PROVIDER_SITE_OTHER)
Admission: RE | Admit: 2015-09-16 | Discharge: 2015-09-16 | Disposition: A | Discharge status: Home / Self Care | Payer: Medicare Other | Source: Ambulatory Visit | Attending: Internal Medicine | Admitting: Internal Medicine

## 2015-09-16 ENCOUNTER — Ambulatory Visit (INDEPENDENT_AMBULATORY_CARE_PROVIDER_SITE_OTHER): Payer: Medicare Other | Admitting: Internal Medicine

## 2015-09-16 VITALS — BP 124/78 | HR 82 | Temp 98.8°F | Ht 70.0 in | Wt 166.0 lb

## 2015-09-16 DIAGNOSIS — W19XXXA Unspecified fall, initial encounter: Secondary | ICD-10-CM

## 2015-09-16 DIAGNOSIS — R0789 Other chest pain: Secondary | ICD-10-CM

## 2015-09-16 DIAGNOSIS — Z0000 Encounter for general adult medical examination without abnormal findings: Secondary | ICD-10-CM | POA: Diagnosis not present

## 2015-09-16 DIAGNOSIS — I10 Essential (primary) hypertension: Secondary | ICD-10-CM

## 2015-09-16 DIAGNOSIS — R42 Dizziness and giddiness: Secondary | ICD-10-CM | POA: Diagnosis not present

## 2015-09-16 DIAGNOSIS — Y92009 Unspecified place in unspecified non-institutional (private) residence as the place of occurrence of the external cause: Secondary | ICD-10-CM

## 2015-09-16 DIAGNOSIS — R0781 Pleurodynia: Secondary | ICD-10-CM

## 2015-09-16 DIAGNOSIS — M109 Gout, unspecified: Secondary | ICD-10-CM

## 2015-09-16 DIAGNOSIS — Y92099 Unspecified place in other non-institutional residence as the place of occurrence of the external cause: Secondary | ICD-10-CM

## 2015-09-16 DIAGNOSIS — S299XXA Unspecified injury of thorax, initial encounter: Secondary | ICD-10-CM | POA: Diagnosis not present

## 2015-09-16 LAB — LIPID PANEL
CHOL/HDL RATIO: 4
Cholesterol: 166 mg/dL (ref 0–200)
HDL: 39.1 mg/dL (ref >39.00)
LDL CALC: 102 mg/dL — AB (ref 0–99)
NONHDL: 126.56
Triglycerides: 124 mg/dL (ref 0.0–149.0)
VLDL: 24.8 mg/dL (ref 0.0–40.0)

## 2015-09-16 LAB — HEPATIC FUNCTION PANEL
ALBUMIN: 4.2 g/dL (ref 3.5–5.2)
ALT: 16 U/L (ref 0–53)
AST: 15 U/L (ref 0–37)
Alkaline Phosphatase: 75 U/L (ref 39–117)
BILIRUBIN DIRECT: 0 mg/dL (ref 0.0–0.3)
TOTAL PROTEIN: 8.2 g/dL (ref 6.0–8.3)
Total Bilirubin: 0.6 mg/dL (ref 0.2–1.2)

## 2015-09-16 LAB — CBC WITH DIFFERENTIAL/PLATELET
BASOS ABS: 0 10*3/uL (ref 0.0–0.1)
Basophils Relative: 0.2 % (ref 0.0–3.0)
Eosinophils Absolute: 0.1 10*3/uL (ref 0.0–0.7)
Eosinophils Relative: 1.3 % (ref 0.0–5.0)
HEMATOCRIT: 43.1 % (ref 39.0–52.0)
Hemoglobin: 14.4 g/dL (ref 13.0–17.0)
LYMPHS PCT: 20.2 % (ref 12.0–46.0)
Lymphs Abs: 2 10*3/uL (ref 0.7–4.0)
MCHC: 33.4 g/dL (ref 30.0–36.0)
MCV: 95.2 fl (ref 78.0–100.0)
MONOS PCT: 7.3 % (ref 3.0–12.0)
Monocytes Absolute: 0.7 10*3/uL (ref 0.1–1.0)
NEUTROS ABS: 7.1 10*3/uL (ref 1.4–7.7)
Neutrophils Relative %: 71 % (ref 43.0–77.0)
Platelets: 385 10*3/uL (ref 150.0–400.0)
RBC: 4.53 Mil/uL (ref 4.22–5.81)
RDW: 13.9 % (ref 11.5–15.5)
WBC: 10 10*3/uL (ref 4.0–10.5)

## 2015-09-16 LAB — URINALYSIS, ROUTINE W REFLEX MICROSCOPIC
Bilirubin Urine: NEGATIVE
HGB URINE DIPSTICK: NEGATIVE
KETONES UR: NEGATIVE
LEUKOCYTES UA: NEGATIVE
Nitrite: NEGATIVE
Specific Gravity, Urine: 1.01 (ref 1.000–1.030)
Total Protein, Urine: NEGATIVE
URINE GLUCOSE: NEGATIVE
UROBILINOGEN UA: 0.2 (ref 0.0–1.0)
pH: 6.5 (ref 5.0–8.0)

## 2015-09-16 LAB — BASIC METABOLIC PANEL
BUN: 18 mg/dL (ref 6–23)
CALCIUM: 9.8 mg/dL (ref 8.4–10.5)
CO2: 29 meq/L (ref 19–32)
CREATININE: 0.95 mg/dL (ref 0.40–1.50)
Chloride: 96 mEq/L (ref 96–112)
GFR: 80.52 mL/min (ref >60.00)
Glucose, Bld: 92 mg/dL (ref 70–99)
Potassium: 3.7 mEq/L (ref 3.5–5.1)
Sodium: 136 mEq/L (ref 135–145)

## 2015-09-16 LAB — URIC ACID: URIC ACID, SERUM: 4.9 mg/dL (ref 4.0–7.8)

## 2015-09-16 LAB — TSH: TSH: 4.87 u[IU]/mL — ABNORMAL HIGH (ref 0.35–4.50)

## 2015-09-16 MED ORDER — TRAMADOL HCL 50 MG PO TABS: 50.0000 mg | ORAL_TABLET | Freq: Three times a day (TID) | ORAL | Status: DC | PRN

## 2015-09-16 MED ORDER — ALLOPURINOL 100 MG PO TABS: 100.0000 mg | ORAL_TABLET | Freq: Every day | ORAL | Status: DC

## 2015-09-16 NOTE — Assessment & Plan Note (Signed)
Overall doing well, age appropriate education and counseling updated, referrals for preventative services and immunizations addressed, dietary and smoking counseling addressed, most recent labs reviewed.  I have personally reviewed and have noted:  1) the patient's medical and social history 2) The pt's use of alcohol, tobacco, and illicit drugs 3) The patient's current medications and supplements 4) Functional ability including ADL's, fall risk, home safety risk, hearing and visual impairment 5) Diet and physical activities 6) Evidence for depression or mood disorder 7) The patient's height, weight, and BMI have been recorded in the chart  I have made referrals, and provided counseling and education based on review of the above ECG reviewed as per emr Declines flu shot

## 2015-09-16 NOTE — Assessment & Plan Note (Signed)
imroved spontaneously, but ok for alloupurinol for prevention 100 qd, check uric acid level

## 2015-09-16 NOTE — Assessment & Plan Note (Addendum)
stable overall by history and exam, recent data reviewed with pt, and pt to continue medical treatment as before,  to f/u any worsening symptoms or concerns, not orthostatic - to cont the hct  BP Readings from Last 3 Encounters:  09/16/15 124/78  09/08/15 141/74  05/20/15 110/50
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
## 2015-09-16 NOTE — Assessment & Plan Note (Signed)
Marked pain, for tramadol prn, also rib films/cxr

## 2015-09-16 NOTE — Assessment & Plan Note (Signed)
Also for carotids and echo given fall with injury, consider cardiology referral but no CP, sob

## 2015-09-16 NOTE — Patient Instructions (Addendum)
You had the flu shot today  Your EKG was OK today  Please take all new medication as prescribed - the allopurinol at 100 mg per day, and tramadol for pain if needed  Please continue all other medications as before, and refills have been done if requested.  Please have the pharmacy call with any other refills you may need.  Please continue your efforts at being more active, low cholesterol diet, and weight control.  You are otherwise up to date with prevention measures today.  Please keep your appointments with your specialists as you may have planned  You will be contacted regarding the referral for: Echocardiogram, and carotid artery ultrasound testing  Please go to the XRAY Department in the Basement (go straight as you get off the elevator) for the x-ray testing  Please go to the LAB in the Basement (turn left off the elevator) for the tests to be done today  You will be contacted by phone if any changes need to be made immediately.  Otherwise, you will receive a letter about your results with an explanation, but please check with MyChart first.  Please remember to sign up for MyChart if you have not done so, as this will be important to you in the future with finding out test results, communicating by private email, and scheduling acute appointments online when needed.  Please return in 3 months, or sooner if needed

## 2015-09-16 NOTE — Progress Notes (Signed)
Pre visit review using our clinic review tool, if applicable. No additional management support is needed unless otherwise documented below in the visit note. 

## 2015-09-16 NOTE — Assessment & Plan Note (Signed)
To consider neruology and/or neurorehab if persists

## 2015-09-26 DIAGNOSIS — Z0000 Encounter for general adult medical examination without abnormal findings: Secondary | ICD-10-CM | POA: Diagnosis not present

## 2015-09-26 DIAGNOSIS — N320 Bladder-neck obstruction: Secondary | ICD-10-CM | POA: Diagnosis not present

## 2015-09-29 ENCOUNTER — Ambulatory Visit (HOSPITAL_COMMUNITY): Payer: Medicare Other | Attending: Cardiovascular Disease

## 2015-09-29 ENCOUNTER — Other Ambulatory Visit: Payer: Self-pay

## 2015-09-29 ENCOUNTER — Encounter: Payer: Self-pay | Admitting: Internal Medicine

## 2015-09-29 DIAGNOSIS — I371 Nonrheumatic pulmonary valve insufficiency: Secondary | ICD-10-CM | POA: Diagnosis not present

## 2015-09-29 DIAGNOSIS — I10 Essential (primary) hypertension: Secondary | ICD-10-CM | POA: Diagnosis not present

## 2015-09-29 DIAGNOSIS — I071 Rheumatic tricuspid insufficiency: Secondary | ICD-10-CM | POA: Insufficient documentation

## 2015-09-29 DIAGNOSIS — I517 Cardiomegaly: Secondary | ICD-10-CM | POA: Insufficient documentation

## 2015-09-29 DIAGNOSIS — R42 Dizziness and giddiness: Secondary | ICD-10-CM

## 2015-09-29 DIAGNOSIS — Y92099 Unspecified place in other non-institutional residence as the place of occurrence of the external cause: Secondary | ICD-10-CM | POA: Diagnosis not present

## 2015-09-29 DIAGNOSIS — W19XXXA Unspecified fall, initial encounter: Secondary | ICD-10-CM

## 2015-09-29 DIAGNOSIS — Y92009 Unspecified place in unspecified non-institutional (private) residence as the place of occurrence of the external cause: Secondary | ICD-10-CM

## 2015-10-19 ENCOUNTER — Other Ambulatory Visit: Payer: Self-pay | Admitting: Internal Medicine

## 2015-10-19 ENCOUNTER — Ambulatory Visit (HOSPITAL_COMMUNITY)
Admission: RE | Admit: 2015-10-19 | Discharge: 2015-10-19 | Disposition: A | Discharge status: Home / Self Care | Payer: Medicare Other | Source: Ambulatory Visit | Attending: Internal Medicine | Admitting: Internal Medicine

## 2015-10-19 DIAGNOSIS — R42 Dizziness and giddiness: Secondary | ICD-10-CM | POA: Insufficient documentation

## 2015-10-19 DIAGNOSIS — Y92009 Unspecified place in unspecified non-institutional (private) residence as the place of occurrence of the external cause: Secondary | ICD-10-CM

## 2015-10-19 DIAGNOSIS — Y92099 Unspecified place in other non-institutional residence as the place of occurrence of the external cause: Secondary | ICD-10-CM | POA: Diagnosis not present

## 2015-10-19 DIAGNOSIS — I10 Essential (primary) hypertension: Secondary | ICD-10-CM | POA: Diagnosis not present

## 2015-10-19 DIAGNOSIS — E785 Hyperlipidemia, unspecified: Secondary | ICD-10-CM | POA: Diagnosis not present

## 2015-10-19 DIAGNOSIS — I6523 Occlusion and stenosis of bilateral carotid arteries: Secondary | ICD-10-CM | POA: Insufficient documentation

## 2015-10-19 DIAGNOSIS — W19XXXA Unspecified fall, initial encounter: Secondary | ICD-10-CM | POA: Diagnosis not present

## 2015-10-20 ENCOUNTER — Encounter: Payer: Self-pay | Admitting: Internal Medicine

## 2015-11-22 ENCOUNTER — Telehealth: Payer: Self-pay | Admitting: Internal Medicine

## 2015-11-22 NOTE — Telephone Encounter (Signed)
Patient Name: Osten Mitchell  DOB: 10/04/1930    Initial Comment Caller States husband is real dizzy, cant stand up    Nurse Assessment  Nurse: Renaldo FiddlerAdkins, RN, Raynelle FanningJulie Date/Time Lamount Cohen(Eastern Time): 11/22/2015 1:12:14 PM  Confirm and document reason for call. If symptomatic, describe symptoms. You must click the next button to save text entered. ---Caller states her husband has been dizzy for the last couple of days but today he is much worse. States he has had sx like this before , he had a heart cath and a work up and everything was "normal" States he can get up and walk but "does not feel right". and he doesn't "look good"  Has the patient traveled out of the country within the last 30 days? ---Not Applicable  Does the patient have any new or worsening symptoms? ---Yes  Will a triage be completed? ---Yes  Related visit to physician within the last 2 weeks? ---No  Does the PT have any chronic conditions? (i.e. diabetes, asthma, etc.) ---Yes  List chronic conditions. ---Hx dizziness/cardiac work-up 2 mths ago  Is this a behavioral health or substance abuse call? ---No     Guidelines    Guideline Title Affirmed Question Affirmed Notes  Dizziness - Lightheadedness SEVERE dizziness (e.g., unable to stand, requires support to walk, feels like passing out now)    Final Disposition User   Go to ED Now (or PCP triage) Renaldo FiddlerAdkins, RN, Raynelle FanningJulie    Referrals  [facility]   Disagree/Comply: Comply

## 2015-11-23 ENCOUNTER — Ambulatory Visit (INDEPENDENT_AMBULATORY_CARE_PROVIDER_SITE_OTHER): Payer: Medicare Other | Admitting: Internal Medicine

## 2015-11-23 ENCOUNTER — Encounter: Payer: Self-pay | Admitting: Internal Medicine

## 2015-11-23 VITALS — BP 144/80 | HR 69 | Temp 97.8°F | Resp 20 | Wt 170.0 lb

## 2015-11-23 DIAGNOSIS — R42 Dizziness and giddiness: Secondary | ICD-10-CM | POA: Diagnosis not present

## 2015-11-23 DIAGNOSIS — E785 Hyperlipidemia, unspecified: Secondary | ICD-10-CM

## 2015-11-23 DIAGNOSIS — I10 Essential (primary) hypertension: Secondary | ICD-10-CM

## 2015-11-23 MED ORDER — MECLIZINE HCL 25 MG PO TABS: 25.0000 mg | ORAL_TABLET | Freq: Three times a day (TID) | ORAL | Status: DC | PRN

## 2015-11-23 NOTE — Assessment & Plan Note (Signed)
C/w BPPV/peripheral vertigo, for meclizine prn,  to f/u any worsening symptoms or concerns

## 2015-11-23 NOTE — Assessment & Plan Note (Signed)
Tolerating statin well, o/w stable overall by history and exam, recent data reviewed with pt, and pt to continue medical treatment as before,  to f/u any worsening symptoms or concerns Lab Results  Component Value Date   LDLCALC 102* 09/16/2015
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
## 2015-11-23 NOTE — Patient Instructions (Signed)
Please take all new medication as prescribed - the meclizine as needed for dizziness  Please continue all other medications as before, and refills have been done if requested.  Please have the pharmacy call with any other refills you may need.  Please keep your appointments with your specialists as you may have planned   

## 2015-11-23 NOTE — Progress Notes (Signed)
Pre visit review using our clinic review tool, if applicable. No additional management support is needed unless otherwise documented below in the visit note. 

## 2015-11-23 NOTE — Assessment & Plan Note (Signed)
stable overall by history and exam, recent data reviewed with pt, and pt to continue medical treatment as before,  to f/u any worsening symptoms or concerns BP Readings from Last 3 Encounters:  11/23/15 144/80  09/16/15 124/78  09/08/15 141/74

## 2015-12-26 DIAGNOSIS — M25552 Pain in left hip: Secondary | ICD-10-CM | POA: Diagnosis not present

## 2015-12-26 MED FILL — traMADol HCL 50 MG TABS: 50 | 10 days supply | Qty: 30 | Fill #0

## 2015-12-30 DIAGNOSIS — M24152 Other articular cartilage disorders, left hip: Secondary | ICD-10-CM | POA: Diagnosis not present

## 2015-12-30 DIAGNOSIS — M94252 Chondromalacia, left hip: Secondary | ICD-10-CM | POA: Diagnosis not present

## 2015-12-30 DIAGNOSIS — M25452 Effusion, left hip: Secondary | ICD-10-CM | POA: Diagnosis not present

## 2016-01-06 DIAGNOSIS — M25552 Pain in left hip: Secondary | ICD-10-CM | POA: Diagnosis not present

## 2016-01-17 ENCOUNTER — Telehealth: Payer: Self-pay | Admitting: Internal Medicine

## 2016-01-20 ENCOUNTER — Ambulatory Visit: Payer: Self-pay | Admitting: Orthopedic Surgery

## 2016-01-25 ENCOUNTER — Ambulatory Visit: Payer: Self-pay | Admitting: Orthopedic Surgery
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
## 2016-01-27 ENCOUNTER — Encounter (HOSPITAL_COMMUNITY): Payer: Self-pay

## 2016-01-27 ENCOUNTER — Encounter (HOSPITAL_COMMUNITY)
Admission: RE | Admit: 2016-01-27 | Discharge: 2016-01-27 | Disposition: A | Discharge status: Home / Self Care | Payer: Medicare Other | Source: Ambulatory Visit | Attending: Orthopedic Surgery | Admitting: Orthopedic Surgery

## 2016-01-27 DIAGNOSIS — Z01812 Encounter for preprocedural laboratory examination: Secondary | ICD-10-CM | POA: Diagnosis not present

## 2016-01-27 DIAGNOSIS — M1612 Unilateral primary osteoarthritis, left hip: Secondary | ICD-10-CM | POA: Insufficient documentation

## 2016-01-27 DIAGNOSIS — Z0183 Encounter for blood typing: Secondary | ICD-10-CM | POA: Diagnosis not present

## 2016-01-27 HISTORY — DX: Anxiety disorder, unspecified: F41.9

## 2016-01-27 HISTORY — DX: Unspecified asthma, uncomplicated: J45.909

## 2016-01-27 LAB — BASIC METABOLIC PANEL
ANION GAP: 14 (ref 5–15)
BUN: 15 mg/dL (ref 6–20)
CALCIUM: 9.4 mg/dL (ref 8.9–10.3)
CO2: 25 mmol/L (ref 22–32)
CREATININE: 0.87 mg/dL (ref 0.61–1.24)
Chloride: 101 mmol/L (ref 101–111)
GFR calc Af Amer: >60 mL/min (ref >60)
GFR calc non Af Amer: >60 mL/min (ref >60)
GLUCOSE: 95 mg/dL (ref 65–99)
Potassium: 3.6 mmol/L (ref 3.5–5.1)
Sodium: 140 mmol/L (ref 135–145)

## 2016-01-27 LAB — ABO/RH: ABO/RH(D): A POS

## 2016-01-27 LAB — CBC
HCT: 39.3 % (ref 39.0–52.0)
HEMOGLOBIN: 13.1 g/dL (ref 13.0–17.0)
MCH: 30.7 pg (ref 26.0–34.0)
MCHC: 33.3 g/dL (ref 30.0–36.0)
MCV: 92 fL (ref 78.0–100.0)
Platelets: 308 10*3/uL (ref 150–400)
RBC: 4.27 MIL/uL (ref 4.22–5.81)
RDW: 14.2 % (ref 11.5–15.5)
WBC: 7.3 10*3/uL (ref 4.0–10.5)

## 2016-01-27 LAB — TYPE AND SCREEN
ABO/RH(D): A POS
Antibody Screen: NEGATIVE

## 2016-01-27 LAB — SURGICAL PCR SCREEN
MRSA, PCR: NEGATIVE
Staphylococcus aureus: NEGATIVE

## 2016-01-27 NOTE — Progress Notes (Addendum)
Medical clearance noted under media tab dated 01-17-16 from Dr Jonny RuizJohn. Clearance also noted from Dr Jonne Plyrosback his Dentist PCP is Dr. Oliver BarreJames Erik Jackson States he saw a cardiologist, but doesn't remember the Drs name.  Dr Erik GibneyJohn's office called and they were unable to find a cardiologist listed. Echo noted in epic from 09-29-15 Denies any chest pain. States he saw a Dr because he got dizzy and fell. EKG in epic from 09-16-15

## 2016-01-27 NOTE — Pre-Procedure Instructions (Signed)
Erik Jackson  01/27/2016      [facility] [place] - [address] Erik Dike[address] [place] [id number] Phone: [phone] Fax: [phone]    Your procedure is scheduled on May 26.  [facility] at [facility] A.M.  Call this number if you have problems the morning of surgery:  [phone]   Remember:  Do not eat food or drink liquids after midnight.  Take these medicines the morning of surgery with A SIP OF WATER allopurinol (Zyloprim), levothyroxine (Synthroid)    Stop taking aspirin, Ibuprofen, Advil, Motrin, Aleve, Herbal medications, Fish  Oil, BC's, Goody's   Do not wear jewelry, make-up or nail polish.  Do not wear lotions, powders, or perfumes.  You may wear deodorant.  Do not shave 48 hours prior to surgery.  Men may shave face and neck.  Do not bring valuables to the hospital.  [facility] is not [facility] for any belongings or valuables.  Contacts, dentures or bridgework may not be worn into surgery.  Leave your suitcase in the car.  After surgery it may be brought to your room.  For patients admitted to the hospital, discharge time will be determined by your treatment team.  Patients discharged the day of surgery will not be allowed to drive home.    Special instructions:  [place] - Preparing for Surgery  Before surgery, you can play an important role.  Because skin is not sterile, your skin needs to be as free of germs as possible.  You can reduce the number of germs on you skin by washing with CHG (chlorahexidine gluconate) soap before surgery.  CHG is an antiseptic cleaner which kills germs and bonds with the skin to continue killing germs even after washing.  Please DO NOT use if you have an allergy to CHG or antibacterial soaps.  If your skin becomes reddened/irritated stop using the CHG and inform your nurse when you arrive at Short Stay.  Do not shave (including legs and underarms) for at  least 48 hours prior to the first CHG shower.  You may shave your face.  Please follow these instructions carefully:   1.  Shower with CHG Soap the night before surgery and the    morning of Surgery.  2.  If you choose to wash your hair, wash your hair first as usual with your normal shampoo.  3.  After you shampoo, rinse your hair and body thoroughly to remove the  Shampoo.  4.  Use CHG as you would any other liquid soap.  You can apply chg directly  to the skin and wash gently with scrungie or a clean washcloth.  5.  Apply the CHG Soap to your body ONLY FROM THE NECK DOWN.    Do not use on open wounds or open sores.  Avoid contact with your eyes, ears, mouth and genitals (private parts).  Wash genitals (private parts)  with your normal soap.  6.  Wash thoroughly, paying special attention to the area where your surgery  will be performed.  7.  Thoroughly rinse your body with warm water from the neck down.  8.  DO NOT shower/wash with your normal soap after using and rinsing off the CHG Soap.  9.  Pat yourself dry with a clean towel.            10.  Wear clean pajamas.  11.  Place clean sheets on your bed the night of your first shower and do not  sleep with pets.  Day of Surgery  Do not apply any lotions/deoderants the morning of surgery.  Please wear clean clothes to the hospital/surgery center.     Please read over the following fact sheets that you were given. Pain Booklet, Coughing and Deep Breathing, Blood Transfusion Information, Total Joint Packet, MRSA Information and Surgical Site Infection Prevention, Incentive Spirometry

## 2016-02-02 MED ORDER — ACETAMINOPHEN 10 MG/ML IV SOLN
1000.0000 mg | INTRAVENOUS | Status: AC
  Administered 2016-02-03: 1000 mg via INTRAVENOUS
  Filled 2016-02-02: qty 100

## 2016-02-02 MED ORDER — TRANEXAMIC ACID 1000 MG/10ML IV SOLN
1000.0000 mg | INTRAVENOUS | Status: AC
  Administered 2016-02-03: 1000 mg via INTRAVENOUS
  Filled 2016-02-02: qty 10

## 2016-02-02 MED ORDER — SODIUM CHLORIDE 0.9 % IV SOLN: INTRAVENOUS | Status: DC

## 2016-02-02 MED ORDER — CEFAZOLIN SODIUM-DEXTROSE 2-4 GM/100ML-% IV SOLN
2.0000 g | INTRAVENOUS | Status: AC
  Administered 2016-02-03: 2 g via INTRAVENOUS
  Filled 2016-02-02: qty 100

## 2016-02-03 ENCOUNTER — Inpatient Hospital Stay (HOSPITAL_COMMUNITY): Payer: Medicare Other | Admitting: Certified Registered Nurse Anesthetist

## 2016-02-03 ENCOUNTER — Inpatient Hospital Stay (HOSPITAL_COMMUNITY): Payer: Medicare Other | Admitting: Emergency Medicine

## 2016-02-03 ENCOUNTER — Inpatient Hospital Stay (HOSPITAL_COMMUNITY): Payer: Medicare Other

## 2016-02-03 ENCOUNTER — Encounter (HOSPITAL_COMMUNITY)
Admission: RE | Disposition: A | Discharge status: Home Health | Payer: Self-pay | Source: Ambulatory Visit | Attending: Orthopedic Surgery

## 2016-02-03 ENCOUNTER — Inpatient Hospital Stay (HOSPITAL_COMMUNITY)
Admission: RE | Admit: 2016-02-03 | Discharge: 2016-02-04 | DRG: 470 | Disposition: A | Discharge status: Home Health | Payer: Medicare Other | Source: Ambulatory Visit | Attending: Orthopedic Surgery | Admitting: Orthopedic Surgery

## 2016-02-03 ENCOUNTER — Encounter (HOSPITAL_COMMUNITY): Payer: Self-pay | Admitting: *Deleted

## 2016-02-03 DIAGNOSIS — M1612 Unilateral primary osteoarthritis, left hip: Secondary | ICD-10-CM | POA: Diagnosis not present

## 2016-02-03 DIAGNOSIS — E039 Hypothyroidism, unspecified: Secondary | ICD-10-CM | POA: Diagnosis not present

## 2016-02-03 DIAGNOSIS — E785 Hyperlipidemia, unspecified: Secondary | ICD-10-CM | POA: Diagnosis present

## 2016-02-03 DIAGNOSIS — Z96642 Presence of left artificial hip joint: Secondary | ICD-10-CM | POA: Diagnosis not present

## 2016-02-03 DIAGNOSIS — Z972 Presence of dental prosthetic device (complete) (partial): Secondary | ICD-10-CM

## 2016-02-03 DIAGNOSIS — I10 Essential (primary) hypertension: Secondary | ICD-10-CM | POA: Diagnosis not present

## 2016-02-03 DIAGNOSIS — Z09 Encounter for follow-up examination after completed treatment for conditions other than malignant neoplasm: Secondary | ICD-10-CM

## 2016-02-03 DIAGNOSIS — Z7982 Long term (current) use of aspirin: Secondary | ICD-10-CM | POA: Diagnosis not present

## 2016-02-03 DIAGNOSIS — Z87891 Personal history of nicotine dependence: Secondary | ICD-10-CM | POA: Diagnosis not present

## 2016-02-03 DIAGNOSIS — Z800 Family history of malignant neoplasm of digestive organs: Secondary | ICD-10-CM | POA: Diagnosis not present

## 2016-02-03 DIAGNOSIS — Z419 Encounter for procedure for purposes other than remedying health state, unspecified: Secondary | ICD-10-CM

## 2016-02-03 DIAGNOSIS — M169 Osteoarthritis of hip, unspecified: Secondary | ICD-10-CM | POA: Diagnosis not present

## 2016-02-03 DIAGNOSIS — Z471 Aftercare following joint replacement surgery: Secondary | ICD-10-CM | POA: Diagnosis not present

## 2016-02-03 DIAGNOSIS — Z96641 Presence of right artificial hip joint: Secondary | ICD-10-CM | POA: Diagnosis present

## 2016-02-03 HISTORY — PX: TOTAL HIP ARTHROPLASTY: SHX124

## 2016-02-03 SURGERY — ARTHROPLASTY, HIP, TOTAL, ANTERIOR APPROACH
Anesthesia: Spinal | Site: Hip | Laterality: Left

## 2016-02-03 MED ORDER — CEFAZOLIN SODIUM 1-5 GM-% IV SOLN
1.0000 g | Freq: Four times a day (QID) | INTRAVENOUS | Status: AC
  Administered 2016-02-03 (×2): 1 g via INTRAVENOUS
  Filled 2016-02-03 (×2): qty 50

## 2016-02-03 MED ORDER — BUPIVACAINE-EPINEPHRINE 0.5% -1:200000 IJ SOLN
INTRAMUSCULAR | Status: DC | PRN
  Administered 2016-02-03: 30 mL

## 2016-02-03 MED ORDER — EPHEDRINE SULFATE-NACL 50-0.9 MG/10ML-% IV SOSY
PREFILLED_SYRINGE | INTRAVENOUS | Status: DC | PRN
  Administered 2016-02-03: 10 mg via INTRAVENOUS

## 2016-02-03 MED ORDER — METOCLOPRAMIDE HCL 5 MG/ML IJ SOLN: 5.0000 mg | Freq: Three times a day (TID) | INTRAMUSCULAR | Status: DC | PRN

## 2016-02-03 MED ORDER — ASPIRIN EC 81 MG PO TBEC
81.0000 mg | DELAYED_RELEASE_TABLET | Freq: Two times a day (BID) | ORAL | Status: DC
  Administered 2016-02-03 – 2016-02-04 (×2): 81 mg via ORAL
  Filled 2016-02-03 (×2): qty 1

## 2016-02-03 MED ORDER — ROCURONIUM BROMIDE 50 MG/5ML IV SOLN
INTRAVENOUS | Status: AC
  Filled 2016-02-03: qty 1

## 2016-02-03 MED ORDER — EPHEDRINE SULFATE 50 MG/ML IJ SOLN: INTRAMUSCULAR | Status: DC | PRN

## 2016-02-03 MED ORDER — DEXAMETHASONE SODIUM PHOSPHATE 10 MG/ML IJ SOLN
10.0000 mg | Freq: Once | INTRAMUSCULAR | Status: AC
  Administered 2016-02-04: 10 mg via INTRAVENOUS
  Filled 2016-02-03: qty 1

## 2016-02-03 MED ORDER — SENNA 8.6 MG PO TABS
2.0000 | ORAL_TABLET | Freq: Every day | ORAL | Status: DC
  Administered 2016-02-03: 17.2 mg via ORAL
  Filled 2016-02-03: qty 2

## 2016-02-03 MED ORDER — ALLOPURINOL 100 MG PO TABS
100.0000 mg | ORAL_TABLET | Freq: Every day | ORAL | Status: DC
Start: 2016-02-03 — End: 2016-02-04
  Administered 2016-02-03 – 2016-02-04 (×2): 100 mg via ORAL
  Filled 2016-02-03 (×2): qty 1

## 2016-02-03 MED ORDER — METHOCARBAMOL 1000 MG/10ML IJ SOLN
500.0000 mg | Freq: Four times a day (QID) | INTRAVENOUS | Status: DC | PRN
  Filled 2016-02-03: qty 5

## 2016-02-03 MED ORDER — 0.9 % SODIUM CHLORIDE (POUR BTL) OPTIME
TOPICAL | Status: DC | PRN
  Administered 2016-02-03 (×2): 1000 mL

## 2016-02-03 MED ORDER — ONDANSETRON HCL 4 MG PO TABS: 4.0000 mg | ORAL_TABLET | Freq: Four times a day (QID) | ORAL | Status: DC | PRN

## 2016-02-03 MED ORDER — OXYCODONE HCL 5 MG/5ML PO SOLN: 5.0000 mg | Freq: Once | ORAL | Status: DC | PRN

## 2016-02-03 MED ORDER — ACETAMINOPHEN 325 MG PO TABS: 650.0000 mg | ORAL_TABLET | Freq: Four times a day (QID) | ORAL | Status: DC | PRN

## 2016-02-03 MED ORDER — SODIUM CHLORIDE 0.9 % IR SOLN
Status: DC | PRN
  Administered 2016-02-03: 1000 mL

## 2016-02-03 MED ORDER — MENTHOL 3 MG MT LOZG: 1.0000 | LOZENGE | OROMUCOSAL | Status: DC | PRN

## 2016-02-03 MED ORDER — ACETAMINOPHEN 650 MG RE SUPP: 650.0000 mg | Freq: Four times a day (QID) | RECTAL | Status: DC | PRN

## 2016-02-03 MED ORDER — SUCCINYLCHOLINE CHLORIDE 200 MG/10ML IV SOSY
PREFILLED_SYRINGE | INTRAVENOUS | Status: AC
  Filled 2016-02-03: qty 10

## 2016-02-03 MED ORDER — ONDANSETRON HCL 4 MG/2ML IJ SOLN: 4.0000 mg | Freq: Four times a day (QID) | INTRAMUSCULAR | Status: DC | PRN

## 2016-02-03 MED ORDER — PROPOFOL 10 MG/ML IV BOLUS
INTRAVENOUS | Status: AC
  Filled 2016-02-03: qty 20

## 2016-02-03 MED ORDER — POVIDONE-IODINE 10 % EX SWAB: 2.0000 "application " | Freq: Once | CUTANEOUS | Status: DC

## 2016-02-03 MED ORDER — BUPIVACAINE-EPINEPHRINE (PF) 0.5% -1:200000 IJ SOLN
INTRAMUSCULAR | Status: AC
  Filled 2016-02-03: qty 30

## 2016-02-03 MED ORDER — KETOROLAC TROMETHAMINE 15 MG/ML IJ SOLN
7.5000 mg | Freq: Four times a day (QID) | INTRAMUSCULAR | Status: AC
  Administered 2016-02-03 – 2016-02-04 (×4): 7.5 mg via INTRAVENOUS
  Filled 2016-02-03 (×3): qty 1

## 2016-02-03 MED ORDER — SODIUM CHLORIDE 0.9 % IV SOLN
INTRAVENOUS | Status: DC
  Administered 2016-02-03 (×2): via INTRAVENOUS

## 2016-02-03 MED ORDER — HYDROCODONE-ACETAMINOPHEN 5-325 MG PO TABS
1.0000 | ORAL_TABLET | ORAL | Status: DC | PRN
  Administered 2016-02-03 – 2016-02-04 (×2): 2 via ORAL
  Filled 2016-02-03 (×2): qty 2

## 2016-02-03 MED ORDER — LISINOPRIL 20 MG PO TABS
20.0000 mg | ORAL_TABLET | Freq: Every day | ORAL | Status: DC
  Administered 2016-02-03 – 2016-02-04 (×2): 20 mg via ORAL
  Filled 2016-02-03 (×2): qty 1

## 2016-02-03 MED ORDER — OXYCODONE HCL 5 MG PO TABS: 5.0000 mg | ORAL_TABLET | Freq: Once | ORAL | Status: DC | PRN

## 2016-02-03 MED ORDER — SENNA 8.6 MG PO TABS: 2.0000 | ORAL_TABLET | Freq: Every day | ORAL | Status: DC

## 2016-02-03 MED ORDER — PHENOL 1.4 % MT LIQD
1.0000 | OROMUCOSAL | Status: DC | PRN
Start: 2016-02-03 — End: 2016-02-04

## 2016-02-03 MED ORDER — KETOROLAC TROMETHAMINE 30 MG/ML IJ SOLN
INTRAMUSCULAR | Status: DC | PRN
  Administered 2016-02-03: 30 mg via INTRAVENOUS

## 2016-02-03 MED ORDER — LACTATED RINGERS IV SOLN
INTRAVENOUS | Status: DC | PRN
  Administered 2016-02-03 (×2): via INTRAVENOUS

## 2016-02-03 MED ORDER — DOCUSATE SODIUM 100 MG PO CAPS: 100.0000 mg | ORAL_CAPSULE | Freq: Two times a day (BID) | ORAL | Status: DC

## 2016-02-03 MED ORDER — KETOROLAC TROMETHAMINE 15 MG/ML IJ SOLN
INTRAMUSCULAR | Status: AC
  Filled 2016-02-03: qty 1

## 2016-02-03 MED ORDER — CHLORHEXIDINE GLUCONATE 4 % EX LIQD: 60.0000 mL | Freq: Once | CUTANEOUS | Status: DC

## 2016-02-03 MED ORDER — TRANEXAMIC ACID 1000 MG/10ML IV SOLN
1000.0000 mg | Freq: Once | INTRAVENOUS | Status: AC
  Administered 2016-02-03: 1000 mg via INTRAVENOUS
  Filled 2016-02-03: qty 10

## 2016-02-03 MED ORDER — EPHEDRINE 5 MG/ML INJ
INTRAVENOUS | Status: AC
  Filled 2016-02-03: qty 10

## 2016-02-03 MED ORDER — DOCUSATE SODIUM 100 MG PO CAPS
100.0000 mg | ORAL_CAPSULE | Freq: Two times a day (BID) | ORAL | Status: DC
  Administered 2016-02-03 – 2016-02-04 (×2): 100 mg via ORAL
  Filled 2016-02-03 (×2): qty 1

## 2016-02-03 MED ORDER — SORBITOL 70 % SOLN: 30.0000 mL | Freq: Every day | Status: DC | PRN

## 2016-02-03 MED ORDER — PROPOFOL 500 MG/50ML IV EMUL
INTRAVENOUS | Status: DC | PRN
  Administered 2016-02-03: 75 ug/kg/min via INTRAVENOUS

## 2016-02-03 MED ORDER — HYDROMORPHONE HCL 1 MG/ML IJ SOLN: 0.5000 mg | INTRAMUSCULAR | Status: DC | PRN

## 2016-02-03 MED ORDER — PRAVASTATIN SODIUM 40 MG PO TABS
40.0000 mg | ORAL_TABLET | Freq: Every evening | ORAL | Status: DC
  Administered 2016-02-03: 40 mg via ORAL
  Filled 2016-02-03: qty 1

## 2016-02-03 MED ORDER — METHOCARBAMOL 500 MG PO TABS: 500.0000 mg | ORAL_TABLET | Freq: Four times a day (QID) | ORAL | Status: DC | PRN

## 2016-02-03 MED ORDER — BUPIVACAINE HCL (PF) 0.5 % IJ SOLN
INTRAMUSCULAR | Status: DC | PRN
  Administered 2016-02-03: 3 mL via INTRATHECAL

## 2016-02-03 MED ORDER — FENTANYL CITRATE (PF) 100 MCG/2ML IJ SOLN: 25.0000 ug | INTRAMUSCULAR | Status: DC | PRN

## 2016-02-03 MED ORDER — SODIUM CHLORIDE 0.9 % IJ SOLN
INTRAMUSCULAR | Status: DC | PRN
  Administered 2016-02-03 (×2): 10 mL via INTRAVENOUS
  Administered 2016-02-03: 10 mL

## 2016-02-03 MED ORDER — LEVOTHYROXINE SODIUM 50 MCG PO TABS
50.0000 ug | ORAL_TABLET | Freq: Every day | ORAL | Status: DC
  Administered 2016-02-04: 50 ug via ORAL
  Filled 2016-02-03: qty 1

## 2016-02-03 MED ORDER — ASPIRIN 81 MG PO TABS: 81.0000 mg | ORAL_TABLET | Freq: Two times a day (BID) | ORAL | Status: AC

## 2016-02-03 MED ORDER — FENTANYL CITRATE (PF) 250 MCG/5ML IJ SOLN
INTRAMUSCULAR | Status: AC
  Filled 2016-02-03: qty 5

## 2016-02-03 MED ORDER — METOCLOPRAMIDE HCL 5 MG PO TABS: 5.0000 mg | ORAL_TABLET | Freq: Three times a day (TID) | ORAL | Status: DC | PRN

## 2016-02-03 MED ORDER — MAGNESIUM CITRATE PO SOLN: 1.0000 | Freq: Once | ORAL | Status: DC | PRN

## 2016-02-03 MED ORDER — ONDANSETRON HCL 4 MG/2ML IJ SOLN
INTRAMUSCULAR | Status: AC
  Filled 2016-02-03: qty 2

## 2016-02-03 MED ORDER — MIDAZOLAM HCL 2 MG/2ML IJ SOLN
INTRAMUSCULAR | Status: AC
  Filled 2016-02-03: qty 2

## 2016-02-03 MED ORDER — HYDROCODONE-ACETAMINOPHEN 5-325 MG PO TABS: 1.0000 | ORAL_TABLET | ORAL | Status: DC | PRN

## 2016-02-03 MED ORDER — ONDANSETRON HCL 4 MG PO TABS: 4.0000 mg | ORAL_TABLET | Freq: Three times a day (TID) | ORAL | Status: DC | PRN

## 2016-02-03 MED ORDER — MIDAZOLAM HCL 5 MG/5ML IJ SOLN
INTRAMUSCULAR | Status: DC | PRN
  Administered 2016-02-03: 1 mg via INTRAVENOUS

## 2016-02-03 SURGICAL SUPPLY — 53 items
ALCOHOL ISOPROPYL (RUBBING) (MISCELLANEOUS) ×3 IMPLANT
BLADE SURG ROTATE 9660 (MISCELLANEOUS) IMPLANT
CAPT HIP TOTAL 2 ×3 IMPLANT
CHLORAPREP W/TINT 26ML (MISCELLANEOUS) ×3 IMPLANT
COVER SURGICAL LIGHT HANDLE (MISCELLANEOUS) ×9 IMPLANT
DERMABOND ADVANCED (GAUZE/BANDAGES/DRESSINGS) ×2
DERMABOND ADVANCED .7 DNX12 (GAUZE/BANDAGES/DRESSINGS) ×1 IMPLANT
DRAPE C-ARM 42X72 X-RAY (DRAPES) ×3 IMPLANT
DRAPE IMP U-DRAPE 54X76 (DRAPES) ×6 IMPLANT
DRAPE STERI IOBAN 125X83 (DRAPES) ×3 IMPLANT
DRAPE U-SHAPE 47X51 STRL (DRAPES) ×9 IMPLANT
DRSG AQUACEL AG ADV 3.5X10 (GAUZE/BANDAGES/DRESSINGS) ×3 IMPLANT
ELECT BLADE 4.0 EZ CLEAN MEGAD (MISCELLANEOUS) ×3
ELECT REM PT RETURN 9FT ADLT (ELECTROSURGICAL) ×3
ELECTRODE BLDE 4.0 EZ CLN MEGD (MISCELLANEOUS) ×1 IMPLANT
ELECTRODE REM PT RTRN 9FT ADLT (ELECTROSURGICAL) ×1 IMPLANT
EVACUATOR 1/8 PVC DRAIN (DRAIN) IMPLANT
GLOVE BIO SURGEON STRL SZ8.5 (GLOVE) ×9 IMPLANT
GLOVE BIOGEL PI IND STRL 8.5 (GLOVE) ×1 IMPLANT
GLOVE BIOGEL PI INDICATOR 8.5 (GLOVE) ×2
GOWN STRL REUS W/ TWL LRG LVL3 (GOWN DISPOSABLE) ×2 IMPLANT
GOWN STRL REUS W/TWL 2XL LVL3 (GOWN DISPOSABLE) ×3 IMPLANT
GOWN STRL REUS W/TWL LRG LVL3 (GOWN DISPOSABLE) ×4
HANDPIECE INTERPULSE COAX TIP (DISPOSABLE) ×2
HOOD PEEL AWAY FACE SHEILD DIS (HOOD) ×6 IMPLANT
KIT BASIN OR (CUSTOM PROCEDURE TRAY) ×3 IMPLANT
KIT ROOM TURNOVER OR (KITS) ×3 IMPLANT
MANIFOLD NEPTUNE II (INSTRUMENTS) ×3 IMPLANT
MARKER SKIN DUAL TIP RULER LAB (MISCELLANEOUS) ×6 IMPLANT
NEEDLE SPNL 18GX3.5 QUINCKE PK (NEEDLE) ×3 IMPLANT
NS IRRIG 1000ML POUR BTL (IV SOLUTION) ×3 IMPLANT
PACK TOTAL JOINT (CUSTOM PROCEDURE TRAY) ×3 IMPLANT
PACK UNIVERSAL I (CUSTOM PROCEDURE TRAY) ×3 IMPLANT
PAD ARMBOARD 7.5X6 YLW CONV (MISCELLANEOUS) ×6 IMPLANT
SAW OSC TIP CART 19.5X105X1.3 (SAW) ×3 IMPLANT
SEALER BIPOLAR AQUA 6.0 (INSTRUMENTS) ×3 IMPLANT
SET HNDPC FAN SPRY TIP SCT (DISPOSABLE) ×1 IMPLANT
SOLUTION BETADINE 4OZ (MISCELLANEOUS) ×3 IMPLANT
SUCTION FRAZIER HANDLE 10FR (MISCELLANEOUS) ×2
SUCTION TUBE FRAZIER 10FR DISP (MISCELLANEOUS) ×1 IMPLANT
SUT ETHIBOND NAB CT1 #1 30IN (SUTURE) ×6 IMPLANT
SUT MNCRL AB 3-0 PS2 18 (SUTURE) ×3 IMPLANT
SUT MON AB 2-0 CT1 36 (SUTURE) ×3 IMPLANT
SUT VIC AB 1 CT1 27 (SUTURE) ×2
SUT VIC AB 1 CT1 27XBRD ANBCTR (SUTURE) ×1 IMPLANT
SUT VIC AB 2-0 CT1 27 (SUTURE) ×2
SUT VIC AB 2-0 CT1 TAPERPNT 27 (SUTURE) ×1 IMPLANT
SUT VLOC 180 0 24IN GS25 (SUTURE) ×3 IMPLANT
SYR 50ML LL SCALE MARK (SYRINGE) ×3 IMPLANT
TOWEL OR 17X24 6PK STRL BLUE (TOWEL DISPOSABLE) ×3 IMPLANT
TOWEL OR 17X26 10 PK STRL BLUE (TOWEL DISPOSABLE) ×3 IMPLANT
TRAY FOLEY CATH 16FR SILVER (SET/KITS/TRAYS/PACK) ×3 IMPLANT
WATER STERILE IRR 1000ML POUR (IV SOLUTION) IMPLANT
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
## 2016-02-03 NOTE — Discharge Instructions (Signed)
°Dr. Liany Mumpower °Joint Replacement Specialist °[facility] °[address] °[place], [place] [address] °[phone] ° ° °TOTAL HIP REPLACEMENT POSTOPERATIVE DIRECTIONS ° ° ° °Hip Rehabilitation, Guidelines Following Surgery  ° °WEIGHT BEARING °Weight bearing as tolerated with assist device (walker, cane, etc) as directed, use it as long as suggested by your surgeon or therapist, typically at least 4-6 weeks. ° °The results of a hip operation are greatly improved after range of motion and muscle strengthening exercises. Follow all safety measures which are given to protect your hip. If any of these exercises cause increased pain or swelling in your joint, decrease the amount until you are comfortable again. Then slowly increase the exercises. Call your caregiver if you have problems or questions.  ° °HOME CARE INSTRUCTIONS  °Most of the following instructions are designed to prevent the dislocation of your new hip.  °Remove items at home which could result in a fall. This includes throw rugs or furniture in walking pathways.  °Continue medications as instructed at time of discharge. °· You may have some home medications which will be placed on hold until you complete the course of blood thinner medication. °· You may start showering once you are discharged home. Do not remove your dressing. °Do not put on socks or shoes without following the instructions of your caregivers.   °Sit on chairs with arms. Use the chair arms to help push yourself up when arising.  °Arrange for the use of a toilet seat elevator so you are not sitting low.  °· Walk with walker as instructed.  °You may resume a sexual relationship in one month or when given the OK by your caregiver.  °Use walker as long as suggested by your caregivers.  °You may put full weight on your legs and walk as much as is comfortable. °Avoid periods of inactivity such as sitting longer than an hour when not asleep. This helps prevent  blood clots.  °You may return to work once you are cleared by your surgeon.  °Do not drive a car for 6 weeks or until released by your surgeon.  °Do not drive while taking narcotics.  °Wear elastic stockings for two weeks following surgery during the day but you may remove then at night.  °Make sure you keep all of your appointments after your operation with all of your doctors and caregivers. You should call the office at the above phone number and make an appointment for approximately two weeks after the date of your surgery. °Please pick up a stool softener and laxative for home use as long as you are requiring pain medications. °· ICE to the affected hip every three hours for 30 minutes at a time and then as needed for pain and swelling. Continue to use ice on the hip for pain and swelling from surgery. You may notice swelling that will progress down to the foot and ankle.  This is normal after surgery.  Elevate the leg when you are not up walking on it.   °It is important for you to complete the blood thinner medication as prescribed by your doctor. °· Continue to use the breathing machine which will help keep your temperature down.  It is common for your temperature to cycle up and down following surgery, especially at night when you are not up moving around and exerting yourself.  The breathing machine keeps your lungs expanded and your temperature down. ° °RANGE OF MOTION AND STRENGTHENING EXERCISES  °These exercises are   designed to help you keep full movement of your hip joint. Follow your caregiver's or physical therapist's instructions. Perform all exercises about fifteen times, three times per day or as directed. Exercise both hips, even if you have had only one joint replacement. These exercises can be done on a training (exercise) mat, on the floor, on a table or on a bed. Use whatever works the best and is most comfortable for you. Use music or television while you are exercising so that the exercises  are a pleasant break in your day. This will make your life better with the exercises acting as a break in routine you can look forward to.  °Lying on your back, slowly slide your foot toward your buttocks, raising your knee up off the floor. Then slowly slide your foot back down until your leg is straight again.  °Lying on your back spread your legs as far apart as you can without causing discomfort.  °Lying on your side, raise your upper leg and foot straight up from the floor as far as is comfortable. Slowly lower the leg and repeat.  °Lying on your back, tighten up the muscle in the front of your thigh (quadriceps muscles). You can do this by keeping your leg straight and trying to raise your heel off the floor. This helps strengthen the largest muscle supporting your knee.  °Lying on your back, tighten up the muscles of your buttocks both with the legs straight and with the knee bent at a comfortable angle while keeping your heel on the floor.  ° °SKILLED REHAB INSTRUCTIONS: °If the patient is transferred to a skilled rehab facility following release from the hospital, a list of the current medications will be sent to the facility for the patient to continue.  When discharged from the skilled rehab facility, please have the facility set up the patient's Home Health Physical Therapy prior to being released. Also, the skilled facility will be responsible for providing the patient with their medications at time of release from the facility to include their pain medication and their blood thinner medication. If the patient is still at the rehab facility at time of the two week follow up appointment, the skilled rehab facility will also need to assist the patient in arranging follow up appointment in our office and any transportation needs. ° °MAKE SURE YOU:  °Understand these instructions.  °Will watch your condition.  °Will get help right away if you are not doing well or get worse. ° °Pick up stool softner and  laxative for home use following surgery while on pain medications. °Do not remove your dressing. °The dressing is waterproof--it is OK to take showers. °Continue to use ice for pain and swelling after surgery. °Do not use any lotions or creams on the incision until instructed by your surgeon. °Total Hip Protocol. ° ° °
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
## 2016-02-03 NOTE — Anesthesia Procedure Notes (Addendum)
Procedure Name: MAC Date/Time: 02/03/2016 7:42 AM Performed by: Rogelia BogaMUELLER, THOMAS P Pre-anesthesia Checklist: Patient identified, Emergency Drugs available, Suction available, Patient being monitored and Timeout performed Oxygen Delivery Method: Simple face mask   Spinal Patient location during procedure: OR Staffing Anesthesiologist: Monte Zinni Preanesthetic Checklist Completed: patient identified, surgical consent, pre-op evaluation, timeout performed, IV checked, risks and benefits discussed and monitors and equipment checked Spinal Block Patient position: sitting Prep: site prepped and draped and DuraPrep Patient monitoring: heart rate, cardiac monitor, continuous pulse ox and blood pressure Approach: midline Location: L3-4 Injection technique: single-shot Needle Needle type: Pencan  Needle gauge: 24 G Needle length: 10 cm Assessment Sensory level: T8

## 2016-02-03 NOTE — Anesthesia Postprocedure Evaluation (Signed)
Anesthesia Post Note  Patient: Erik Jackson  Procedure(s) Performed: Procedure(s) (LRB): LEFT TOTAL HIP ARTHROPLASTY ANTERIOR APPROACH (Left)  Patient location during evaluation: PACU Anesthesia Type: Spinal and MAC Level of consciousness: awake Pain management: pain level controlled Vital Signs Assessment: post-procedure vital signs reviewed and stable Cardiovascular status: stable Postop Assessment: no signs of nausea or vomiting and spinal receding Anesthetic complications: no    Last Vitals:  Filed Vitals:   02/03/16 1245 02/03/16 1300  BP:  122/65  Pulse: 52 51  Temp: 36.3 C 36.3 C  Resp: 13 16    Last Pain:  Filed Vitals:   02/03/16 1640  PainSc: 3                  Kimorah Ridolfi

## 2016-02-03 NOTE — Anesthesia Preprocedure Evaluation (Addendum)
Anesthesia Evaluation  Patient identified by MRN, date of birth, ID band Patient awake    Reviewed: Allergy & Precautions, NPO status , Patient's Chart, lab work & pertinent test results  History of Anesthesia Complications (+) PROLONGED EMERGENCE and history of anesthetic complications  Airway Mallampati: II  TM Distance: >3 FB Neck ROM: Full    Dental  (+) Upper Dentures   Pulmonary former smoker,    breath sounds clear to auscultation       Cardiovascular hypertension, Pt. on medications (-) angina(-) Past MI and (-) CHF  Rhythm:Regular     Neuro/Psych PSYCHIATRIC DISORDERS Anxiety negative neurological ROS     GI/Hepatic negative GI ROS, Neg liver ROS,   Endo/Other  Hypothyroidism   Renal/GU negative Renal ROS     Musculoskeletal  (+) Arthritis ,   Abdominal   Peds  Hematology negative hematology ROS (+)   Anesthesia Other Findings   Reproductive/Obstetrics                            Anesthesia Physical Anesthesia Plan  ASA: III  Anesthesia Plan: Spinal   Post-op Pain Management:    Induction: Intravenous  Airway Management Planned: Natural Airway, Nasal Cannula and Simple Face Mask  Additional Equipment: None  Intra-op Plan:   Post-operative Plan:   Informed Consent: I have reviewed the patients History and Physical, chart, labs and discussed the procedure including the risks, benefits and alternatives for the proposed anesthesia with the patient or authorized representative who has indicated his/her understanding and acceptance.   Dental advisory given  Plan Discussed with: CRNA, Surgeon and Anesthesiologist  Anesthesia Plan Comments:        Anesthesia Quick Evaluation

## 2016-02-03 NOTE — Evaluation (Signed)
Physical Therapy Evaluation Patient Details Name: Erik Jackson MRN: [id number] DOB: 12/19/1930 Today's Date: 02/03/2016   History of Present Illness  Pt is an 82 y/o male who presents s/p L THA direct anterior approach on 02/02/16. Pt is WBAT on the L side.   Clinical Impression  This patient presents with acute pain and decreased functional independence following the above mentioned procedure. At the time of PT eval, pt was able to perform transfers and ambulation with min guard to supervision for safety. Pt slightly impulsive to initiate transfers, however feel this is more a communication issue than true impulsivity as pt is HOH. Pt's goal is to return home tomorrow after afternoon PT session. This patient is appropriate for skilled PT interventions to address functional limitations, improve safety and independence with functional mobility, and return to PLOF.     Follow Up Recommendations Home health PT;Supervision for mobility/OOB    Equipment Recommendations  None recommended by PT    Recommendations for Other Services       Precautions / Restrictions Precautions Precautions: Fall Precaution Comments: Direct anterior approach Restrictions Weight Bearing Restrictions: Yes LLE Weight Bearing: Weight bearing as tolerated      Mobility  Bed Mobility Overal bed mobility: Needs Assistance Bed Mobility: Supine to Sit     Supine to sit: Supervision     General bed mobility comments: Use of rails required. Pt was able to transition to EOB without assistance.   Transfers Overall transfer level: Needs assistance Equipment used: None Transfers: Sit to/from Stand Sit to Stand: Supervision         General transfer comment: Pt impulsive to stand initially and was able to power-up to full standing without assist and without UE support once standing. Pt was cued to sit back down until walker was in front of him.   Ambulation/Gait Ambulation/Gait assistance: Min  guard Ambulation Distance (Feet): 75 Feet Assistive device: Rolling walker (2 wheeled) Gait Pattern/deviations: Step-through pattern;Decreased stride length;Trunk flexed;Decreased weight shift to left Gait velocity: Decreased Gait velocity interpretation: Below normal speed for age/gender General Gait Details: VC's for improved posture and increased step/stride length on the L side. Pt states pain is manageable and is motivated for distance.   Stairs            Wheelchair Mobility    Modified Rankin (Stroke Patients Only)       Balance Overall balance assessment: Needs assistance Sitting-balance support: Feet supported;No upper extremity supported Sitting balance-Leahy Scale: Good     Standing balance support: No upper extremity supported;During functional activity Standing balance-Leahy Scale: Fair Standing balance comment: Pt was able to stand and take a few steps impulsively without UE support. Continue to recommend pt have RW for now.                              Pertinent Vitals/Pain Pain Assessment: 0-10 Pain Score: 3  Pain Location: Hip Pain Descriptors / Indicators: Operative site guarding Pain Intervention(s): Limited activity within patient's tolerance;Monitored during session;Repositioned    Home Living Family/patient expects to be discharged to:: Private residence Living Arrangements: Spouse/significant other Available Help at Discharge: Family;Available 24 hours/day Type of Home: House Home Access: Stairs to enter Entrance Stairs-Rails: Right Entrance Stairs-Number of Steps: 3 Home Layout: One level Home Equipment: Emergency planning/management officer - 2 wheels      Prior Function Level of Independence: Independent with assistive device(s)         Comments:  Uses wife's rollator occasionally for longer distances when he is having a "bad day". Was able to complete all bathing/dressing independently.      Hand Dominance   Dominant Hand: Right     Extremity/Trunk Assessment   Upper Extremity Assessment: Defer to OT evaluation;Overall [facility] Hospital[facility]FL for tasks assessed           Lower Extremity Assessment: LLE deficits/detail   LLE Deficits / Details: Decreased strength and AROM consistent with above mentioned procedure.   Cervical / Trunk Assessment: Kyphotic  Communication   Communication: HOH  Cognition Arousal/Alertness: Awake/alert Behavior During Therapy: WFL for tasks assessed/performed;Impulsive Overall Cognitive Status: Within Functional Limits for tasks assessed                      General Comments      Exercises        Assessment/Plan    PT Assessment Patient needs continued PT services  PT Diagnosis Difficulty walking;Acute pain   PT Problem List Decreased strength;Decreased range of motion;Decreased activity tolerance;Decreased balance;Decreased mobility;Decreased knowledge of use of DME;Decreased safety awareness;Decreased knowledge of precautions;Pain  PT Treatment Interventions DME instruction;Gait training;Stair training;Functional mobility training;Therapeutic activities;Therapeutic exercise;Neuromuscular re-education;Patient/family education   PT Goals (Current goals can be found in the Care Plan section) Acute Rehab PT Goals Patient Stated Goal: Home tomorrow after afternoon PT session PT Goal Formulation: With patient/family Time For Goal Achievement: 02/10/16 Potential to Achieve Goals: Good    Frequency 7X/week   Barriers to discharge        Co-evaluation               End of Session Equipment Utilized During Treatment: Gait belt Activity Tolerance: Patient tolerated treatment well Patient left: in chair;with call bell/phone within reach;with chair alarm set;with family/visitor present Nurse Communication: Mobility status (RN observed pt walking in hall)         Time: 1405-1430 PT Time Calculation (min) (ACUTE ONLY): 25 min   Charges:   PT Evaluation $PT Eval Moderate  Complexity: 1 Procedure PT Treatments $Gait Training: 8-22 mins   PT G Codes:        Conni SlipperKirkman, Honour Schwieger 02/03/2016, 2:45 PM   Conni SlipperLaura Princeston Blizzard, PT, DPT Acute Rehabilitation Services Pager: [phone]

## 2016-02-03 NOTE — Transfer of Care (Signed)
Immediate Anesthesia Transfer of Care Note  Patient: Erik Jackson  Procedure(s) Performed: Procedure(s): LEFT TOTAL HIP ARTHROPLASTY ANTERIOR APPROACH (Left)  Patient Location: PACU  Anesthesia Type:MAC and Spinal  Level of Consciousness: awake, alert , oriented and patient cooperative  Airway & Oxygen Therapy: Patient Spontanous Breathing and Patient connected to nasal cannula oxygen  Post-op Assessment: Report given to RN and Post -op Vital signs reviewed and stable  Post vital signs: Reviewed and stable  Last Vitals:  Filed Vitals:   02/03/16 0609  BP: 142/64  Pulse: 55  Temp: 36.9 C  Resp: 20    Last Pain: There were no vitals filed for this visit.       Complications: No apparent anesthesia complications

## 2016-02-03 NOTE — Progress Notes (Signed)
Utilization review completed.  

## 2016-02-03 NOTE — Interval H&P Note (Signed)
History and Physical Interval Note:  02/03/2016 7:20 AM  Erik Jackson  has presented today for surgery, with the diagnosis of LEFT HIP OA  The various methods of treatment have been discussed with the patient and family. After consideration of risks, benefits and other options for treatment, the patient has consented to  Procedure(s): LEFT TOTAL HIP ARTHROPLASTY ANTERIOR APPROACH (Left) as a surgical intervention .  The patient's history has been reviewed, patient examined, no change in status, stable for surgery.  I have reviewed the patient's chart and labs.  Questions were answered to the patient's satisfaction.     Kynsley Whitehouse, Cloyde ReamsBrian James

## 2016-02-03 NOTE — Op Note (Signed)
OPERATIVE REPORT  SURGEON: Samson FredericBrian Betty Brooks, MD   ASSISTANT: Patrick Jupiterarla BEthune, RNFA.  PREOPERATIVE DIAGNOSIS: Left hip arthritis.   POSTOPERATIVE DIAGNOSIS: Left hip arthritis.   PROCEDURE: Left total hip arthroplasty, anterior approach.   IMPLANTS: DePuy Tri Lock stem, size 3, hi offset. DePuy Pinnacle Cup, size 56 mm. DePuy Altrx liner, size 36 by 56 mm, +4 neutral. DePuy Biolox ceramic head ball, size 36 + 1.5 mm.  ANESTHESIA:  Spinal  ESTIMATED BLOOD LOSS: 350 mL.  ANTIBIOTICS: 2 g Ancef.  DRAINS: None.  COMPLICATIONS: None.   CONDITION: PACU - hemodynamically stable.Marland Kitchen.   BRIEF CLINICAL NOTE: Erik Jackson D Garabedian is a 82 y.o. male with a long-standing history of Left hip arthritis. He does have a history of previous right total hip arthroplasty, posterior approach with leg length discrepancy, left shorter than right. After failing conservative management, the patient was indicated for total hip arthroplasty. The risks, benefits, and alternatives to the procedure were explained, and the patient elected to proceed.  PROCEDURE IN DETAIL: Surgical site was marked by myself. Spinal anesthesia was obtained in the pre-op holding area. Once inside the operative room, a foley catheter was inserted. The patient was then positioned on the Hana table. All bony prominences were well padded. The hip was prepped and draped in the normal sterile surgical fashion. A time-out was called verifying side and site of surgery. The patient received IV antibiotics within 60 minutes of beginning the procedure.  The direct anterior approach to the hip was performed through the Hueter interval. Lateral femoral circumflex vessels were treated with the Auqumantys. The anterior capsule was exposed and an inverted T capsulotomy was made.The femoral neck cut was made to the level of the templated cut. A corkscrew was placed into the head and the head was removed. The femoral head was found to have eburnated  bone. The head was passed to the back table and was measured.  Acetabular exposure was achieved, and the pulvinar and labrum were excised. Sequental reaming of the acetabulum was then performed up to a size 57 mm reamer. A 58 mm cup was then opened and impacted into place at approximately 40 degrees of abduction and 20 degrees of anteversion. The final polyethylene liner was impacted into place and acetabular osteophytes were removed.   I then gained femoral exposure taking care to protect the abductors and greater trochanter. This was performed using standard external rotation, extension, and adduction. The capsule was peeled off the inner aspect of the greater trochanter, taking care to preserve the short external rotators. A cookie cutter was used to enter the femoral canal, and then the femoral canal finder was placed. Sequential broaching was performed up to a size 3. Calcar planer was used on the femoral neck remnant. I placed a hi offset neck and a trial head ball. The hip was reduced. Leg lengths and offset were checked fluoroscopically. The hip was dislocated and trial components were removed. The final implants were placed, and the hip was reduced.  Fluoroscopy was used to confirm component position and leg lengths. At 90 degrees of external rotation and full extension, the hip was stable to an anterior directed force.  The wound was copiously irrigated with a dilute betadine solution followed by normal saline. Marcaine solution was injected into the periarticular soft tissue. The wound was closed in layers using #1 Vicryl and V-Loc for the fascia, 2-0 Vicryl for the subcutaneous fat, 2-0 Monocryl for the deep dermal layer, 3-0 running Monocryl subcuticular stitch, and Dermabond  for the skin. Once the glue was fully dried, an Aquacell Ag dressing was applied. The patient was transported to the recovery room in stable condition. Sponge, needle, and instrument counts were correct at  the end of the case x2. The patient tolerated the procedure well and there were no known complications.

## 2016-02-04 LAB — CBC
HEMATOCRIT: 32.4 % — AB (ref 39.0–52.0)
HEMOGLOBIN: 10.7 g/dL — AB (ref 13.0–17.0)
MCH: 30.7 pg (ref 26.0–34.0)
MCHC: 33 g/dL (ref 30.0–36.0)
MCV: 93.1 fL (ref 78.0–100.0)
Platelets: 237 10*3/uL (ref 150–400)
RBC: 3.48 MIL/uL — ABNORMAL LOW (ref 4.22–5.81)
RDW: 14.2 % (ref 11.5–15.5)
WBC: 7.6 10*3/uL (ref 4.0–10.5)

## 2016-02-04 LAB — BASIC METABOLIC PANEL
Anion gap: 9 (ref 5–15)
BUN: 15 mg/dL (ref 6–20)
CHLORIDE: 103 mmol/L (ref 101–111)
CO2: 25 mmol/L (ref 22–32)
CREATININE: 0.84 mg/dL (ref 0.61–1.24)
Calcium: 8.3 mg/dL — ABNORMAL LOW (ref 8.9–10.3)
GFR calc non Af Amer: >60 mL/min (ref >60)
GLUCOSE: 105 mg/dL — AB (ref 65–99)
Potassium: 3.7 mmol/L (ref 3.5–5.1)
Sodium: 137 mmol/L (ref 135–145)

## 2016-02-04 NOTE — Evaluation (Addendum)
Occupational Therapy Evaluation Patient Details Name: Erik Jackson MRN: [id number] DOB: 05-Apr-1931 Today's Date: 02/04/2016    History of Present Illness Pt is an 82 y.o. male who presents s/p L THA direct anterior approach on 02/02/16. Pt is WBAT on the L side.  PMH includes HLD, HTN, burn, hypothyroidism, nocturia, urinary retention, diverticulosis of colon, chronic dermatitis and pt also has gout.   Clinical Impression   Pt s/p above. Pt getting assist with LB dressing, PTA. Feel pt will benefit from acute OT to increase independence prior to d/c. Recommending HHOT.     Follow Up Recommendations  Supervision/Assistance - 24 hour;Home health OT    Equipment Recommendations  None recommended by OT    Recommendations for Other Services       Precautions / Restrictions Precautions Precautions: Fall Precaution Comments: Direct anterior approach Restrictions Weight Bearing Restrictions: Yes LLE Weight Bearing: Weight bearing as tolerated      Mobility Bed Mobility Overal bed mobility: Needs Assistance Bed Mobility: Supine to Sit;Sit to Supine     Supine to sit: Supervision Sit to supine: Supervision      Transfers Overall transfer level: Needs assistance Equipment used: None Transfers: Sit to/from Stand Sit to Stand: Min guard           Balance     Unsteady with ambulation without RW.                                 ADL Overall ADL's : Needs assistance/impaired                     Lower Body Dressing: Min guard;Sit to/from stand   Toilet Transfer: Min guard;Ambulation (ambulated with and without RW; sit to stand from bed)           Functional mobility during ADLs: Min guard;Rolling walker (ambulated with and without RW) General ADL Comments: Educated on LB dressing technique. Educated on safety such as use of bag on walker and items on floor. Educated on one tub transfer technique and recommended homehealth practice this with  them.      Vision     Perception     Praxis      Pertinent Vitals/Pain Pain Assessment: 0-10 Pain Score:  (1-2) Pain Location: LLE Pain Descriptors / Indicators: Grimacing Pain Intervention(s): Monitored during session     Hand Dominance     Extremity/Trunk Assessment Upper Extremity Assessment Upper Extremity Assessment: Overall WFL for tasks assessed   Lower Extremity Assessment Lower Extremity Assessment: Defer to PT evaluation       Communication Communication Communication: HOH   Cognition Arousal/Alertness: Awake/alert Behavior During Therapy: WFL for tasks assessed/performed;Flat affect Overall Cognitive Status:  (unsure of baseline-decreased safety awareness)                     General Comments          Shoulder Instructions      Home Living Family/patient expects to be discharged to:: Private residence Living Arrangements: Spouse/significant other Available Help at Discharge: Family;Available 24 hours/day Type of Home: House Home Access: Stairs to enter [employer] of Steps: 3 Entrance Stairs-Rails: Right Home Layout: One level     Bathroom Shower/Tub: Chief Strategy Officer:  (elevated toilet)     Home Equipment: Emergency planning/management officer - 2 wheels          Prior Functioning/Environment Level of  Independence: Needs assistance    ADL's / Homemaking Assistance Needed: assist with LB dressing at times        OT Diagnosis: Acute pain   OT Problem List: Decreased strength;Pain;Decreased knowledge of use of DME or AE;Decreased knowledge of precautions;Decreased safety awareness;Impaired balance (sitting and/or standing)   OT Treatment/Interventions: Self-care/ADL training;DME and/or AE instruction;Therapeutic activities;Patient/family education;Balance training;Cognitive remediation/compensation    OT Goals(Current goals can be found in the care plan section) Acute Rehab OT Goals Patient Stated Goal: go  home OT Goal Formulation: With patient Time For Goal Achievement: 02/11/16 Potential to Achieve Goals: Good ADL Goals Pt Will Perform Lower Body Dressing: sit to/from stand;with set-up Pt Will Transfer to Toilet: with modified independence;ambulating (elevated toilet) Pt Will Perform Tub/Shower Transfer: Tub transfer;with supervision;ambulating;shower seat;rolling walker;with set-up  OT Frequency: Min 2X/week   Barriers to D/C:            Co-evaluation              End of Session Equipment Utilized During Treatment: Gait belt;Rolling walker Nurse Communication: Other (comment) (recommending HHOT)  Activity Tolerance: Patient tolerated treatment well Patient left: in bed;with bed alarm set;with family/visitor present   Time: [id number] OT Time Calculation (min): 20 min Charges:  OT General Charges $OT Visit: 1 Procedure OT Evaluation $OT Eval Moderate Complexity: 1 Procedure G-CodesEarlie Raveling:    Ludmilla Mcgillis L OTR/L [phone] 02/04/2016, 10:25 AM

## 2016-02-04 NOTE — Progress Notes (Signed)
Physical Therapy Treatment Patient Details Name: Erik Jackson MRN: [id number] DOB: 04/23/1931 Today's Date: 02/04/2016    History of Present Illness Pt is an 82 y/o male who presents s/p L THA direct anterior approach on 02/02/16. Pt is WBAT on the L side.     PT Comments    Pt making excellent progress with mobility. I have encouraged the patient to gradually increase activity daily to tolerance and to not overdue it. Stair training completed. Pt is hopeful for DC this AM. Recommend HHPT. Pt somewhat impulsive with movements.   Follow Up Recommendations  Home health PT;Supervision for mobility/OOB     Equipment Recommendations  None recommended by PT    Recommendations for Other Services       Precautions / Restrictions Precautions Precautions: Fall Precaution Comments: Direct anterior approach Restrictions Weight Bearing Restrictions: Yes LLE Weight Bearing: Weight bearing as tolerated    Mobility  Bed Mobility Overal bed mobility:  (NT, pt sitting EOB. Did not want to lay down at end of sessi)                Transfers Overall transfer level: Needs assistance Equipment used: None Transfers: Sit to/from Stand Sit to Stand: Supervision         General transfer comment: cues to have walker in front of him before standing. No difficulty with getting up and down  Ambulation/Gait Ambulation/Gait assistance: Supervision Ambulation Distance (Feet): 200 Feet Assistive device: Rolling walker (2 wheeled) Gait Pattern/deviations: Step-through pattern;Decreased stride length Gait velocity: Decreased Gait velocity interpretation: Below normal speed for age/gender General Gait Details: VC's for improved posture and increased stride length    Stairs Stairs: Yes Stairs assistance: Min guard Stair Management: One rail Right;Backwards;Step to pattern Number of Stairs: 3 General stair comments: Verbal cues for proper technique.   Wheelchair Mobility    Modified  Rankin (Stroke Patients Only)       Balance     Sitting balance-Leahy Scale: Good                              Cognition Arousal/Alertness: Awake/alert Behavior During Therapy: WFL for tasks assessed/performed;Impulsive Overall Cognitive Status: Within Functional Limits for tasks assessed                      Exercises Total Joint Exercises Ankle Circles/Pumps: AROM;Both;10 reps;Seated Long Arc Quad: AROM;Both;10 reps;Seated    General Comments General comments (skin integrity, edema, etc.): Wife present for end of session.       Pertinent Vitals/Pain Pain Assessment: 0-10 Pain Score: 3  Pain Location: Lower left quad Pain Descriptors / Indicators: Sore Pain Intervention(s): Premedicated before session;Monitored during session    Home Living                      Prior Function            PT Goals (current goals can now be found in the care plan section) Acute Rehab PT Goals Patient Stated Goal: Home tomorrow after afternoon PT session PT Goal Formulation: With patient/family Time For Goal Achievement: 02/10/16 Potential to Achieve Goals: Good Progress towards PT goals: Progressing toward goals    Frequency  7X/week    PT Plan Current plan remains appropriate    Co-evaluation             End of Session Equipment Utilized During Treatment: Gait belt Activity Tolerance: Patient  tolerated treatment well Patient left: with call bell/phone within reach;with family/visitor present;in bed;with bed alarm set     Time: 1610-9604 PT Time Calculation (min) (ACUTE ONLY): 27 min  Charges:  $Gait Training: 23-37 mins                    G Codes:      Donnella Sham Feb 20, 2016, 8:44 AM Lavona Mound, PT  [phone] 02-20-16

## 2016-02-04 NOTE — Care Management Note (Signed)
Case Management Note  Patient Details  Name: Erik Jackson MRN: [id number] Date of Birth: November 30, 1930  Subjective/Objective: 82 yo M s/p L THA direct anterior approach       Action/Plan: PT is recommending HHPT   Expected Discharge Date:    02/04/16            Expected Discharge Plan:  [place]  In-House Referral:     Discharge planning Services  CM Consult  Post Acute Care Choice:    Choice offered to:  Spouse  DME Arranged:    DME Agency:     HH Arranged:  PT [place]:  [facility]  Status of Service:  Completed, signed off  Medicare Important Message Given:    Date Medicare IM Given:    Medicare IM give by:    Date Additional Medicare IM Given:    Additional Medicare Important Message give by:     If discussed at [place] of Stay Meetings, dates discussed:    Additional Comments: met with pt and wife at bedside. D/C plan is to return home with the support of his wife. He has a walker, 3-in-1 BSC and a shower chair. Wife prefers to use [facility] for HHPT. She stated that she used them before and they were really good. Contacted Tiffany at [facility] for referral.  Norina Buzzard, RN 02/04/2016, 1:49 PM

## 2016-02-04 NOTE — Progress Notes (Signed)
PT Cancellation Note  Patient Details Name: Erik Jackson MRN: [id number] DOB: 01/23/1931   Cancelled Treatment:    Reason Eval/Treat Not Completed: Other (comment) (Pt dressed and ready for DC) Pt and wife report they have no further questions for PT and are ready to leave.    Donnella ShamSawulski, Mariesa Grieder J 02/04/2016, 3:16 PM Lavona MoundMark Adriana Quinby, PT  [phone] 02/04/2016

## 2016-02-04 NOTE — Progress Notes (Signed)
Patient ID: Erik Jackson, male   DOB: 11/02/1930, 82 y.o.   MRN: [id number]    Subjective: 1 Day Post-Op Procedure(s) (LRB): LEFT TOTAL HIP ARTHROPLASTY ANTERIOR APPROACH (Left) Patient reports pain as 3 on 0-10 scale.  Increased with PT Denies CP or SOB.  Pt has not voided since removal of the foley.  Pt reports BM before inpatient Objective: Vital signs in last 24 hours: Temp:  [97.2 F (36.2 C)-99.1 F (37.3 C)] 99.1 F (37.3 C) (05/27 0544) Pulse Rate:  [41-76] 76 (05/27 0544) Resp:  [12-18] 16 (05/27 0544) BP: (98-142)/(46-68) 110/46 mmHg (05/27 0544) SpO2:  [96 %-100 %] 96 % (05/27 0544)  Intake/Output from previous day: 05/26 0701 - 05/27 0700 In: 2700 [P.O.:1200; I.V.:1500] Out: 2200 [Urine:1750; Blood:450] Intake/Output this shift:    Labs:  Recent Labs  02/04/16 0518  HGB 10.7*    Recent Labs  02/04/16 0518  WBC 7.6  RBC 3.48*  HCT 32.4*  PLT 237    Recent Labs  02/04/16 0518  NA 137  K 3.7  CL 103  CO2 25  BUN 15  CREATININE 0.84  GLUCOSE 105*  CALCIUM 8.3*   No results for input(s): LABPT, INR in the last 72 hours.  Physical Exam: Neurologically intact ABD soft Sensation intact distally Dorsiflexion/Plantar flexion intact Incision: no drainage Compartment soft  Assessment/Plan: 1 Day Post-Op Procedure(s) (LRB): LEFT TOTAL HIP ARTHROPLASTY ANTERIOR APPROACH (Left) Advance diet Up with therapy  Consider DC later today or tomorrow Pt has not urinated since removal of the foley this am WBAT  [place], Erik Jackson for Dr. Venita Lickahari Brooks [facility] [phone] 02/04/2016, 9:47 AM

## 2016-02-06 ENCOUNTER — Encounter (HOSPITAL_COMMUNITY): Payer: Self-pay | Admitting: Orthopedic Surgery

## 2016-02-07 ENCOUNTER — Other Ambulatory Visit: Payer: Self-pay | Admitting: Internal Medicine

## 2016-02-07 DIAGNOSIS — Z87891 Personal history of nicotine dependence: Secondary | ICD-10-CM | POA: Diagnosis not present

## 2016-02-07 DIAGNOSIS — Z96642 Presence of left artificial hip joint: Secondary | ICD-10-CM | POA: Diagnosis not present

## 2016-02-07 DIAGNOSIS — Z7982 Long term (current) use of aspirin: Secondary | ICD-10-CM | POA: Diagnosis not present

## 2016-02-07 DIAGNOSIS — E785 Hyperlipidemia, unspecified: Secondary | ICD-10-CM | POA: Diagnosis not present

## 2016-02-07 DIAGNOSIS — E039 Hypothyroidism, unspecified: Secondary | ICD-10-CM | POA: Diagnosis not present

## 2016-02-07 DIAGNOSIS — I10 Essential (primary) hypertension: Secondary | ICD-10-CM | POA: Diagnosis not present

## 2016-02-07 DIAGNOSIS — Z471 Aftercare following joint replacement surgery: Secondary | ICD-10-CM | POA: Diagnosis not present

## 2016-02-09 DIAGNOSIS — E039 Hypothyroidism, unspecified: Secondary | ICD-10-CM | POA: Diagnosis not present

## 2016-02-09 DIAGNOSIS — E785 Hyperlipidemia, unspecified: Secondary | ICD-10-CM | POA: Diagnosis not present

## 2016-02-09 DIAGNOSIS — Z96642 Presence of left artificial hip joint: Secondary | ICD-10-CM | POA: Diagnosis not present

## 2016-02-09 DIAGNOSIS — Z471 Aftercare following joint replacement surgery: Secondary | ICD-10-CM | POA: Diagnosis not present

## 2016-02-09 DIAGNOSIS — I10 Essential (primary) hypertension: Secondary | ICD-10-CM | POA: Diagnosis not present

## 2016-02-09 DIAGNOSIS — Z87891 Personal history of nicotine dependence: Secondary | ICD-10-CM | POA: Diagnosis not present

## 2016-02-09 DIAGNOSIS — Z7982 Long term (current) use of aspirin: Secondary | ICD-10-CM | POA: Diagnosis not present

## 2016-02-13 DIAGNOSIS — Z7982 Long term (current) use of aspirin: Secondary | ICD-10-CM | POA: Diagnosis not present

## 2016-02-13 DIAGNOSIS — I10 Essential (primary) hypertension: Secondary | ICD-10-CM | POA: Diagnosis not present

## 2016-02-13 DIAGNOSIS — Z471 Aftercare following joint replacement surgery: Secondary | ICD-10-CM | POA: Diagnosis not present

## 2016-02-13 DIAGNOSIS — Z96642 Presence of left artificial hip joint: Secondary | ICD-10-CM | POA: Diagnosis not present

## 2016-02-13 DIAGNOSIS — Z87891 Personal history of nicotine dependence: Secondary | ICD-10-CM | POA: Diagnosis not present

## 2016-02-13 DIAGNOSIS — E785 Hyperlipidemia, unspecified: Secondary | ICD-10-CM | POA: Diagnosis not present

## 2016-02-13 DIAGNOSIS — E039 Hypothyroidism, unspecified: Secondary | ICD-10-CM | POA: Diagnosis not present

## 2016-02-15 DIAGNOSIS — Z96642 Presence of left artificial hip joint: Secondary | ICD-10-CM | POA: Diagnosis not present

## 2016-02-15 DIAGNOSIS — Z87891 Personal history of nicotine dependence: Secondary | ICD-10-CM | POA: Diagnosis not present

## 2016-02-15 DIAGNOSIS — E785 Hyperlipidemia, unspecified: Secondary | ICD-10-CM | POA: Diagnosis not present

## 2016-02-15 DIAGNOSIS — Z471 Aftercare following joint replacement surgery: Secondary | ICD-10-CM | POA: Diagnosis not present

## 2016-02-15 DIAGNOSIS — I10 Essential (primary) hypertension: Secondary | ICD-10-CM | POA: Diagnosis not present

## 2016-02-15 DIAGNOSIS — Z7982 Long term (current) use of aspirin: Secondary | ICD-10-CM | POA: Diagnosis not present

## 2016-02-15 DIAGNOSIS — E039 Hypothyroidism, unspecified: Secondary | ICD-10-CM | POA: Diagnosis not present

## 2016-02-20 DIAGNOSIS — Z96642 Presence of left artificial hip joint: Secondary | ICD-10-CM | POA: Diagnosis not present

## 2016-02-20 DIAGNOSIS — E039 Hypothyroidism, unspecified: Secondary | ICD-10-CM | POA: Diagnosis not present

## 2016-02-20 DIAGNOSIS — E785 Hyperlipidemia, unspecified: Secondary | ICD-10-CM | POA: Diagnosis not present

## 2016-02-20 DIAGNOSIS — Z471 Aftercare following joint replacement surgery: Secondary | ICD-10-CM | POA: Diagnosis not present

## 2016-02-20 DIAGNOSIS — Z87891 Personal history of nicotine dependence: Secondary | ICD-10-CM | POA: Diagnosis not present

## 2016-02-20 DIAGNOSIS — I10 Essential (primary) hypertension: Secondary | ICD-10-CM | POA: Diagnosis not present

## 2016-02-20 DIAGNOSIS — Z7982 Long term (current) use of aspirin: Secondary | ICD-10-CM | POA: Diagnosis not present

## 2016-02-21 ENCOUNTER — Other Ambulatory Visit: Payer: Self-pay | Admitting: Internal Medicine

## 2016-03-02 ENCOUNTER — Other Ambulatory Visit: Payer: Self-pay | Admitting: Internal Medicine

## 2016-03-14 DIAGNOSIS — Z96642 Presence of left artificial hip joint: Secondary | ICD-10-CM | POA: Diagnosis not present

## 2016-03-14 DIAGNOSIS — Z471 Aftercare following joint replacement surgery: Secondary | ICD-10-CM | POA: Diagnosis not present

## 2016-03-28 DIAGNOSIS — N401 Enlarged prostate with lower urinary tract symptoms: Secondary | ICD-10-CM | POA: Diagnosis not present

## 2016-04-11 DIAGNOSIS — R3912 Poor urinary stream: Secondary | ICD-10-CM | POA: Diagnosis not present

## 2016-04-11 DIAGNOSIS — N401 Enlarged prostate with lower urinary tract symptoms: Secondary | ICD-10-CM | POA: Diagnosis not present

## 2016-05-08 ENCOUNTER — Other Ambulatory Visit: Payer: Self-pay | Admitting: Internal Medicine

## 2016-07-14 ENCOUNTER — Emergency Department (HOSPITAL_COMMUNITY): Payer: Medicare Other

## 2016-07-14 ENCOUNTER — Encounter (HOSPITAL_COMMUNITY): Payer: Self-pay | Admitting: Emergency Medicine

## 2016-07-14 ENCOUNTER — Emergency Department (HOSPITAL_COMMUNITY)
Admission: EM | Admit: 2016-07-14 | Discharge: 2016-07-15 | Disposition: A | Discharge status: Home / Self Care | Payer: Medicare Other | Attending: Emergency Medicine | Admitting: Emergency Medicine

## 2016-07-14 DIAGNOSIS — Y939 Activity, unspecified: Secondary | ICD-10-CM | POA: Insufficient documentation

## 2016-07-14 DIAGNOSIS — S0181XA Laceration without foreign body of other part of head, initial encounter: Secondary | ICD-10-CM | POA: Diagnosis not present

## 2016-07-14 DIAGNOSIS — Z79899 Other long term (current) drug therapy: Secondary | ICD-10-CM | POA: Insufficient documentation

## 2016-07-14 DIAGNOSIS — W0110XA Fall on same level from slipping, tripping and stumbling with subsequent striking against unspecified object, initial encounter: Secondary | ICD-10-CM | POA: Insufficient documentation

## 2016-07-14 DIAGNOSIS — Z7982 Long term (current) use of aspirin: Secondary | ICD-10-CM | POA: Diagnosis not present

## 2016-07-14 DIAGNOSIS — S199XXA Unspecified injury of neck, initial encounter: Secondary | ICD-10-CM | POA: Diagnosis not present

## 2016-07-14 DIAGNOSIS — Y999 Unspecified external cause status: Secondary | ICD-10-CM | POA: Insufficient documentation

## 2016-07-14 DIAGNOSIS — Z96643 Presence of artificial hip joint, bilateral: Secondary | ICD-10-CM | POA: Diagnosis not present

## 2016-07-14 DIAGNOSIS — Z87891 Personal history of nicotine dependence: Secondary | ICD-10-CM | POA: Insufficient documentation

## 2016-07-14 DIAGNOSIS — I10 Essential (primary) hypertension: Secondary | ICD-10-CM | POA: Diagnosis not present

## 2016-07-14 DIAGNOSIS — S01112A Laceration without foreign body of left eyelid and periocular area, initial encounter: Secondary | ICD-10-CM | POA: Diagnosis not present

## 2016-07-14 DIAGNOSIS — Z23 Encounter for immunization: Secondary | ICD-10-CM | POA: Insufficient documentation

## 2016-07-14 DIAGNOSIS — J45909 Unspecified asthma, uncomplicated: Secondary | ICD-10-CM | POA: Insufficient documentation

## 2016-07-14 DIAGNOSIS — R930 Abnormal findings on diagnostic imaging of skull and head, not elsewhere classified: Secondary | ICD-10-CM | POA: Insufficient documentation

## 2016-07-14 DIAGNOSIS — Y92009 Unspecified place in unspecified non-institutional (private) residence as the place of occurrence of the external cause: Secondary | ICD-10-CM | POA: Diagnosis not present

## 2016-07-14 DIAGNOSIS — E039 Hypothyroidism, unspecified: Secondary | ICD-10-CM | POA: Diagnosis not present

## 2016-07-14 DIAGNOSIS — W19XXXA Unspecified fall, initial encounter: Secondary | ICD-10-CM

## 2016-07-14 DIAGNOSIS — S0990XA Unspecified injury of head, initial encounter: Secondary | ICD-10-CM | POA: Diagnosis not present

## 2016-07-14 MED ORDER — LIDOCAINE-EPINEPHRINE (PF) 2 %-1:200000 IJ SOLN
10.0000 mL | Freq: Once | INTRAMUSCULAR | Status: DC
  Filled 2016-07-14: qty 20

## 2016-07-14 MED ORDER — TETANUS-DIPHTH-ACELL PERTUSSIS 5-2.5-18.5 LF-MCG/0.5 IM SUSP
0.5000 mL | Freq: Once | INTRAMUSCULAR | Status: AC
  Administered 2016-07-14: 0.5 mL via INTRAMUSCULAR
  Filled 2016-07-14: qty 0.5

## 2016-07-14 MED ORDER — LIDOCAINE-EPINEPHRINE-TETRACAINE (LET) SOLUTION
3.0000 mL | Freq: Once | NASAL | Status: AC
  Administered 2016-07-14: 3 mL via TOPICAL
  Filled 2016-07-14: qty 3
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
## 2016-07-14 NOTE — ED Triage Notes (Signed)
Pt slipped in the shower and fell to the ground incurring no injuries. Pt states he then got up and slipped on water on the ground outside the shower and fell hitting his left eyebrow on the edge of the tub. Pt denies LOC, lightheadedness, dizziness, nausea, or emesis. Pt stated he feels a "mild burning" where he has the laceration. The area is bandaged and bleeding is controled. Pt denies blood thinner use

## 2016-07-15 MED ORDER — BACITRACIN ZINC 500 UNIT/GM EX OINT: 1.0000 "application " | TOPICAL_OINTMENT | Freq: Two times a day (BID) | CUTANEOUS | 0 refills | Status: DC

## 2016-07-18 ENCOUNTER — Encounter: Payer: Self-pay | Admitting: Internal Medicine

## 2016-07-18 ENCOUNTER — Ambulatory Visit (INDEPENDENT_AMBULATORY_CARE_PROVIDER_SITE_OTHER): Payer: Medicare Other | Admitting: Internal Medicine

## 2016-07-18 ENCOUNTER — Other Ambulatory Visit (INDEPENDENT_AMBULATORY_CARE_PROVIDER_SITE_OTHER): Payer: Medicare Other

## 2016-07-18 ENCOUNTER — Other Ambulatory Visit: Payer: Self-pay | Admitting: Internal Medicine

## 2016-07-18 VITALS — BP 138/72 | HR 105 | Temp 98.3°F | Resp 20 | Wt 171.2 lb

## 2016-07-18 DIAGNOSIS — E785 Hyperlipidemia, unspecified: Secondary | ICD-10-CM

## 2016-07-18 DIAGNOSIS — I10 Essential (primary) hypertension: Secondary | ICD-10-CM

## 2016-07-18 DIAGNOSIS — E039 Hypothyroidism, unspecified: Secondary | ICD-10-CM

## 2016-07-18 DIAGNOSIS — Z0001 Encounter for general adult medical examination with abnormal findings: Secondary | ICD-10-CM | POA: Diagnosis not present

## 2016-07-18 DIAGNOSIS — E079 Disorder of thyroid, unspecified: Secondary | ICD-10-CM | POA: Diagnosis not present

## 2016-07-18 DIAGNOSIS — J309 Allergic rhinitis, unspecified: Secondary | ICD-10-CM

## 2016-07-18 DIAGNOSIS — R7989 Other specified abnormal findings of blood chemistry: Secondary | ICD-10-CM | POA: Diagnosis not present

## 2016-07-18 DIAGNOSIS — Z23 Encounter for immunization: Secondary | ICD-10-CM | POA: Diagnosis not present

## 2016-07-18 LAB — BASIC METABOLIC PANEL
BUN: 18 mg/dL (ref 6–23)
CHLORIDE: 101 meq/L (ref 96–112)
CO2: 30 mEq/L (ref 19–32)
Calcium: 9.5 mg/dL (ref 8.4–10.5)
Creatinine, Ser: 0.85 mg/dL (ref 0.40–1.50)
GFR: 91.36 mL/min (ref >60.00)
Glucose, Bld: 88 mg/dL (ref 70–99)
POTASSIUM: 3.9 meq/L (ref 3.5–5.1)
SODIUM: 139 meq/L (ref 135–145)

## 2016-07-18 LAB — URINALYSIS, ROUTINE W REFLEX MICROSCOPIC
Bilirubin Urine: NEGATIVE
Hgb urine dipstick: NEGATIVE
KETONES UR: NEGATIVE
Leukocytes, UA: NEGATIVE
NITRITE: NEGATIVE
PH: 5.5 (ref 5.0–8.0)
RBC / HPF: NONE SEEN (ref 0–?)
SPECIFIC GRAVITY, URINE: 1.01 (ref 1.000–1.030)
Total Protein, Urine: NEGATIVE
Urine Glucose: NEGATIVE
Urobilinogen, UA: 0.2 (ref 0.0–1.0)
WBC UA: NONE SEEN (ref 0–?)

## 2016-07-18 LAB — TSH: TSH: 6.43 u[IU]/mL — ABNORMAL HIGH (ref 0.35–4.50)

## 2016-07-18 LAB — HEPATIC FUNCTION PANEL
ALT: 23 U/L (ref 0–53)
AST: 18 U/L (ref 0–37)
Albumin: 4.3 g/dL (ref 3.5–5.2)
Alkaline Phosphatase: 50 U/L (ref 39–117)
BILIRUBIN DIRECT: 0 mg/dL (ref 0.0–0.3)
BILIRUBIN TOTAL: 0.4 mg/dL (ref 0.2–1.2)
Total Protein: 7.5 g/dL (ref 6.0–8.3)

## 2016-07-18 LAB — LIPID PANEL
CHOL/HDL RATIO: 5
Cholesterol: 167 mg/dL (ref 0–200)
HDL: 34.4 mg/dL — ABNORMAL LOW (ref >39.00)
NONHDL: 132.41
Triglycerides: 267 mg/dL — ABNORMAL HIGH (ref 0.0–149.0)
VLDL: 53.4 mg/dL — ABNORMAL HIGH (ref 0.0–40.0)

## 2016-07-18 LAB — CBC WITH DIFFERENTIAL/PLATELET
BASOS PCT: 1 % (ref 0.0–3.0)
Basophils Absolute: 0.1 10*3/uL (ref 0.0–0.1)
EOS ABS: 0.3 10*3/uL (ref 0.0–0.7)
EOS PCT: 4.9 % (ref 0.0–5.0)
HEMATOCRIT: 37.9 % — AB (ref 39.0–52.0)
HEMOGLOBIN: 12.9 g/dL — AB (ref 13.0–17.0)
Lymphocytes Relative: 28.7 % (ref 12.0–46.0)
Lymphs Abs: 1.8 10*3/uL (ref 0.7–4.0)
MCHC: 33.9 g/dL (ref 30.0–36.0)
MCV: 91.2 fl (ref 78.0–100.0)
MONO ABS: 0.5 10*3/uL (ref 0.1–1.0)
Monocytes Relative: 8.6 % (ref 3.0–12.0)
NEUTROS ABS: 3.5 10*3/uL (ref 1.4–7.7)
Neutrophils Relative %: 56.8 % (ref 43.0–77.0)
PLATELETS: 355 10*3/uL (ref 150.0–400.0)
RBC: 4.16 Mil/uL — ABNORMAL LOW (ref 4.22–5.81)
RDW: 15.6 % — AB (ref 11.5–15.5)
WBC: 6.1 10*3/uL (ref 4.0–10.5)

## 2016-07-18 LAB — LDL CHOLESTEROL, DIRECT: LDL DIRECT: 111 mg/dL

## 2016-07-18 MED ORDER — LEVOTHYROXINE SODIUM 75 MCG PO TABS: 75.0000 ug | ORAL_TABLET | Freq: Every day | ORAL | 3 refills | Status: DC

## 2016-07-18 MED ORDER — ROSUVASTATIN CALCIUM 20 MG PO TABS: 20.0000 mg | ORAL_TABLET | Freq: Every day | ORAL | 3 refills | Status: DC

## 2016-07-18 NOTE — Progress Notes (Signed)
Pre visit review using our clinic review tool, if applicable. No additional management support is needed unless otherwise documented below in the visit note. 
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
## 2016-07-18 NOTE — Patient Instructions (Addendum)
Your stitches were removed today  OK to try the OTC Claritin for allergies  You had the flu shot today  Please continue all other medications as before, and refills have been done if requested.  Please have the pharmacy call with any other refills you may need.  Please continue your efforts at being more active, low cholesterol diet, and weight control.  You are otherwise up to date with prevention measures today.  Please keep your appointments with your specialists as you may have planned, including yearly eye doctor visits  Please go to the LAB in the Basement (turn left off the elevator) for the tests to be done today  You will be contacted by phone if any changes need to be made immediately.  Otherwise, you will receive a letter about your results with an explanation, but please check with MyChart first.  Please remember to sign up for MyChart if you have not done so, as this will be important to you in the future with finding out test results, communicating by private email, and scheduling acute appointments online when needed.  Please return in 6 months, or sooner if needed

## 2016-07-22 ENCOUNTER — Encounter: Payer: Self-pay | Admitting: Internal Medicine

## 2016-07-22 DIAGNOSIS — J309 Allergic rhinitis, unspecified: Secondary | ICD-10-CM

## 2016-07-22 HISTORY — DX: Allergic rhinitis, unspecified: J30.9

## 2016-07-22 NOTE — Assessment & Plan Note (Signed)
stable overall by history and exam, recent data reviewed with pt, and pt to continue medical treatment as before,  to f/u any worsening symptoms or concerns Lab Results  Component Value Date   TSH 6.43 (H) 07/18/2016

## 2016-07-22 NOTE — Assessment & Plan Note (Signed)
stable overall by history and exam, recent data reviewed with pt, and pt to continue medical treatment as before,  to f/u any worsening symptoms or concerns BP Readings from Last 3 Encounters:  07/18/16 138/72  07/14/16 122/90  02/04/16 (!) 114/50

## 2016-07-22 NOTE — Assessment & Plan Note (Signed)
stable overall by history and exam, recent data reviewed with pt, and pt to continue medical treatment as before,  to f/u any worsening symptoms or concerns Lab Results  Component Value Date   LDLCALC 102 (H) 09/16/2015

## 2016-07-22 NOTE — Assessment & Plan Note (Signed)

## 2016-07-22 NOTE — Assessment & Plan Note (Addendum)
Mild to mod, for claritin otc prn,  to f/u any worsening symptoms or concerns  In addition to the time spent performing CPE, I spent an additional 10 minutes face to face,in which greater than 50% of this time was spent in counseling and coordination of care for patient's illness as documented.

## 2016-08-07 ENCOUNTER — Other Ambulatory Visit: Payer: Self-pay | Admitting: Internal Medicine

## 2016-08-07 NOTE — Telephone Encounter (Signed)
Lisinopril and hct done erx

## 2016-10-04 MED FILL — ALFUZOSIN HCL ER 10 MG TAB: 10 | 30 days supply | Qty: 30 | Fill #0

## 2016-10-15 ENCOUNTER — Other Ambulatory Visit: Payer: Self-pay | Admitting: Internal Medicine

## 2016-10-15 ENCOUNTER — Other Ambulatory Visit (HOSPITAL_COMMUNITY): Payer: Self-pay | Admitting: Internal Medicine

## 2016-10-15 DIAGNOSIS — I6523 Occlusion and stenosis of bilateral carotid arteries: Secondary | ICD-10-CM

## 2016-10-15 MED FILL — ROSUVASTATIN CALCIUM 20 MG: 20 | 90 days supply | Qty: 90 | Fill #0

## 2016-10-22 ENCOUNTER — Ambulatory Visit (HOSPITAL_COMMUNITY)
Admission: RE | Admit: 2016-10-22 | Discharge: 2016-10-22 | Disposition: A | Discharge status: Home / Self Care | Payer: Medicare Other | Source: Ambulatory Visit | Attending: Cardiovascular Disease | Admitting: Cardiovascular Disease

## 2016-10-22 DIAGNOSIS — E785 Hyperlipidemia, unspecified: Secondary | ICD-10-CM | POA: Diagnosis not present

## 2016-10-22 DIAGNOSIS — Z87891 Personal history of nicotine dependence: Secondary | ICD-10-CM | POA: Insufficient documentation

## 2016-10-22 DIAGNOSIS — I6523 Occlusion and stenosis of bilateral carotid arteries: Secondary | ICD-10-CM | POA: Diagnosis not present

## 2016-10-22 DIAGNOSIS — I10 Essential (primary) hypertension: Secondary | ICD-10-CM | POA: Insufficient documentation

## 2016-10-24 DIAGNOSIS — N401 Enlarged prostate with lower urinary tract symptoms: Secondary | ICD-10-CM | POA: Diagnosis not present

## 2016-10-27 ENCOUNTER — Encounter: Payer: Self-pay | Admitting: Internal Medicine

## 2016-10-29 ENCOUNTER — Ambulatory Visit (INDEPENDENT_AMBULATORY_CARE_PROVIDER_SITE_OTHER): Payer: Medicare Other | Admitting: Family Medicine

## 2016-10-29 ENCOUNTER — Encounter: Payer: Self-pay | Admitting: Family Medicine

## 2016-10-29 VITALS — BP 148/60 | HR 81 | Temp 98.7°F | Resp 12 | Ht 69.0 in | Wt 168.2 lb

## 2016-10-29 DIAGNOSIS — J988 Other specified respiratory disorders: Secondary | ICD-10-CM

## 2016-10-29 DIAGNOSIS — I10 Essential (primary) hypertension: Secondary | ICD-10-CM | POA: Diagnosis not present

## 2016-10-29 DIAGNOSIS — J309 Allergic rhinitis, unspecified: Secondary | ICD-10-CM | POA: Diagnosis not present

## 2016-10-29 MED ORDER — FLUTICASONE PROPIONATE 50 MCG/ACT NA SUSP: 1.0000 | Freq: Two times a day (BID) | NASAL | 0 refills | Status: DC

## 2016-10-29 MED ORDER — BENZONATATE 100 MG PO CAPS: 200.0000 mg | ORAL_CAPSULE | Freq: Two times a day (BID) | ORAL | 0 refills | Status: AC | PRN

## 2016-10-29 MED ORDER — DOXYCYCLINE HYCLATE 100 MG PO TABS
100.0000 mg | ORAL_TABLET | Freq: Two times a day (BID) | ORAL | 0 refills | Status: AC
Start: 2016-10-29 — End: 2016-11-05

## 2016-10-29 MED FILL — DOXYCYCLINE HYCLATE 100 MG: 100 | 7 days supply | Qty: 14 | Fill #0

## 2016-10-29 MED FILL — BENZONATATE 100 MG CAP: 100 | 11 days supply | Qty: 45 | Fill #0

## 2016-10-29 NOTE — Progress Notes (Signed)
Pre visit review using our clinic review tool, if applicable. No additional management support is needed unless otherwise documented below in the visit note. 
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
## 2016-10-29 NOTE — Patient Instructions (Addendum)
  Erik Jackson I have seen you today for an acute visit.  A few things to remember from today's visit:   Respiratory tract infection - Plan: doxycycline (VIBRA-TABS) 100 MG tablet, DG Chest 2 View, benzonatate (TESSALON) 100 MG capsule  Essential hypertension  Allergic rhinitis, unspecified chronicity, unspecified seasonality, unspecified trigger - Plan: fluticasone (FLONASE) 50 MCG/ACT nasal spray  Antibiotic started because ? Pneumonia right lung.   Over the counter medications as decongestants and cold medications could cause high blood pressure or palpitations.SO AVOID.  Tylenol also helps with most symptoms. Plenty of fluids. Honey helps with cough. Steam inhalations helps with runny nose, nasal congestion, and may prevent sinus infections. Cough and nasal congestion could last a few days and sometimes weeks. Please follow in not any better in 1-2 weeks or if symptoms get worse.   Today X ray was ordered.  [facility] at [facility] between 8 am and 5 pm: [address] [phone].       Medications prescribed today are intended for short period of time and will not be refill upon request, a follow up appointment might be necessary to discuss continuation of of treatment if appropriate.     In general please monitor for signs of worsening symptoms and seek immediate medical attention if any concerning.  If symptoms are not resolved in a few days/weeks you should schedule a follow up appointment with your doctor, before if needed.  Please be sure you have an appointment already scheduled with your PCP before you leave today.

## 2016-11-15 MED FILL — HYDROCHLOROTHIAZIDE 12.5 MG: 12.5 | 90 days supply | Qty: 90 | Fill #0

## 2016-11-15 MED FILL — LISINOPRIL 20 MG TABLET: 20 | 90 days supply | Qty: 90 | Fill #0

## 2016-11-30 MED FILL — ALFUZOSIN HCL ER 10 MG TAB: 10 | 90 days supply | Qty: 90 | Fill #1

## 2016-12-18 MED FILL — LEVOTHYROXINE 75 MCG TABLET: 75 | 30 days supply | Qty: 30 | Fill #0

## 2016-12-19 ENCOUNTER — Encounter: Payer: Self-pay | Admitting: Internal Medicine

## 2016-12-19 ENCOUNTER — Ambulatory Visit (INDEPENDENT_AMBULATORY_CARE_PROVIDER_SITE_OTHER): Payer: Medicare Other | Admitting: Internal Medicine

## 2016-12-19 VITALS — BP 142/64 | HR 79 | Temp 98.6°F | Ht 69.0 in | Wt 176.0 lb

## 2016-12-19 DIAGNOSIS — R42 Dizziness and giddiness: Secondary | ICD-10-CM | POA: Diagnosis not present

## 2016-12-19 DIAGNOSIS — J309 Allergic rhinitis, unspecified: Secondary | ICD-10-CM | POA: Diagnosis not present

## 2016-12-19 DIAGNOSIS — I10 Essential (primary) hypertension: Secondary | ICD-10-CM | POA: Diagnosis not present

## 2016-12-19 MED ORDER — MECLIZINE HCL 12.5 MG PO TABS: 12.5000 mg | ORAL_TABLET | Freq: Three times a day (TID) | ORAL | 2 refills | Status: AC | PRN

## 2016-12-19 MED ORDER — TRIAMCINOLONE ACETONIDE 55 MCG/ACT NA AERO: 2.0000 | INHALATION_SPRAY | Freq: Every day | NASAL | 12 refills | Status: DC

## 2016-12-19 MED ORDER — METHYLPREDNISOLONE ACETATE 80 MG/ML IJ SUSP
80.0000 mg | Freq: Once | INTRAMUSCULAR | Status: AC
  Administered 2016-12-19: 80 mg via INTRAMUSCULAR

## 2016-12-19 NOTE — Progress Notes (Signed)
Pre visit review using our clinic review tool, if applicable. No additional management support is needed unless otherwise documented below in the visit note. 

## 2016-12-19 NOTE — Patient Instructions (Signed)
.  You had the steroid shot today  Please take all new medication as prescribed - the Nasacort nasal spray  Please continue all other medications as before, and refills have been done if requested - the meclizine  Please have the pharmacy call with any other refills you may need.  Please keep your appointments with your specialists as you may have planned

## 2016-12-20 MED FILL — MECLIZINE 25 MG TABLET: 25 | 10 days supply | Qty: 15 | Fill #0

## 2016-12-22 NOTE — Assessment & Plan Note (Signed)
Mild to mod, c/w seasonal flare, for depomedrol IM 80, to f/u any worsening symptoms or concerns 

## 2016-12-22 NOTE — Assessment & Plan Note (Signed)
Mild, for meclizine prn,  to f/u any worsening symptoms or concerns 
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
## 2016-12-22 NOTE — Assessment & Plan Note (Signed)
stable overall by history and exam, recent data reviewed with pt, and pt to continue medical treatment as before,  to f/u any worsening symptoms or concerns BP Readings from Last 3 Encounters:  12/19/16 (!) 142/64  10/29/16 (!) 148/60  07/18/16 138/72

## 2017-01-02 MED FILL — ROSUVASTATIN CALCIUM 20 MG: 20 | 90 days supply | Qty: 90 | Fill #1

## 2017-01-02 MED FILL — LEVOTHYROXINE 75 MCG TABLET: 75 | 90 days supply | Qty: 90 | Fill #1

## 2017-02-06 ENCOUNTER — Telehealth: Payer: Self-pay | Admitting: Internal Medicine

## 2017-02-06 NOTE — Telephone Encounter (Signed)
Spoke wit patient's wife regarding awv. Mrs. Erik Jackson stated that he will think about scheduling awv and he will call the office to schedule appt.

## 2017-02-12 ENCOUNTER — Ambulatory Visit (INDEPENDENT_AMBULATORY_CARE_PROVIDER_SITE_OTHER): Payer: Medicare Other | Admitting: Internal Medicine

## 2017-02-12 ENCOUNTER — Encounter: Payer: Self-pay | Admitting: Internal Medicine

## 2017-02-12 ENCOUNTER — Other Ambulatory Visit (INDEPENDENT_AMBULATORY_CARE_PROVIDER_SITE_OTHER): Payer: Medicare Other

## 2017-02-12 VITALS — BP 126/72 | HR 60 | Ht 69.0 in | Wt 168.0 lb

## 2017-02-12 DIAGNOSIS — I10 Essential (primary) hypertension: Secondary | ICD-10-CM

## 2017-02-12 DIAGNOSIS — Z0000 Encounter for general adult medical examination without abnormal findings: Secondary | ICD-10-CM | POA: Diagnosis not present

## 2017-02-12 DIAGNOSIS — R351 Nocturia: Secondary | ICD-10-CM | POA: Diagnosis not present

## 2017-02-12 DIAGNOSIS — E039 Hypothyroidism, unspecified: Secondary | ICD-10-CM | POA: Diagnosis not present

## 2017-02-12 DIAGNOSIS — E785 Hyperlipidemia, unspecified: Secondary | ICD-10-CM

## 2017-02-12 LAB — HEPATIC FUNCTION PANEL
ALT: 29 U/L (ref 0–53)
AST: 22 U/L (ref 0–37)
Albumin: 4.4 g/dL (ref 3.5–5.2)
Alkaline Phosphatase: 36 U/L — ABNORMAL LOW (ref 39–117)
Bilirubin, Direct: 0.1 mg/dL (ref 0.0–0.3)
Total Bilirubin: 0.5 mg/dL (ref 0.2–1.2)
Total Protein: 7.4 g/dL (ref 6.0–8.3)

## 2017-02-12 LAB — LIPID PANEL
Cholesterol: 110 mg/dL (ref 0–200)
HDL: 40.9 mg/dL (ref >39.00)
LDL Cholesterol: 38 mg/dL (ref 0–99)
NonHDL: 69.12
Total CHOL/HDL Ratio: 3
Triglycerides: 157 mg/dL — ABNORMAL HIGH (ref 0.0–149.0)
VLDL: 31.4 mg/dL (ref 0.0–40.0)

## 2017-02-12 LAB — T4, FREE: Free T4: 1.07 ng/dL (ref 0.60–1.60)

## 2017-02-12 LAB — TSH: TSH: 0.19 u[IU]/mL — AB (ref 0.35–4.50)

## 2017-02-12 NOTE — Assessment & Plan Note (Signed)
Urged pt to f/u with urology by re-scheduling

## 2017-02-12 NOTE — Assessment & Plan Note (Signed)
stable overall by history and exam, recent data reviewed with pt, and pt to continue medical treatment as before,  to f/u any worsening symptoms or concerns BP Readings from Last 3 Encounters:  02/12/17 126/72  12/19/16 (!) 142/64  10/29/16 (!) 148/60

## 2017-02-12 NOTE — Assessment & Plan Note (Signed)
stable overall by history and exam, recent data reviewed with pt, and pt to continue medical treatment as before,  to f/u any worsening symptoms or concerns Lab Results  Component Value Date   LDLCALC 102 (H) 09/16/2015  For f/u lab today after change to crestor last visit

## 2017-02-12 NOTE — Assessment & Plan Note (Signed)
stable overall by history and exam, recent data reviewed with pt, and pt to continue medical treatment as before,  to f/u any worsening symptoms or concerns Lab Results  Component Value Date   TSH 6.43 (H) 07/18/2016  now on increased synthroid to 75, for f/u lab today

## 2017-02-12 NOTE — Patient Instructions (Signed)
Please continue all other medications as before, and refills have been done if requested.  Please have the pharmacy call with any other refills you may need.  Please continue your efforts at being more active, low cholesterol diet, and weight control.  You are otherwise up to date with prevention measures today.  Please keep your appointments with your specialists as you may have planned - please reschedule with Urology  Please go to the LAB in the Basement (turn left off the elevator) for the tests to be done today  You will be contacted by phone if any changes need to be made immediately.  Otherwise, you will receive a letter about your results with an explanation, but please check with MyChart first.  Please remember to sign up for MyChart if you have not done so, as this will be important to you in the future with finding out test results, communicating by private email, and scheduling acute appointments online when needed.  Please return in 6 months, or sooner if needed, with Lab testing done 3-5 days before
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
## 2017-02-13 ENCOUNTER — Other Ambulatory Visit: Payer: Self-pay | Admitting: Internal Medicine

## 2017-02-13 ENCOUNTER — Encounter: Payer: Self-pay | Admitting: Internal Medicine

## 2017-02-13 DIAGNOSIS — E039 Hypothyroidism, unspecified: Secondary | ICD-10-CM

## 2017-02-14 ENCOUNTER — Telehealth: Payer: Self-pay

## 2017-02-14 NOTE — Telephone Encounter (Signed)
Spoke with pt's wife and informed her of results and she expressed understanding.   Please add orders.

## 2017-02-14 NOTE — Telephone Encounter (Signed)
-----   Message from Corwin LevinsJames W John, MD sent at 02/13/2017  7:58 PM EDT ----- Left message on MyChart, pt to cont same tx except  The test results show that your current treatment is OK, except the TSH thyroid test was slightly abnormal.  This change is possibly due to the medication change earlier this year.  It is close to being ok, however, so I would ask you to return to the lab in 4 weeks for repeat testing.  If the test is still abnormal, we may need to adjust your medication again.  Shirron to please inform pt, I will do orders

## 2017-02-21 MED FILL — HYDROCHLOROTHIAZIDE 12.5 MG: 12.5 | 90 days supply | Qty: 90 | Fill #1

## 2017-02-21 MED FILL — LISINOPRIL 20 MG TAB: 20 | 90 days supply | Qty: 90 | Fill #1

## 2017-02-21 MED FILL — ALFUZOSIN HCL ER 10 MG TAB: 10 | 60 days supply | Qty: 60 | Fill #2

## 2017-03-07 MED FILL — TOLTERODINE TARTRATE 2 MG T: 2 | 30 days supply | Qty: 30 | Fill #0

## 2017-04-16 MED FILL — ROSUVASTATIN CALCIUM 20 MG: 20 | 90 days supply | Qty: 90 | Fill #2

## 2017-04-16 MED FILL — LEVOTHYROXINE 75 MCG TABLET: 75 | 90 days supply | Qty: 90 | Fill #2

## 2017-04-26 ENCOUNTER — Encounter: Payer: Self-pay | Admitting: Internal Medicine

## 2017-04-26 ENCOUNTER — Ambulatory Visit (INDEPENDENT_AMBULATORY_CARE_PROVIDER_SITE_OTHER): Payer: Medicare Other | Admitting: Internal Medicine

## 2017-04-26 VITALS — BP 116/66 | HR 74 | Temp 99.1°F | Ht 69.0 in | Wt 166.0 lb

## 2017-04-26 DIAGNOSIS — J309 Allergic rhinitis, unspecified: Secondary | ICD-10-CM | POA: Diagnosis not present

## 2017-04-26 DIAGNOSIS — I10 Essential (primary) hypertension: Secondary | ICD-10-CM

## 2017-04-26 DIAGNOSIS — J029 Acute pharyngitis, unspecified: Secondary | ICD-10-CM | POA: Insufficient documentation

## 2017-04-26 MED ORDER — AZITHROMYCIN 250 MG PO TABS: ORAL_TABLET | ORAL | 1 refills | Status: DC

## 2017-04-26 MED FILL — AZITHROMYCIN 250 MG TAB: 250 | 5 days supply | Qty: 6 | Fill #0

## 2017-04-26 NOTE — Patient Instructions (Signed)
Please take all new medication as prescribed - the antibiotic  Please continue all other medications as before, and refills have been done if requested.  Please have the pharmacy call with any other refills you may need.  Please keep your appointments with your specialists as you may have planned   
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
## 2017-04-28 NOTE — Assessment & Plan Note (Signed)
Mild to mod, for restart nasacort asd,  to f/u any worsening symptoms or concerns 

## 2017-04-28 NOTE — Assessment & Plan Note (Signed)
Mild to mod, for antibx course,  to f/u any worsening symptoms or concerns 

## 2017-04-28 NOTE — Assessment & Plan Note (Signed)
stable overall by history and exam, recent data reviewed with pt, and pt to continue medical treatment as before,  to f/u any worsening symptoms or concerns BP Readings from Last 3 Encounters:  04/26/17 116/66  02/12/17 126/72  12/19/16 (!) 142/64   ]

## 2017-04-30 ENCOUNTER — Telehealth: Payer: Self-pay | Admitting: Internal Medicine

## 2017-04-30 ENCOUNTER — Ambulatory Visit (INDEPENDENT_AMBULATORY_CARE_PROVIDER_SITE_OTHER): Payer: Medicare Other | Admitting: Internal Medicine

## 2017-04-30 ENCOUNTER — Encounter: Payer: Self-pay | Admitting: Internal Medicine

## 2017-04-30 VITALS — BP 116/68 | HR 64 | Temp 98.6°F | Ht 69.0 in | Wt 164.0 lb

## 2017-04-30 DIAGNOSIS — B370 Candidal stomatitis: Secondary | ICD-10-CM | POA: Insufficient documentation

## 2017-04-30 DIAGNOSIS — J029 Acute pharyngitis, unspecified: Secondary | ICD-10-CM | POA: Diagnosis not present

## 2017-04-30 DIAGNOSIS — I10 Essential (primary) hypertension: Secondary | ICD-10-CM

## 2017-04-30 MED ORDER — PREDNISONE 10 MG PO TABS: ORAL_TABLET | ORAL | 0 refills | Status: DC

## 2017-04-30 MED ORDER — NYSTATIN 100000 UNIT/ML MT SUSP: 500000.0000 [IU] | Freq: Four times a day (QID) | OROMUCOSAL | 1 refills | Status: DC

## 2017-04-30 MED ORDER — NYSTATIN 100000 UNIT/ML MT SUSP: 500000.0000 [IU] | Freq: Four times a day (QID) | OROMUCOSAL | 0 refills | Status: AC

## 2017-04-30 MED ORDER — LEVOFLOXACIN 250 MG PO TABS: 250.0000 mg | ORAL_TABLET | Freq: Every day | ORAL | 0 refills | Status: AC

## 2017-04-30 MED FILL — predniSONE 10 MG TABS: 10 | 9 days supply | Qty: 18 | Fill #0

## 2017-04-30 MED FILL — NYSTATIN 100,000 UNITS/ML S: 100000 | 10 days supply | Qty: 200 | Fill #0

## 2017-04-30 MED FILL — levoFLOXacin 500 MG TABS: 500 | 10 days supply | Qty: 5 | Fill #0

## 2017-04-30 NOTE — Assessment & Plan Note (Signed)
BP Readings from Last 3 Encounters:  04/30/17 116/68  04/26/17 116/66  02/12/17 126/72  stable overall by history and exam, recent data reviewed with pt, and pt to continue medical treatment as before,  to f/u any worsening symptoms or concerns
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
## 2017-04-30 NOTE — Assessment & Plan Note (Signed)
Worsening, now with hoarseness and scant prod cough, declinex cxr, for levaquin and predpac asd,  to f/u any worsening symptoms or concerns

## 2017-04-30 NOTE — Patient Instructions (Signed)
OK to stop the azithromycin antibiotic  Please take all new medication as prescribed - the levaquin and the prednisone, as well as the solution to treat the thrush  Please continue all other medications as before, and refills have been done if requested.  Please have the pharmacy call with any other refills you may need.  Please keep your appointments with your specialists as you may have planned

## 2017-04-30 NOTE — Assessment & Plan Note (Signed)
Mild to mod, for antibx course,  to f/u any worsening symptoms or concerns 

## 2017-05-27 MED FILL — HYDROCHLOROTHIAZIDE 12.5 MG: 12.5 | 90 days supply | Qty: 90 | Fill #2

## 2017-05-27 MED FILL — LISINOPRIL 20 MG TAB: 20 | 90 days supply | Qty: 90 | Fill #2

## 2017-06-07 DIAGNOSIS — N401 Enlarged prostate with lower urinary tract symptoms: Secondary | ICD-10-CM | POA: Diagnosis not present

## 2017-06-07 MED FILL — ALFUZOSIN HCL ER 10 MG TAB: 10 | 30 days supply | Qty: 30 | Fill #0

## 2017-07-22 ENCOUNTER — Other Ambulatory Visit: Payer: Self-pay | Admitting: Internal Medicine

## 2017-07-22 MED FILL — LEVOTHYROXINE 75 MCG TABLET: 75 | 90 days supply | Qty: 90 | Fill #0

## 2017-07-22 MED FILL — ROSUVASTATIN CALCIUM 20 MG: 20 | 90 days supply | Qty: 90 | Fill #0

## 2017-08-13 NOTE — Telephone Encounter (Signed)
error 

## 2017-08-20 ENCOUNTER — Ambulatory Visit: Payer: Medicare Other | Admitting: Internal Medicine

## 2017-08-28 ENCOUNTER — Other Ambulatory Visit: Payer: Self-pay | Admitting: Internal Medicine

## 2017-08-28 MED FILL — LISINOPRIL 20 MG TABLET: 20 | 90 days supply | Qty: 90 | Fill #0

## 2017-08-28 MED FILL — HYDROCHLOROTHIAZIDE 12.5 MG: 12.5 | 90 days supply | Qty: 90 | Fill #0

## 2017-09-16 ENCOUNTER — Ambulatory Visit: Payer: Medicare Other

## 2017-09-16 ENCOUNTER — Encounter: Payer: Self-pay | Admitting: Internal Medicine

## 2017-09-16 ENCOUNTER — Other Ambulatory Visit (INDEPENDENT_AMBULATORY_CARE_PROVIDER_SITE_OTHER): Payer: Medicare Other

## 2017-09-16 ENCOUNTER — Ambulatory Visit (INDEPENDENT_AMBULATORY_CARE_PROVIDER_SITE_OTHER): Payer: Medicare Other | Admitting: Internal Medicine

## 2017-09-16 VITALS — BP 116/72 | HR 68 | Temp 97.7°F | Ht 69.0 in | Wt 171.0 lb

## 2017-09-16 DIAGNOSIS — E039 Hypothyroidism, unspecified: Secondary | ICD-10-CM | POA: Diagnosis not present

## 2017-09-16 DIAGNOSIS — Z23 Encounter for immunization: Secondary | ICD-10-CM | POA: Diagnosis not present

## 2017-09-16 DIAGNOSIS — R2681 Unsteadiness on feet: Secondary | ICD-10-CM

## 2017-09-16 DIAGNOSIS — Z0000 Encounter for general adult medical examination without abnormal findings: Secondary | ICD-10-CM | POA: Diagnosis not present

## 2017-09-16 LAB — CBC WITH DIFFERENTIAL/PLATELET
BASOS ABS: 0.1 10*3/uL (ref 0.0–0.1)
Basophils Relative: 1.2 % (ref 0.0–3.0)
Eosinophils Absolute: 0.4 10*3/uL (ref 0.0–0.7)
Eosinophils Relative: 5.2 % — ABNORMAL HIGH (ref 0.0–5.0)
HEMATOCRIT: 42.4 % (ref 39.0–52.0)
Hemoglobin: 14 g/dL (ref 13.0–17.0)
LYMPHS PCT: 28.2 % (ref 12.0–46.0)
Lymphs Abs: 2.3 10*3/uL (ref 0.7–4.0)
MCHC: 32.9 g/dL (ref 30.0–36.0)
MCV: 97.1 fl (ref 78.0–100.0)
MONOS PCT: 8.3 % (ref 3.0–12.0)
Monocytes Absolute: 0.7 10*3/uL (ref 0.1–1.0)
Neutro Abs: 4.6 10*3/uL (ref 1.4–7.7)
Neutrophils Relative %: 57.1 % (ref 43.0–77.0)
Platelets: 288 10*3/uL (ref 150.0–400.0)
RBC: 4.37 Mil/uL (ref 4.22–5.81)
RDW: 14.2 % (ref 11.5–15.5)
WBC: 8.1 10*3/uL (ref 4.0–10.5)

## 2017-09-16 LAB — URINALYSIS, ROUTINE W REFLEX MICROSCOPIC
Bilirubin Urine: NEGATIVE
HGB URINE DIPSTICK: NEGATIVE
Ketones, ur: NEGATIVE
Leukocytes, UA: NEGATIVE
NITRITE: NEGATIVE
Specific Gravity, Urine: 1.025 (ref 1.000–1.030)
TOTAL PROTEIN, URINE-UPE24: NEGATIVE
Urine Glucose: NEGATIVE
Urobilinogen, UA: 0.2 (ref 0.0–1.0)
pH: 6 (ref 5.0–8.0)

## 2017-09-16 LAB — HEPATIC FUNCTION PANEL
ALBUMIN: 4.5 g/dL (ref 3.5–5.2)
ALK PHOS: 38 U/L — AB (ref 39–117)
ALT: 25 U/L (ref 0–53)
AST: 21 U/L (ref 0–37)
Bilirubin, Direct: 0.1 mg/dL (ref 0.0–0.3)
TOTAL PROTEIN: 7.3 g/dL (ref 6.0–8.3)
Total Bilirubin: 0.4 mg/dL (ref 0.2–1.2)

## 2017-09-16 LAB — BASIC METABOLIC PANEL
BUN: 24 mg/dL — ABNORMAL HIGH (ref 6–23)
CALCIUM: 9.8 mg/dL (ref 8.4–10.5)
CO2: 30 mEq/L (ref 19–32)
CREATININE: 0.93 mg/dL (ref 0.40–1.50)
Chloride: 101 mEq/L (ref 96–112)
GFR: 82.12 mL/min (ref >60.00)
Glucose, Bld: 97 mg/dL (ref 70–99)
Potassium: 4.3 mEq/L (ref 3.5–5.1)
Sodium: 139 mEq/L (ref 135–145)

## 2017-09-16 LAB — LDL CHOLESTEROL, DIRECT: Direct LDL: 74 mg/dL

## 2017-09-16 LAB — LIPID PANEL
CHOLESTEROL: 135 mg/dL (ref 0–200)
HDL: 39.6 mg/dL (ref >39.00)
NONHDL: 95.71
TRIGLYCERIDES: 223 mg/dL — AB (ref 0.0–149.0)
Total CHOL/HDL Ratio: 3
VLDL: 44.6 mg/dL — ABNORMAL HIGH (ref 0.0–40.0)

## 2017-09-16 LAB — TSH: TSH: 2.56 u[IU]/mL (ref 0.35–4.50)

## 2017-09-16 LAB — VITAMIN B12: VITAMIN B 12: 161 pg/mL — AB (ref 211–911)

## 2017-09-16 NOTE — Assessment & Plan Note (Signed)

## 2017-09-16 NOTE — Assessment & Plan Note (Addendum)
Asympt, for f/u thyroid tests, to f/u any worsening symptoms or concerns
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
## 2017-09-16 NOTE — Patient Instructions (Signed)

## 2017-09-16 NOTE — Assessment & Plan Note (Signed)
Mild, no tremor, declines need for cane or PT, for b12 with labs
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
## 2017-09-17 ENCOUNTER — Telehealth: Payer: Self-pay

## 2017-09-17 NOTE — Telephone Encounter (Signed)
-----   Message from Corwin LevinsJames W John, MD sent at 09/16/2017  7:27 PM EST ----- Left message on MyChart, pt to cont same tx except  The test results show that your current treatment is OK, except for Very Low Vitamin B12.  This can be related to what you mentioned at your visit with the mild unsteadiness and "feebleness" of your walking.  Please start with monthly B12 shots by coming to the office for these shots.  Eventually we may be able to change to taking B12 pills OTC.  I will ask the office to contact you about this.   Shirron to please inform pt, and start monthly B12 1000 mg shots with Nurse Visits

## 2017-09-17 NOTE — Telephone Encounter (Signed)
Pt's wife has been informed and expressed understanding. Will tell patient and has been transferred to reception to get B12 injections schedule with the nurse.

## 2017-10-07 ENCOUNTER — Ambulatory Visit (INDEPENDENT_AMBULATORY_CARE_PROVIDER_SITE_OTHER): Payer: Medicare Other | Admitting: *Deleted

## 2017-10-07 DIAGNOSIS — E538 Deficiency of other specified B group vitamins: Secondary | ICD-10-CM

## 2017-10-07 MED ORDER — CYANOCOBALAMIN 1000 MCG/ML IJ SOLN
1000.0000 ug | Freq: Once | INTRAMUSCULAR | Status: AC
  Administered 2017-10-07: 1000 ug via INTRAMUSCULAR

## 2017-10-28 MED FILL — ROSUVASTATIN CALCIUM 20 MG: 20 | 90 days supply | Qty: 90 | Fill #1

## 2017-10-28 MED FILL — LEVOTHYROXINE 75 MCG TABLET: 75 | 90 days supply | Qty: 90 | Fill #1

## 2017-11-06 IMAGING — DX DG RIBS 2V*L*
4 series · 4 of 4 positions shown · non-contrast
Comparison: Chest x-ray dated 12/16/2013

CLINICAL DATA: Left lower lateral rib pain and cough after a fall 4
days ago.

EXAM:
LEFT RIBS - 2 VIEW

[rib pa (1 of 2)]
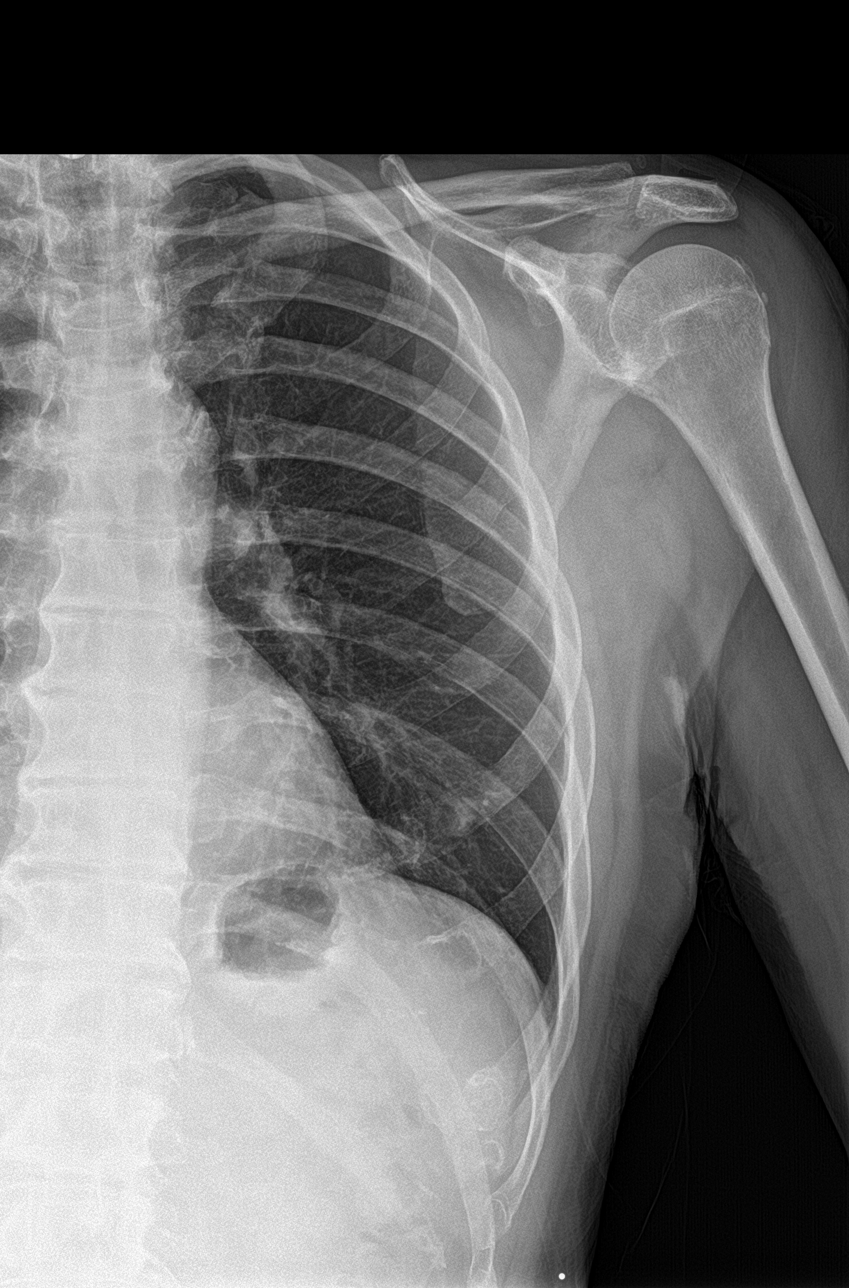

[rib pa (2 of 2)]
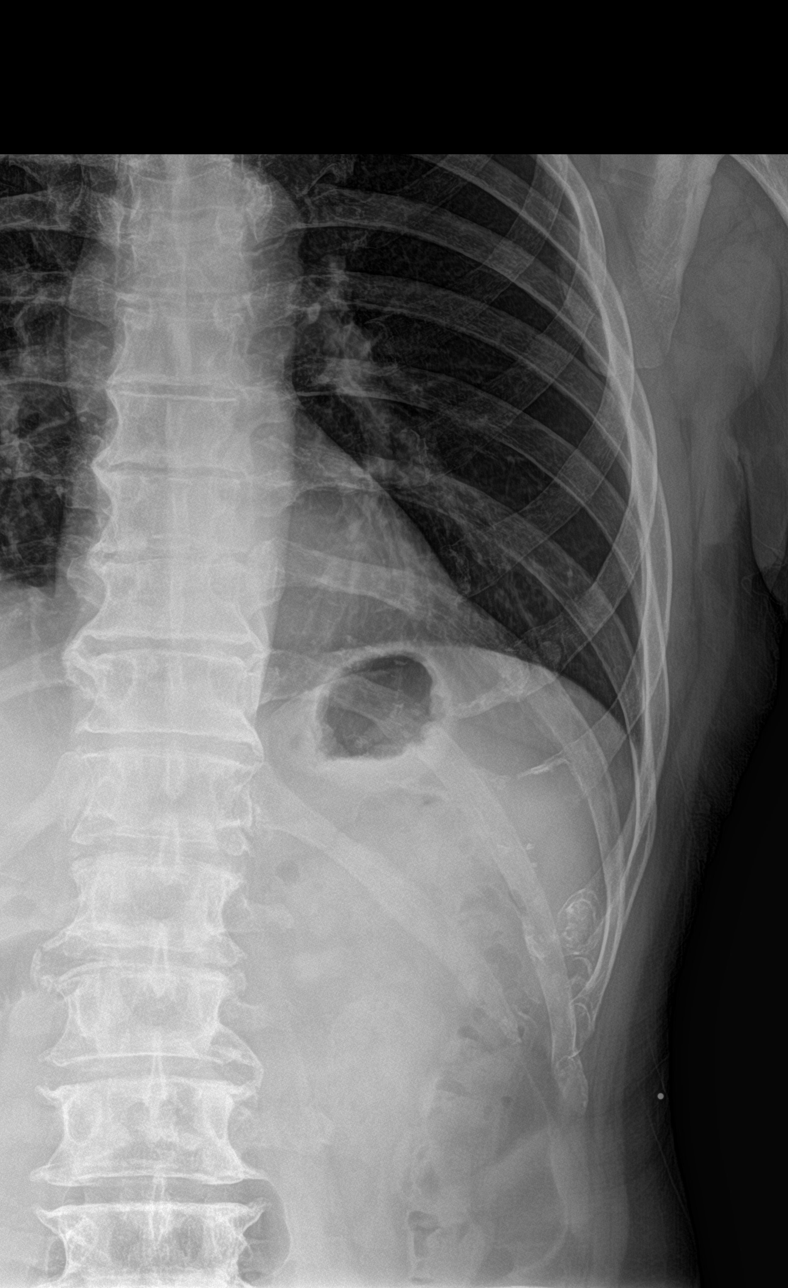

[rib obl (1 of 2)]
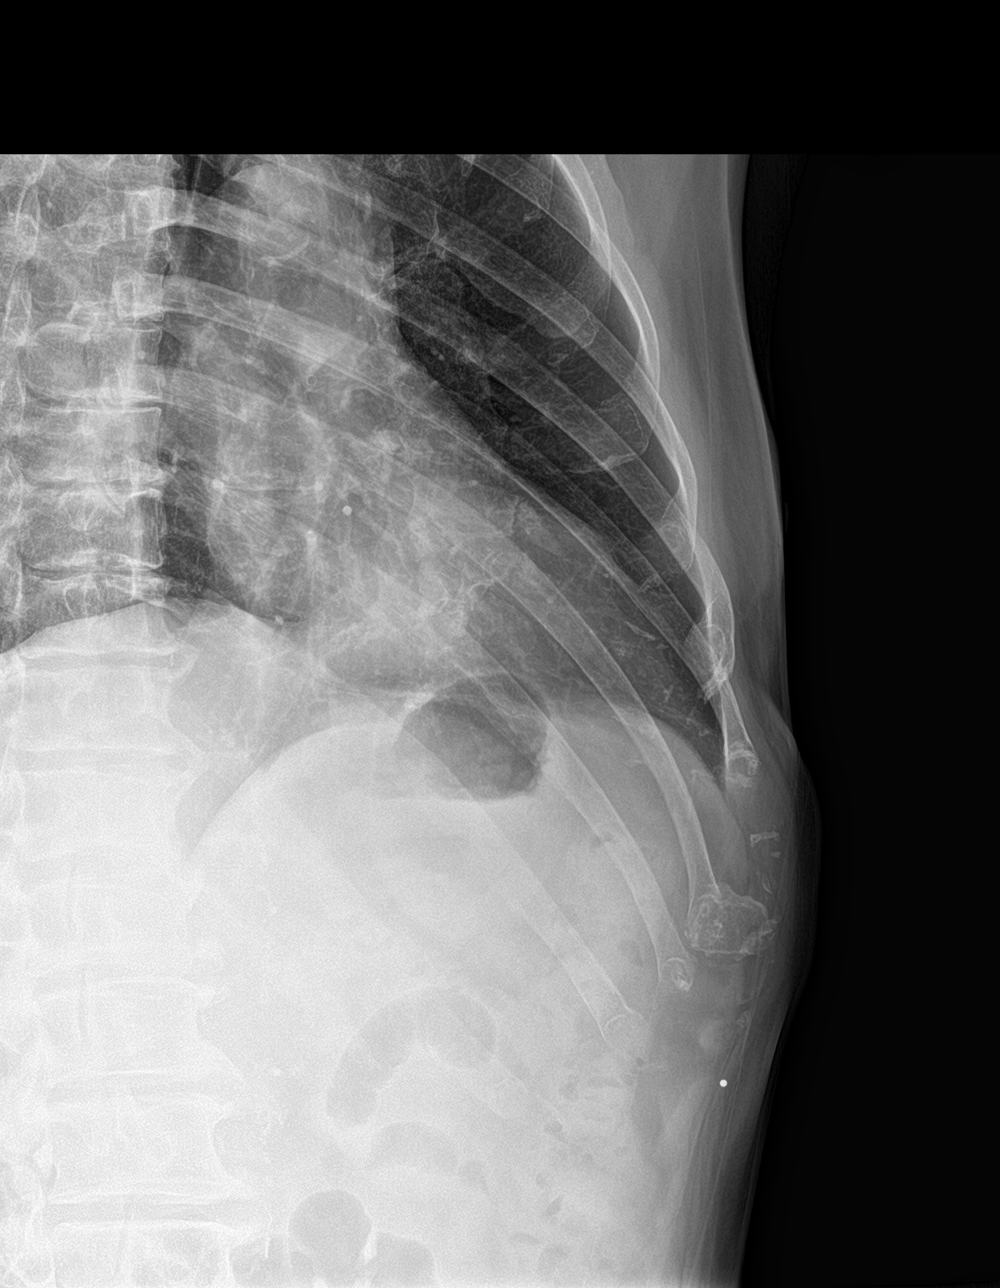

[rib obl (2 of 2)]
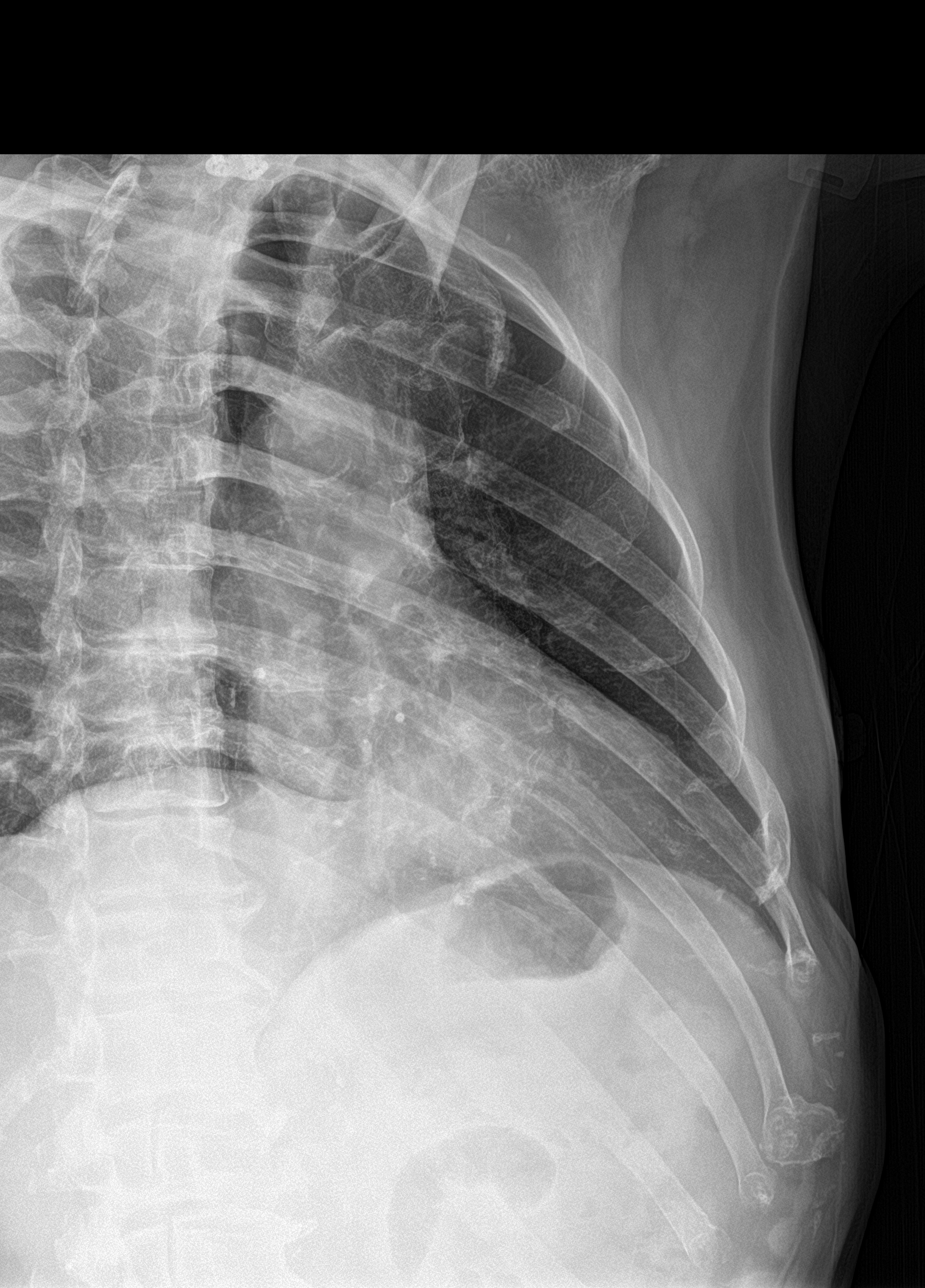

[4 of 4 positions shown; findings below may reference images not displayed]

FINDINGS: There subtle deformities of the anterior lateral aspects of the left
tenth and eleventh ribs at the costal chondral junctions which could
represent hairline nondisplaced fractures. The ribs otherwise appear
normal. No pneumothorax or lung contusion or pleural effusion.
IMPRESSION: Possible subtle hairline fractures of the anterior lateral aspects
of the left tenth and eleventh ribs.

## 2017-11-06 IMAGING — DX DG CHEST 2V
2 series · 2 of 2 positions shown · non-contrast
Comparison: 12/16/2013.

ADDENDUM:
Reference is made to left rib series for further evaluation of bony
structures. It should be noted that there old right anterior rib
fractures.
CLINICAL DATA: Fall.

EXAM:
CHEST  2 VIEW

[chest pa]
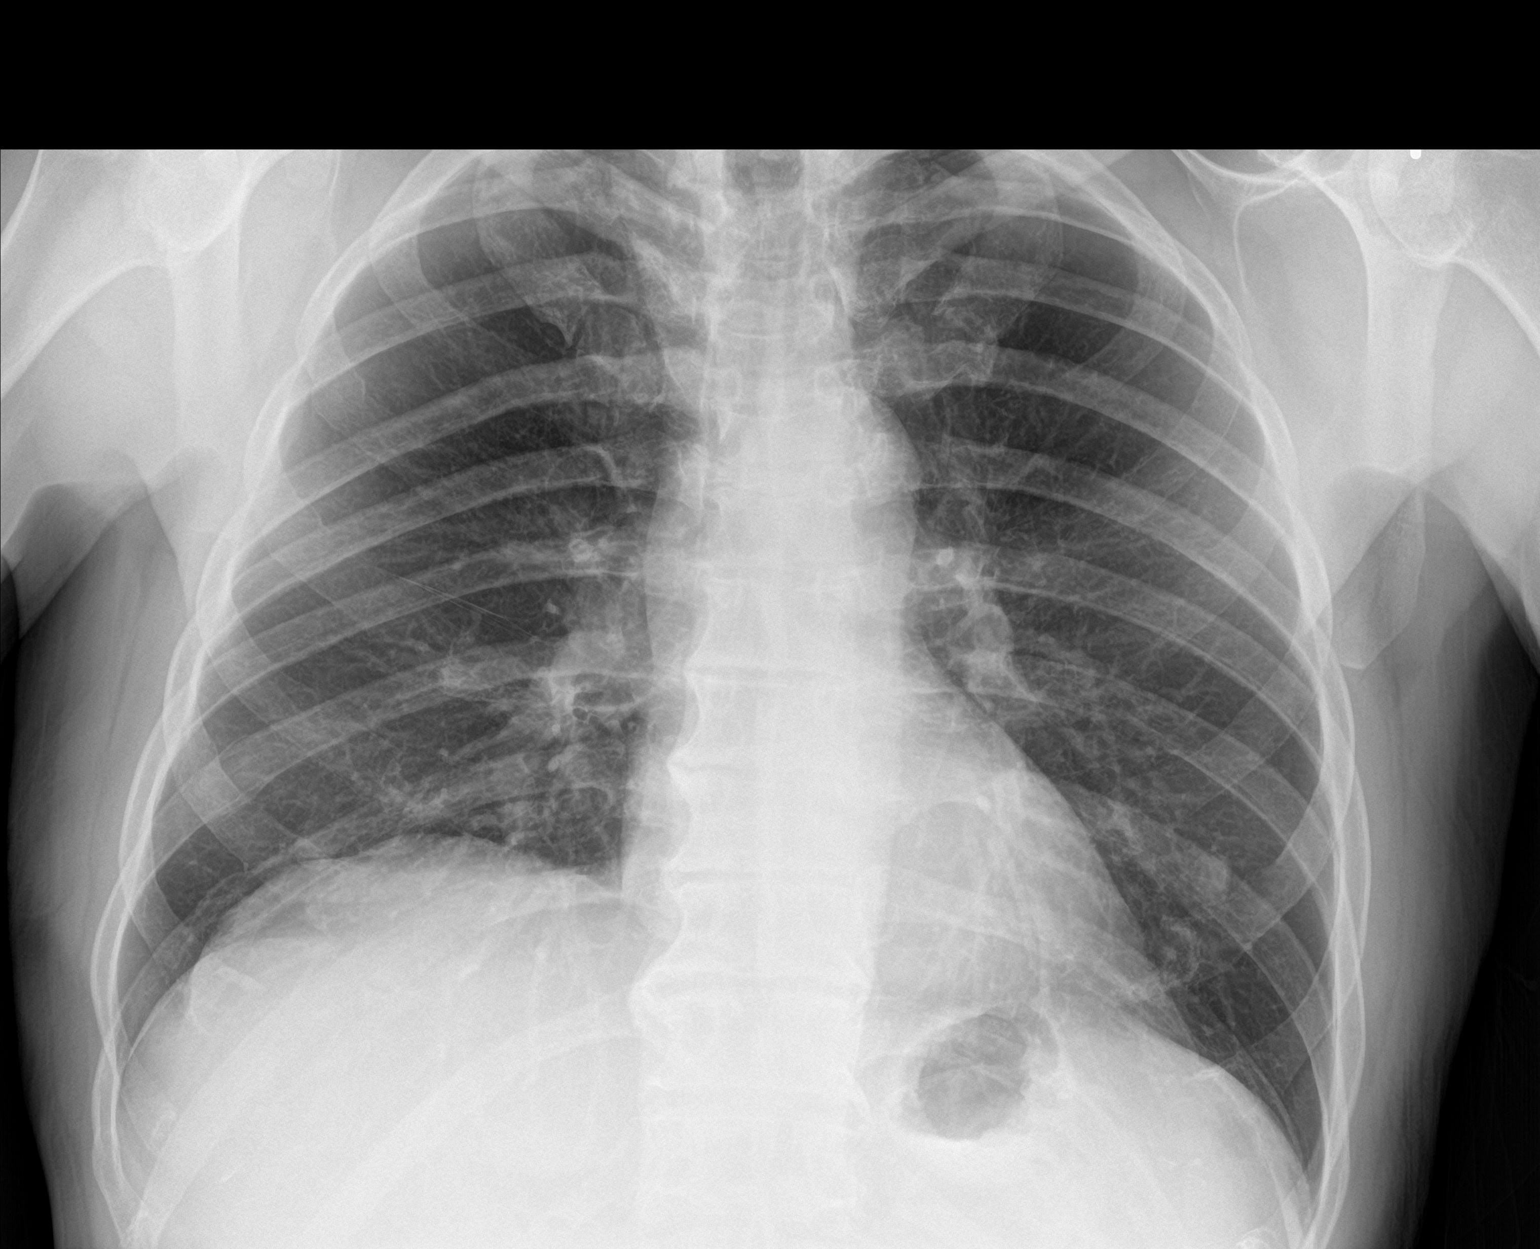

[chest lat]
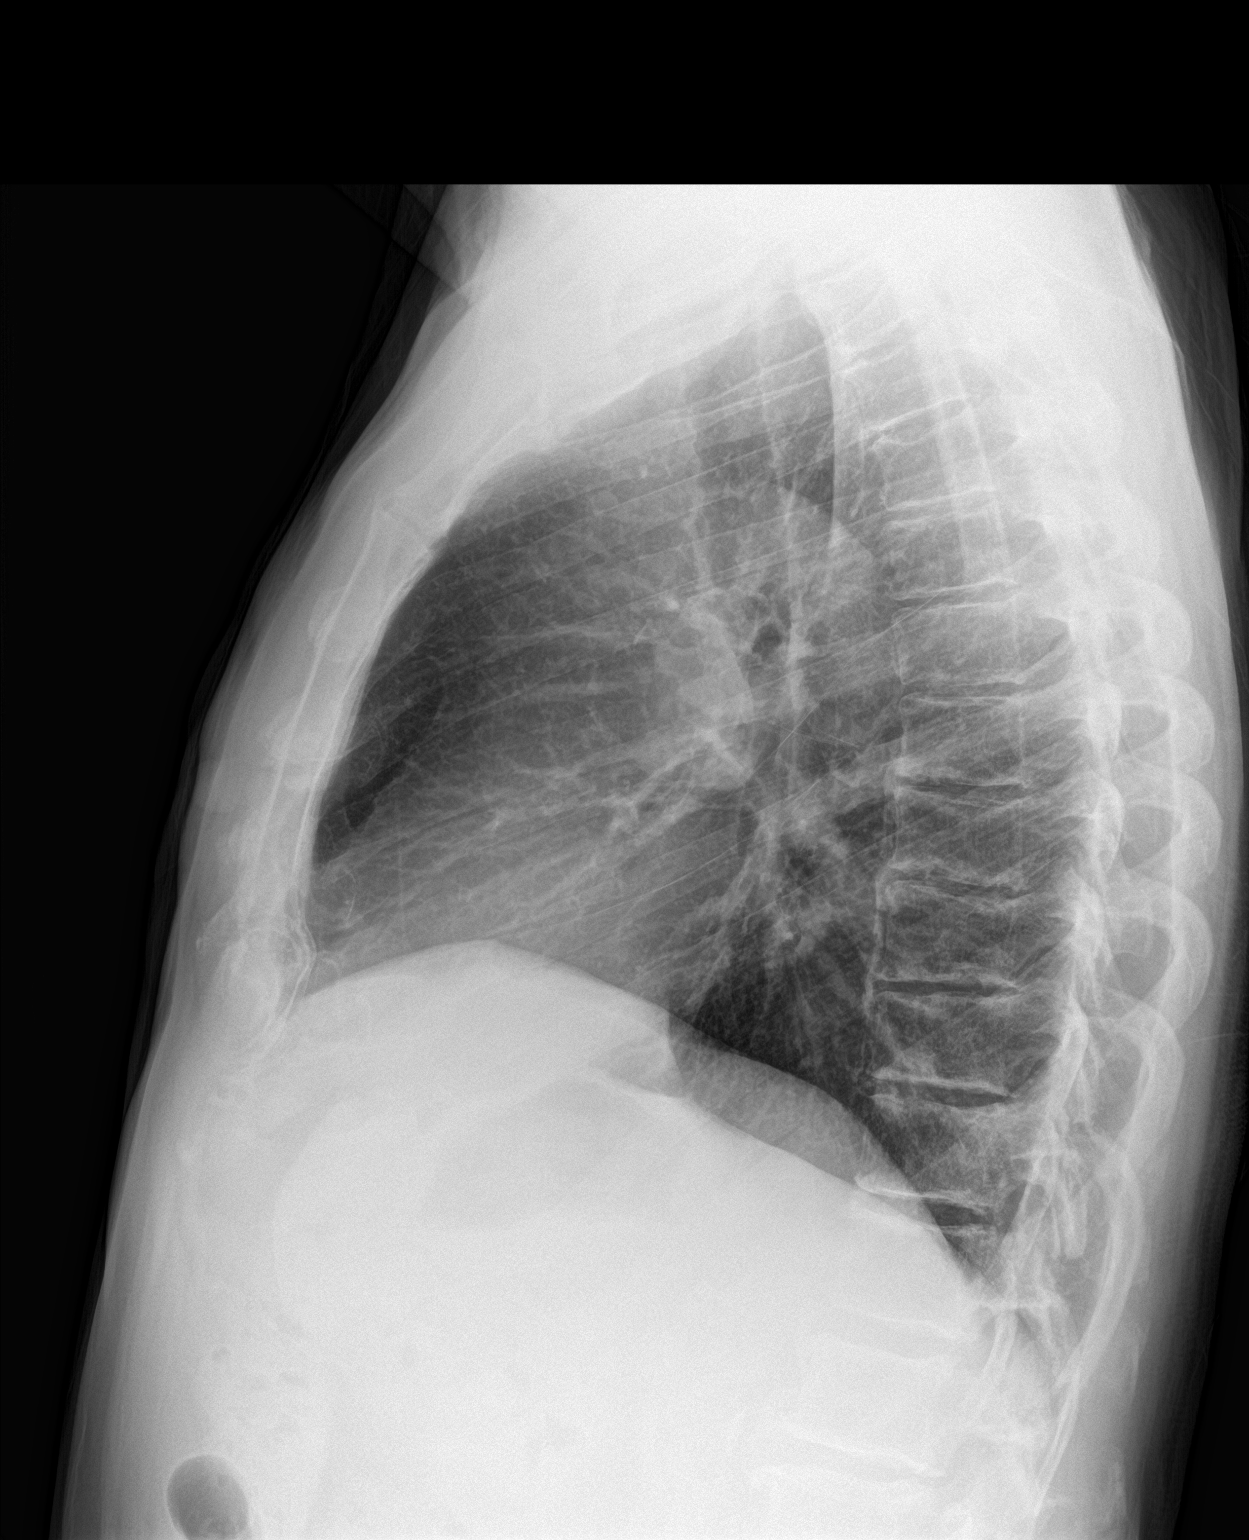

[2 of 2 positions shown; findings below may reference images not displayed]

FINDINGS: Mediastinum and hilar structures normal. Lungs are clear. No pleural
effusion or pneumothorax. Nodular density noted over both lung bases
noted consistent with nipple shadows. These are stable. No acute
bony abnormality . Prior cervical spine fusion.
IMPRESSION: No acute cardiopulmonary disease.

## 2017-11-07 ENCOUNTER — Ambulatory Visit: Payer: Medicare Other

## 2017-11-08 ENCOUNTER — Ambulatory Visit (INDEPENDENT_AMBULATORY_CARE_PROVIDER_SITE_OTHER): Payer: Medicare Other

## 2017-11-08 DIAGNOSIS — E538 Deficiency of other specified B group vitamins: Secondary | ICD-10-CM

## 2017-11-08 MED ORDER — CYANOCOBALAMIN 1000 MCG/ML IJ SOLN
1000.0000 ug | Freq: Once | INTRAMUSCULAR | Status: AC
  Administered 2017-11-08: 1000 ug via INTRAMUSCULAR

## 2017-11-08 NOTE — Progress Notes (Signed)
Medical screening examination/treatment/procedure(s) were performed by non-physician practitioner and as supervising physician I was immediately available for consultation/collaboration. I agree with above. Jetaun Colbath, MD   

## 2017-12-02 MED FILL — LISINOPRIL 20 MG TABLET: 20 | 90 days supply | Qty: 90 | Fill #1

## 2017-12-02 MED FILL — HYDROCHLOROTHIAZIDE 12.5 MG: 12.5 | 90 days supply | Qty: 90 | Fill #1

## 2017-12-09 ENCOUNTER — Ambulatory Visit: Payer: Medicare Other

## 2017-12-19 ENCOUNTER — Ambulatory Visit (INDEPENDENT_AMBULATORY_CARE_PROVIDER_SITE_OTHER): Payer: Medicare Other

## 2017-12-19 DIAGNOSIS — E538 Deficiency of other specified B group vitamins: Secondary | ICD-10-CM | POA: Diagnosis not present

## 2017-12-19 MED ORDER — CYANOCOBALAMIN 1000 MCG/ML IJ SOLN
1000.0000 ug | Freq: Once | INTRAMUSCULAR | Status: AC
  Administered 2017-12-19: 1000 ug via INTRAMUSCULAR

## 2017-12-19 NOTE — Progress Notes (Signed)
Medical screening examination/treatment/procedure(s) were performed by non-physician practitioner and as supervising physician I was immediately available for consultation/collaboration. I agree with above. Addelynn Batte, MD   

## 2018-01-20 ENCOUNTER — Ambulatory Visit (INDEPENDENT_AMBULATORY_CARE_PROVIDER_SITE_OTHER): Payer: Medicare Other

## 2018-01-20 ENCOUNTER — Other Ambulatory Visit: Payer: Self-pay | Admitting: Internal Medicine

## 2018-01-20 DIAGNOSIS — E538 Deficiency of other specified B group vitamins: Secondary | ICD-10-CM

## 2018-01-20 MED ORDER — CYANOCOBALAMIN 1000 MCG/ML IJ SOLN
1000.0000 ug | Freq: Once | INTRAMUSCULAR | Status: AC
  Administered 2018-01-20: 1000 ug via INTRAMUSCULAR

## 2018-01-20 MED FILL — LEVOTHYROXINE 75 MCG TABLET: 75 | 90 days supply | Qty: 90 | Fill #0

## 2018-01-20 MED FILL — ROSUVASTATIN CALCIUM 20 MG: 20 | 90 days supply | Qty: 90 | Fill #0

## 2018-01-20 NOTE — Progress Notes (Signed)
Medical screening examination/treatment/procedure(s) were performed by non-physician practitioner and as supervising physician I was immediately available for consultation/collaboration. I agree with above. Marycarmen Hagey, MD   

## 2018-02-14 ENCOUNTER — Ambulatory Visit (INDEPENDENT_AMBULATORY_CARE_PROVIDER_SITE_OTHER): Payer: Medicare Other | Admitting: Internal Medicine

## 2018-02-14 ENCOUNTER — Encounter: Payer: Self-pay | Admitting: Internal Medicine

## 2018-02-14 VITALS — BP 146/76 | HR 68 | Ht 69.0 in | Wt 169.0 lb

## 2018-02-14 DIAGNOSIS — E785 Hyperlipidemia, unspecified: Secondary | ICD-10-CM | POA: Diagnosis not present

## 2018-02-14 DIAGNOSIS — I10 Essential (primary) hypertension: Secondary | ICD-10-CM | POA: Diagnosis not present

## 2018-02-14 DIAGNOSIS — N138 Other obstructive and reflux uropathy: Secondary | ICD-10-CM

## 2018-02-14 DIAGNOSIS — E538 Deficiency of other specified B group vitamins: Secondary | ICD-10-CM | POA: Diagnosis not present

## 2018-02-14 DIAGNOSIS — N401 Enlarged prostate with lower urinary tract symptoms: Secondary | ICD-10-CM | POA: Diagnosis not present

## 2018-02-14 HISTORY — DX: Deficiency of other specified B group vitamins: E53.8

## 2018-02-14 MED ORDER — TAMSULOSIN HCL 0.4 MG PO CAPS: 0.4000 mg | ORAL_CAPSULE | Freq: Every day | ORAL | 3 refills | Status: DC

## 2018-02-14 MED ORDER — CYANOCOBALAMIN 1000 MCG/ML IJ SOLN
1000.0000 ug | Freq: Once | INTRAMUSCULAR | Status: AC
  Administered 2018-02-14: 1000 ug via INTRAMUSCULAR

## 2018-02-14 MED FILL — TAMSULOSIN HCL 0.4 MG CAP: 0.4 | 90 days supply | Qty: 90 | Fill #0

## 2018-02-14 NOTE — Patient Instructions (Addendum)
You had the vitamin B12 shot today  Please also start OTC Vitamin B12 at one per day  You will be contacted regarding the referral for: flomax  You will be contacted regarding the referral for: Urology  Please continue all other medications as before, and refills have been done if requested.  Please have the pharmacy call with any other refills you may need.  Please keep your appointments with your specialists as you may have planned  Please return in 6 months, or sooner if needed, with Lab testing done 3-5 days before
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
## 2018-02-16 NOTE — Assessment & Plan Note (Signed)
For b12 IM 1000 mg, then otc vit b12 daily indefinitely

## 2018-02-16 NOTE — Assessment & Plan Note (Signed)
Ok for flomax to start, also for urology referral, consider ED if not improved and pain increased

## 2018-02-16 NOTE — Assessment & Plan Note (Signed)
Lab Results  Component Value Date   LDLCALC 38 02/12/2017  stable overall by history and exam, recent data reviewed with pt, and pt to continue medical treatment as before,  to f/u any worsening symptoms or concerns

## 2018-02-16 NOTE — Assessment & Plan Note (Signed)
stable overall by history and exam, recent data reviewed with pt, and pt to continue medical treatment as before,  to f/u any worsening symptoms or concerns BP Readings from Last 3 Encounters:  02/14/18 (!) 146/76  09/16/17 116/72  04/30/17 116/68

## 2018-02-28 MED FILL — HYDROCHLOROTHIAZIDE 12.5 MG: 12.5 | 90 days supply | Qty: 90 | Fill #2

## 2018-02-28 MED FILL — LISINOPRIL 20 MG TABLET: 20 | 90 days supply | Qty: 90 | Fill #2

## 2018-04-16 ENCOUNTER — Telehealth: Payer: Self-pay | Admitting: Emergency Medicine

## 2018-04-16 NOTE — Telephone Encounter (Signed)
Called patient to schdule AWV. Patient declined at this time. 

## 2018-04-30 MED FILL — ROSUVASTATIN CALCIUM 20 MG: 20 | 90 days supply | Qty: 90 | Fill #1

## 2018-04-30 MED FILL — LEVOTHYROXINE 75 MCG TABLET: 75 | 90 days supply | Qty: 90 | Fill #1

## 2018-05-14 MED FILL — TAMSULOSIN HCL 0.4 MG CAP: 0.4 | 90 days supply | Qty: 90 | Fill #1

## 2018-06-02 ENCOUNTER — Other Ambulatory Visit: Payer: Self-pay | Admitting: Internal Medicine

## 2018-06-02 MED FILL — LISINOPRIL 20 MG TABLET: 20 | 90 days supply | Qty: 90 | Fill #0

## 2018-06-02 MED FILL — HYDROCHLOROTHIAZIDE 12.5 MG: 12.5 | 90 days supply | Qty: 90 | Fill #0

## 2018-08-02 MED FILL — ROSUVASTATIN CALCIUM 20 MG: 20 | 90 days supply | Qty: 90 | Fill #2

## 2018-08-02 MED FILL — LEVOTHYROXINE 75 MCG TABLET: 75 | 90 days supply | Qty: 90 | Fill #2

## 2018-08-19 ENCOUNTER — Ambulatory Visit: Payer: Medicare Other | Admitting: Internal Medicine

## 2018-08-19 MED FILL — TAMSULOSIN HCL 0.4 MG CAP: 0.4 | 90 days supply | Qty: 90 | Fill #2

## 2018-08-20 ENCOUNTER — Encounter: Payer: Self-pay | Admitting: Internal Medicine

## 2018-08-20 ENCOUNTER — Other Ambulatory Visit (INDEPENDENT_AMBULATORY_CARE_PROVIDER_SITE_OTHER): Payer: Medicare Other

## 2018-08-20 ENCOUNTER — Ambulatory Visit (INDEPENDENT_AMBULATORY_CARE_PROVIDER_SITE_OTHER): Payer: Medicare Other | Admitting: Internal Medicine

## 2018-08-20 VITALS — BP 120/72 | HR 86 | Temp 98.3°F | Ht 69.0 in | Wt 177.0 lb

## 2018-08-20 DIAGNOSIS — E785 Hyperlipidemia, unspecified: Secondary | ICD-10-CM | POA: Diagnosis not present

## 2018-08-20 DIAGNOSIS — E538 Deficiency of other specified B group vitamins: Secondary | ICD-10-CM

## 2018-08-20 DIAGNOSIS — E039 Hypothyroidism, unspecified: Secondary | ICD-10-CM

## 2018-08-20 DIAGNOSIS — I10 Essential (primary) hypertension: Secondary | ICD-10-CM

## 2018-08-20 LAB — BASIC METABOLIC PANEL
BUN: 21 mg/dL (ref 6–23)
CO2: 29 mEq/L (ref 19–32)
Calcium: 9.6 mg/dL (ref 8.4–10.5)
Chloride: 102 mEq/L (ref 96–112)
Creatinine, Ser: 0.98 mg/dL (ref 0.40–1.50)
GFR: 77.13 mL/min (ref >60.00)
Glucose, Bld: 109 mg/dL — ABNORMAL HIGH (ref 70–99)
POTASSIUM: 4 meq/L (ref 3.5–5.1)
Sodium: 139 mEq/L (ref 135–145)

## 2018-08-20 LAB — CBC WITH DIFFERENTIAL/PLATELET
Basophils Absolute: 0.1 10*3/uL (ref 0.0–0.1)
Basophils Relative: 1.4 % (ref 0.0–3.0)
Eosinophils Absolute: 0.4 10*3/uL (ref 0.0–0.7)
Eosinophils Relative: 5.9 % — ABNORMAL HIGH (ref 0.0–5.0)
HEMATOCRIT: 38.9 % — AB (ref 39.0–52.0)
Hemoglobin: 13.1 g/dL (ref 13.0–17.0)
Lymphocytes Relative: 31.3 % (ref 12.0–46.0)
Lymphs Abs: 1.9 10*3/uL (ref 0.7–4.0)
MCHC: 33.7 g/dL (ref 30.0–36.0)
MCV: 95.2 fl (ref 78.0–100.0)
Monocytes Absolute: 0.6 10*3/uL (ref 0.1–1.0)
Monocytes Relative: 9.3 % (ref 3.0–12.0)
Neutro Abs: 3.2 10*3/uL (ref 1.4–7.7)
Neutrophils Relative %: 52.1 % (ref 43.0–77.0)
Platelets: 253 10*3/uL (ref 150.0–400.0)
RBC: 4.09 Mil/uL — ABNORMAL LOW (ref 4.22–5.81)
RDW: 14.5 % (ref 11.5–15.5)
WBC: 6.1 10*3/uL (ref 4.0–10.5)

## 2018-08-20 LAB — URINALYSIS, ROUTINE W REFLEX MICROSCOPIC
Bilirubin Urine: NEGATIVE
Hgb urine dipstick: NEGATIVE
Ketones, ur: NEGATIVE
Leukocytes, UA: NEGATIVE
NITRITE: NEGATIVE
RBC / HPF: NONE SEEN (ref 0–?)
Specific Gravity, Urine: 1.02 (ref 1.000–1.030)
TOTAL PROTEIN, URINE-UPE24: NEGATIVE
Urine Glucose: NEGATIVE
Urobilinogen, UA: 0.2 (ref 0.0–1.0)
WBC, UA: NONE SEEN (ref 0–?)
pH: 5.5 (ref 5.0–8.0)

## 2018-08-20 LAB — LIPID PANEL
Cholesterol: 126 mg/dL (ref 0–200)
HDL: 41.2 mg/dL (ref >39.00)
LDL Cholesterol: 53 mg/dL (ref 0–99)
NonHDL: 85.01
Total CHOL/HDL Ratio: 3
Triglycerides: 159 mg/dL — ABNORMAL HIGH (ref 0.0–149.0)
VLDL: 31.8 mg/dL (ref 0.0–40.0)

## 2018-08-20 LAB — HEPATIC FUNCTION PANEL
ALT: 30 U/L (ref 0–53)
AST: 26 U/L (ref 0–37)
Albumin: 4.4 g/dL (ref 3.5–5.2)
Alkaline Phosphatase: 37 U/L — ABNORMAL LOW (ref 39–117)
Bilirubin, Direct: 0.1 mg/dL (ref 0.0–0.3)
Total Bilirubin: 0.6 mg/dL (ref 0.2–1.2)
Total Protein: 7.1 g/dL (ref 6.0–8.3)

## 2018-08-20 LAB — VITAMIN B12: Vitamin B-12: 520 pg/mL (ref 211–911)

## 2018-08-20 LAB — TSH: TSH: 2.24 u[IU]/mL (ref 0.35–4.50)

## 2018-08-20 NOTE — Patient Instructions (Signed)
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
## 2018-08-20 NOTE — Assessment & Plan Note (Signed)
stable overall by history and exam, recent data reviewed with pt, and pt to continue medical treatment as before,  to f/u any worsening symptoms or concerns  

## 2018-08-20 NOTE — Assessment & Plan Note (Signed)
To cont b12 supplement, for f/u lab

## 2018-08-29 NOTE — Telephone Encounter (Signed)
error 

## 2018-09-04 MED FILL — HYDROCHLOROTHIAZIDE 12.5 MG: 12.5 | 90 days supply | Qty: 90 | Fill #1

## 2018-09-04 MED FILL — LISINOPRIL 20 MG TABLET: 20 | 90 days supply | Qty: 90 | Fill #1

## 2018-09-09 ENCOUNTER — Ambulatory Visit: Payer: Self-pay

## 2018-09-09 NOTE — Telephone Encounter (Signed)
Wife reports pt. Has had a cough for 1 week. Was working outside yesterday, and last night his cough was worse. Congested. "Coughing a little up" but unsure of color of mucus. No fever or other symptoms.No availability today - offered another location, but refused. "I can wait until Thursday." Appointment made.Instrcuted if symptoms worsen to go to ED. Verbalizes understanding.   Reason for Disposition . Cough has been present for > 3 weeks  Answer Assessment - Initial Assessment Questions 1. ONSET: "When did the cough begin?"      Started 1 week ago 2. SEVERITY: "How bad is the cough today?"      Moderate 3. RESPIRATORY DISTRESS: "Describe your breathing."      Just congestion 4. FEVER: "Do you have a fever?" If so, ask: "What is your temperature, how was it measured, and when did it start?"     No 5. SPUTUM: "Describe the color of your sputum" (clear, white, yellow, green)     Unsure 6. HEMOPTYSIS: "Are you coughing up any blood?" If so ask: "How much?" (flecks, streaks, tablespoons, etc.)     No 7. CARDIAC HISTORY: "Do you have any history of heart disease?" (e.g., heart attack, congestive heart failure)      No 8. LUNG HISTORY: "Do you have any history of lung disease?"  (e.g., pulmonary embolus, asthma, emphysema)     No 9. PE RISK FACTORS: "Do you have a history of blood clots?" (or: recent major surgery, recent prolonged travel, bedridden)     No 10. OTHER SYMPTOMS: "Do you have any other symptoms?" (e.g., runny nose, wheezing, chest pain)       Rattling 11. PREGNANCY: "Is there any chance you are pregnant?" "When was your last menstrual period?"       N/A 12. TRAVEL: "Have you traveled out of the country in the last month?" (e.g., travel history, exposures)       No  Protocols used: COUGH - ACUTE PRODUCTIVE-A-AH

## 2018-09-09 NOTE — Telephone Encounter (Signed)
Noted  

## 2018-09-11 ENCOUNTER — Ambulatory Visit (INDEPENDENT_AMBULATORY_CARE_PROVIDER_SITE_OTHER)
Admission: RE | Admit: 2018-09-11 | Discharge: 2018-09-11 | Disposition: A | Discharge status: Home / Self Care | Payer: Medicare Other | Source: Ambulatory Visit | Attending: Internal Medicine | Admitting: Internal Medicine

## 2018-09-11 ENCOUNTER — Ambulatory Visit (INDEPENDENT_AMBULATORY_CARE_PROVIDER_SITE_OTHER): Payer: Medicare Other | Admitting: Internal Medicine

## 2018-09-11 ENCOUNTER — Encounter: Payer: Self-pay | Admitting: Internal Medicine

## 2018-09-11 VITALS — BP 118/62 | HR 70 | Temp 98.5°F | Ht 69.0 in | Wt 168.0 lb

## 2018-09-11 DIAGNOSIS — J309 Allergic rhinitis, unspecified: Secondary | ICD-10-CM | POA: Diagnosis not present

## 2018-09-11 DIAGNOSIS — I10 Essential (primary) hypertension: Secondary | ICD-10-CM

## 2018-09-11 DIAGNOSIS — R05 Cough: Secondary | ICD-10-CM

## 2018-09-11 DIAGNOSIS — R059 Cough, unspecified: Secondary | ICD-10-CM

## 2018-09-11 MED ORDER — AZITHROMYCIN 250 MG PO TABS: ORAL_TABLET | ORAL | 1 refills | Status: DC

## 2018-09-11 MED ORDER — BENZONATATE 100 MG PO CAPS: ORAL_CAPSULE | ORAL | 0 refills | Status: DC

## 2018-09-11 MED FILL — BENZONATATE 100 MG CAP: 100 | 7 days supply | Qty: 60 | Fill #0

## 2018-09-11 MED FILL — AZITHROMYCIN 250 MG TABLET: 250 | 5 days supply | Qty: 6 | Fill #0

## 2018-09-11 NOTE — Assessment & Plan Note (Signed)
Mild to mod, c/w bronchitis vs pna,  for antibx course, cough med prn, cxr,  to f/u any worsening symptoms or concerns 

## 2018-09-11 NOTE — Patient Instructions (Signed)
Please take all new medication as prescribed - the antibiotic, and cough pills as needed  Please continue all other medications as before, and refills have been done if requested.  Please have the pharmacy call with any other refills you may need.  Please keep your appointments with your specialists as you may have planned  Please go to the XRAY Department in the Basement (go straight as you get off the elevator) for the x-ray testing  You will be contacted by phone if any changes need to be made immediately.  Otherwise, you will receive a letter about your results with an explanation, but please check with MyChart first.  Please remember to sign up for MyChart if you have not done so, as this will be important to you in the future with finding out test results, communicating by private email, and scheduling acute appointments online when needed.

## 2018-09-11 NOTE — Assessment & Plan Note (Signed)
Mild, to restart nasacort asd,  to f/u any worsening symptoms or concerns

## 2018-09-11 NOTE — Assessment & Plan Note (Signed)
stable overall by history and exam, recent data reviewed with pt, and pt to continue medical treatment as before,  to f/u any worsening symptoms or concerns  
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
## 2018-11-03 ENCOUNTER — Other Ambulatory Visit: Payer: Self-pay | Admitting: Internal Medicine

## 2018-11-03 MED FILL — ROSUVASTATIN CALCIUM 20 MG: 20 | 90 days supply | Qty: 90 | Fill #0

## 2018-11-03 MED FILL — LEVOTHYROXINE 75 MCG TABLET: 75 | 90 days supply | Qty: 90 | Fill #0

## 2018-12-02 MED FILL — HYDROCHLOROTHIAZIDE 12.5 MG: 12.5 | 90 days supply | Qty: 90 | Fill #2

## 2018-12-17 MED FILL — LISINOPRIL 20 MG TABLET: 20 | 90 days supply | Qty: 90 | Fill #2

## 2019-02-04 MED FILL — LEVOTHYROXINE 75 MCG TABLET: 75 | 90 days supply | Qty: 90 | Fill #1

## 2019-02-04 MED FILL — ROSUVASTATIN CALCIUM 20 MG: 20 | 90 days supply | Qty: 90 | Fill #1

## 2019-02-20 ENCOUNTER — Ambulatory Visit: Payer: Medicare Other

## 2019-02-20 ENCOUNTER — Encounter: Payer: Self-pay | Admitting: Internal Medicine

## 2019-02-20 ENCOUNTER — Other Ambulatory Visit (INDEPENDENT_AMBULATORY_CARE_PROVIDER_SITE_OTHER): Payer: Medicare Other

## 2019-02-20 ENCOUNTER — Ambulatory Visit (INDEPENDENT_AMBULATORY_CARE_PROVIDER_SITE_OTHER): Payer: Medicare Other | Admitting: Internal Medicine

## 2019-02-20 ENCOUNTER — Other Ambulatory Visit: Payer: Self-pay

## 2019-02-20 VITALS — BP 120/70 | HR 71 | Temp 98.5°F | Ht 69.0 in | Wt 164.0 lb

## 2019-02-20 DIAGNOSIS — Z0000 Encounter for general adult medical examination without abnormal findings: Secondary | ICD-10-CM

## 2019-02-20 LAB — LIPID PANEL
Cholesterol: 115 mg/dL (ref 0–200)
HDL: 31.1 mg/dL — ABNORMAL LOW (ref >39.00)
NonHDL: 84.32
Total CHOL/HDL Ratio: 4
Triglycerides: 307 mg/dL — ABNORMAL HIGH (ref 0.0–149.0)
VLDL: 61.4 mg/dL — ABNORMAL HIGH (ref 0.0–40.0)

## 2019-02-20 LAB — CBC WITH DIFFERENTIAL/PLATELET
Basophils Absolute: 0.1 10*3/uL (ref 0.0–0.1)
Basophils Relative: 1.1 % (ref 0.0–3.0)
Eosinophils Absolute: 0.5 10*3/uL (ref 0.0–0.7)
Eosinophils Relative: 6.3 % — ABNORMAL HIGH (ref 0.0–5.0)
HCT: 37.6 % — ABNORMAL LOW (ref 39.0–52.0)
Hemoglobin: 12.8 g/dL — ABNORMAL LOW (ref 13.0–17.0)
Lymphocytes Relative: 32.5 % (ref 12.0–46.0)
Lymphs Abs: 2.3 10*3/uL (ref 0.7–4.0)
MCHC: 33.9 g/dL (ref 30.0–36.0)
MCV: 95.3 fl (ref 78.0–100.0)
Monocytes Absolute: 0.6 10*3/uL (ref 0.1–1.0)
Monocytes Relative: 8.8 % (ref 3.0–12.0)
Neutro Abs: 3.6 10*3/uL (ref 1.4–7.7)
Neutrophils Relative %: 51.3 % (ref 43.0–77.0)
Platelets: 283 10*3/uL (ref 150.0–400.0)
RBC: 3.95 Mil/uL — ABNORMAL LOW (ref 4.22–5.81)
RDW: 14.5 % (ref 11.5–15.5)
WBC: 7.1 10*3/uL (ref 4.0–10.5)

## 2019-02-20 LAB — URINALYSIS, ROUTINE W REFLEX MICROSCOPIC
Bilirubin Urine: NEGATIVE
Hgb urine dipstick: NEGATIVE
Ketones, ur: NEGATIVE
Leukocytes,Ua: NEGATIVE
Nitrite: NEGATIVE
Specific Gravity, Urine: >=1.03 — AB (ref 1.000–1.030)
Total Protein, Urine: NEGATIVE
Urine Glucose: NEGATIVE
Urobilinogen, UA: 0.2 (ref 0.0–1.0)
WBC, UA: NONE SEEN (ref 0–?)
pH: 5.5 (ref 5.0–8.0)

## 2019-02-20 LAB — BASIC METABOLIC PANEL
BUN: 25 mg/dL — ABNORMAL HIGH (ref 6–23)
CO2: 25 mEq/L (ref 19–32)
Calcium: 9.4 mg/dL (ref 8.4–10.5)
Chloride: 102 mEq/L (ref 96–112)
Creatinine, Ser: 1.06 mg/dL (ref 0.40–1.50)
GFR: 66.21 mL/min (ref >60.00)
Glucose, Bld: 96 mg/dL (ref 70–99)
Potassium: 4.1 mEq/L (ref 3.5–5.1)
Sodium: 139 mEq/L (ref 135–145)

## 2019-02-20 LAB — HEPATIC FUNCTION PANEL
ALT: 30 U/L (ref 0–53)
AST: 26 U/L (ref 0–37)
Albumin: 4.2 g/dL (ref 3.5–5.2)
Alkaline Phosphatase: 43 U/L (ref 39–117)
Bilirubin, Direct: 0.1 mg/dL (ref 0.0–0.3)
Total Bilirubin: 0.4 mg/dL (ref 0.2–1.2)
Total Protein: 7 g/dL (ref 6.0–8.3)

## 2019-02-20 LAB — LDL CHOLESTEROL, DIRECT: Direct LDL: 55 mg/dL

## 2019-02-20 LAB — TSH: TSH: 0.82 u[IU]/mL (ref 0.35–4.50)

## 2019-02-20 NOTE — Patient Instructions (Signed)
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
## 2019-02-21 ENCOUNTER — Encounter: Payer: Self-pay | Admitting: Internal Medicine

## 2019-02-21 NOTE — Assessment & Plan Note (Signed)

## 2019-03-03 ENCOUNTER — Other Ambulatory Visit: Payer: Self-pay | Admitting: Internal Medicine

## 2019-03-03 MED FILL — HYDROCHLOROTHIAZIDE 12.5 MG: 12.5 | 90 days supply | Qty: 90 | Fill #0

## 2019-03-03 MED FILL — LISINOPRIL 20 MG TABLET: 20 | 90 days supply | Qty: 90 | Fill #0

## 2019-04-02 ENCOUNTER — Telehealth: Payer: Self-pay | Admitting: Internal Medicine

## 2019-04-02 DIAGNOSIS — H9193 Unspecified hearing loss, bilateral: Secondary | ICD-10-CM

## 2019-04-02 NOTE — Telephone Encounter (Signed)
Referral done

## 2019-04-02 NOTE — Telephone Encounter (Signed)
Copied from [place] [phone]. Topic: Referral - Request for Referral >> Apr 02, 2019 12:45 PM Erick Blinks wrote: Has patient seen PCP for this complaint? Yes *If NO, is insurance requiring patient see PCP for this issue before PCP can refer them? Referral for which specialty: ENT  Preferred provider/office: Highest recommended seeking most affordable option  Reason for referral: Hearing aids, needs 500 dollar hearing aid that is chargeable.

## 2019-04-02 NOTE — Addendum Note (Signed)
Addended by: Biagio Borg on: 04/02/2019 04:32 PM   Modules accepted: Orders

## 2019-05-06 MED FILL — LEVOTHYROXINE 75 MCG TABLET: 75 | 90 days supply | Qty: 90 | Fill #2

## 2019-05-06 MED FILL — ROSUVASTATIN CALCIUM 20 MG: 20 | 90 days supply | Qty: 90 | Fill #2

## 2019-05-13 ENCOUNTER — Other Ambulatory Visit: Payer: Self-pay

## 2019-05-13 ENCOUNTER — Ambulatory Visit (INDEPENDENT_AMBULATORY_CARE_PROVIDER_SITE_OTHER): Payer: Medicare Other | Admitting: Internal Medicine

## 2019-05-13 ENCOUNTER — Encounter: Payer: Self-pay | Admitting: Internal Medicine

## 2019-05-13 VITALS — BP 170/80 | HR 87 | Temp 98.5°F | Ht 69.0 in | Wt 165.0 lb

## 2019-05-13 DIAGNOSIS — I10 Essential (primary) hypertension: Secondary | ICD-10-CM | POA: Diagnosis not present

## 2019-05-13 DIAGNOSIS — E039 Hypothyroidism, unspecified: Secondary | ICD-10-CM | POA: Diagnosis not present

## 2019-05-13 DIAGNOSIS — R21 Rash and other nonspecific skin eruption: Secondary | ICD-10-CM | POA: Diagnosis not present

## 2019-05-13 DIAGNOSIS — F0390 Unspecified dementia without behavioral disturbance: Secondary | ICD-10-CM | POA: Diagnosis not present

## 2019-05-13 MED ORDER — DONEPEZIL HCL 5 MG PO TABS: 5.0000 mg | ORAL_TABLET | Freq: Every day | ORAL | 3 refills | Status: DC

## 2019-05-13 MED ORDER — FLUOCINONIDE 0.05 % EX CREA: 1.0000 "application " | TOPICAL_CREAM | Freq: Two times a day (BID) | CUTANEOUS | 2 refills | Status: DC

## 2019-05-13 MED FILL — DONEPEZIL HCL 5 MG TABLET: 5 | 90 days supply | Qty: 90 | Fill #0

## 2019-05-13 NOTE — Assessment & Plan Note (Signed)
Mild to mod, for lidex prn,  to f/u any worsening symptoms or concerns

## 2019-05-13 NOTE — Assessment & Plan Note (Signed)
stable overall by history and exam, recent data reviewed with pt, and pt to continue medical treatment as before,  to f/u any worsening symptoms or concerns  
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
## 2019-05-13 NOTE — Assessment & Plan Note (Signed)
Dementia vs mild cognitive impairment, for aricept 5 qd,  to f/u any worsening symptoms or concerns

## 2019-05-13 NOTE — Patient Instructions (Signed)
Please take all new medication as prescribed - the cream for the rash, and the aricept 5 mg per day for memory  Please continue all other medications as before, and refills have been done if requested.  Please have the pharmacy call with any other refills you may need.  Please continue your efforts at being more active, low cholesterol diet, and weight control.  Please keep your appointments with your specialists as you may have planned

## 2019-06-03 MED FILL — HYDROCHLOROTHIAZIDE 12.5 MG: 12.5 | 90 days supply | Qty: 90 | Fill #1

## 2019-06-03 MED FILL — LISINOPRIL 20 MG TABLET: 20 | 90 days supply | Qty: 90 | Fill #1

## 2019-07-30 ENCOUNTER — Encounter: Payer: Self-pay | Admitting: Internal Medicine

## 2019-08-05 ENCOUNTER — Other Ambulatory Visit: Payer: Self-pay | Admitting: Internal Medicine

## 2019-08-05 MED FILL — ROSUVASTATIN CALCIUM 20 MG: 20 | 90 days supply | Qty: 90 | Fill #0

## 2019-08-05 MED FILL — LEVOTHYROXINE 75 MCG TABLET: 75 | 90 days supply | Qty: 90 | Fill #0

## 2019-08-14 MED FILL — DONEPEZIL HCL 5 MG TABLET: 5 | 90 days supply | Qty: 90 | Fill #1

## 2019-08-26 ENCOUNTER — Encounter: Payer: Self-pay | Admitting: Internal Medicine

## 2019-08-26 ENCOUNTER — Ambulatory Visit: Payer: Medicare Other | Admitting: Internal Medicine

## 2019-08-26 ENCOUNTER — Ambulatory Visit (INDEPENDENT_AMBULATORY_CARE_PROVIDER_SITE_OTHER): Payer: Medicare Other | Admitting: Internal Medicine

## 2019-08-26 ENCOUNTER — Other Ambulatory Visit: Payer: Self-pay

## 2019-08-26 VITALS — BP 124/76 | HR 65 | Temp 97.9°F | Ht 69.0 in | Wt 169.0 lb

## 2019-08-26 DIAGNOSIS — Z23 Encounter for immunization: Secondary | ICD-10-CM | POA: Diagnosis not present

## 2019-08-26 DIAGNOSIS — E039 Hypothyroidism, unspecified: Secondary | ICD-10-CM

## 2019-08-26 DIAGNOSIS — I10 Essential (primary) hypertension: Secondary | ICD-10-CM

## 2019-08-26 DIAGNOSIS — E785 Hyperlipidemia, unspecified: Secondary | ICD-10-CM | POA: Diagnosis not present

## 2019-08-26 DIAGNOSIS — F0390 Unspecified dementia without behavioral disturbance: Secondary | ICD-10-CM | POA: Diagnosis not present

## 2019-08-26 NOTE — Assessment & Plan Note (Signed)
stable overall by history and exam, recent data reviewed with pt, and pt to continue medical treatment as before,  to f/u any worsening symptoms or concerns  

## 2019-08-26 NOTE — Patient Instructions (Signed)
You had the flu shot today  Please continue all other medications as before, and refills have been done if requested.  Please have the pharmacy call with any other refills you may need.  Please continue your efforts at being more active, low cholesterol diet, and weight control.  Please keep your appointments with your specialists as you may have planned  Please return in 6 months, or sooner if needed 
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
## 2019-09-09 ENCOUNTER — Ambulatory Visit: Payer: Medicare Other | Admitting: Internal Medicine

## 2019-09-14 MED FILL — HYDROCHLOROTHIAZIDE 12.5 MG: 12.5 | 90 days supply | Qty: 90 | Fill #2

## 2019-09-14 MED FILL — LISINOPRIL 20 MG TABLET: 20 | 90 days supply | Qty: 90 | Fill #2

## 2019-09-29 ENCOUNTER — Other Ambulatory Visit: Payer: Self-pay

## 2019-09-29 ENCOUNTER — Ambulatory Visit: Payer: Medicare Other | Attending: Internal Medicine

## 2019-09-29 DIAGNOSIS — Z23 Encounter for immunization: Secondary | ICD-10-CM | POA: Insufficient documentation

## 2019-09-29 NOTE — Progress Notes (Signed)
   Covid-19 Vaccination Clinic  Name:  Erik Jackson    MRN: [id number] DOB: 02/09/1931  09/29/2019  [name] was observed post Covid-19 immunization for 15 minutes without incidence. He was provided with Vaccine Information Sheet and instruction to access the V-Safe system.   [name] was instructed to call 911 with any severe reactions post vaccine: Marland Kitchen Difficulty breathing  . Swelling of your face and throat  . A fast heartbeat  . A bad rash all over your body  . Dizziness and weakness    Immunizations Administered    Name Date Dose VIS Date Route   Pfizer COVID-19 Vaccine 09/29/2019 10:47 AM 0.3 mL 08/21/2019 Intramuscular   Manufacturer: [employer], [employer]   Lot: [id number]   NDC: [address]-2

## 2019-10-19 ENCOUNTER — Ambulatory Visit: Payer: Medicare Other | Attending: Internal Medicine

## 2019-10-19 DIAGNOSIS — Z23 Encounter for immunization: Secondary | ICD-10-CM

## 2019-10-19 NOTE — Progress Notes (Signed)
   Covid-19 Vaccination Clinic  Name:  Erik Jackson    MRN: [id number] DOB: 1930/12/23  10/19/2019  [name] was observed post Covid-19 immunization for 15 minutes without incidence. He was provided with Vaccine Information Sheet and instruction to access the V-Safe system.   [name] was instructed to call 911 with any severe reactions post vaccine: Marland Kitchen Difficulty breathing  . Swelling of your face and throat  . A fast heartbeat  . A bad rash all over your body  . Dizziness and weakness    Immunizations Administered    Name Date Dose VIS Date Route   Pfizer COVID-19 Vaccine 10/19/2019  1:04 PM 0.3 mL 08/21/2019 Intramuscular   Manufacturer: [employer], [employer]   Lot: [id number]   NDC: [address]-9

## 2019-11-02 MED FILL — ROSUVASTATIN CALCIUM 20 MG: 20 | 90 days supply | Qty: 90 | Fill #1

## 2019-11-02 MED FILL — LEVOTHYROXINE 75 MCG TABLET: 75 | 90 days supply | Qty: 90 | Fill #1

## 2019-11-02 MED FILL — DONEPEZIL HCL 5 MG TABLET: 5 | 90 days supply | Qty: 90 | Fill #2

## 2019-12-16 ENCOUNTER — Other Ambulatory Visit: Payer: Self-pay | Admitting: Internal Medicine

## 2019-12-16 MED FILL — LISINOPRIL 20 MG TABLET: 20 | 90 days supply | Qty: 90 | Fill #0

## 2019-12-16 MED FILL — HYDROCHLOROTHIAZIDE 12.5 MG: 12.5 | 90 days supply | Qty: 90 | Fill #0

## 2019-12-29 ENCOUNTER — Other Ambulatory Visit: Payer: Self-pay

## 2019-12-29 ENCOUNTER — Ambulatory Visit (INDEPENDENT_AMBULATORY_CARE_PROVIDER_SITE_OTHER): Payer: Medicare Other

## 2019-12-29 VITALS — BP 116/70 | HR 57 | Temp 98.4°F | Resp 16 | Ht 69.0 in | Wt 163.4 lb

## 2019-12-29 DIAGNOSIS — Z0000 Encounter for general adult medical examination without abnormal findings: Secondary | ICD-10-CM | POA: Diagnosis not present
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
## 2019-12-29 NOTE — Patient Instructions (Addendum)
[name] , Thank you for taking time to come for your Medicare Wellness Visit. I appreciate your ongoing commitment to your health goals. Please review the following plan we discussed and let me know if I can assist you in the future.   Screening recommendations/referrals: Colorectal Screening: 12/28/2009  Vision and Dental Exams: Recommended annual ophthalmology exams for early detection of glaucoma and other disorders of the eye Recommended annual dental exams for proper oral hygiene  Vaccinations: Influenza vaccine: 08/26/2019 Pneumococcal vaccine: completed; last done on 09/28/2009, 12/01/2013 Tdap vaccine: 07/14/2016; Due every 10 years. Shingles vaccine: Please call your insurance company to determine your out of pocket expense for the Shingrix vaccine. You may receive this vaccine at your local pharmacy. Covid vaccine: Pfizer 09/29/2019, 10/19/2019  Advanced directives: Advance directives discussed with you today. You have declined to receive documents for completion.  Goals:  Recommend to drink at least 6-8 8oz glasses of water per day.  Recommend to exercise for at least 150 minutes per week.  Recommend to remove any items from the home that may cause slips or trips.  Recommend to decrease portion sizes by eating 3 small healthy meals and at least 2 healthy snacks per day.  Recommend to begin DASH diet as directed below  Recommend to continue efforts to reduce smoking habits until no longer smoking. Smoking Cessation literature is attached below.  Next appointment: Please schedule your Annual Wellness Visit with your Nurse Health Advisor in one year.  Preventive Care 62 Years and Older, Male Preventive care refers to lifestyle choices and visits with your health care provider that can promote health and wellness. What does preventive care include?  A yearly physical exam. This is also called an annual well check.  Dental exams once or twice a year.  Routine eye exams. Ask  your health care provider how often you should have your eyes checked.  Personal lifestyle choices, including:  Daily care of your teeth and gums.  Regular physical activity.  Eating a healthy diet.  Avoiding tobacco and drug use.  Limiting alcohol use.  Practicing safe sex.  Taking low doses of aspirin every day if recommended by your health care provider..  Taking vitamin and mineral supplements as recommended by your health care provider. What happens during an annual well check? The services and screenings done by your health care provider during your annual well check will depend on your age, overall health, lifestyle risk factors, and family history of disease. Counseling  Your health care provider may ask you questions about your:  Alcohol use.  Tobacco use.  Drug use.  Emotional well-being.  Home and relationship well-being.  Sexual activity.  Eating habits.  History of falls.  Memory and ability to understand (cognition).  Work and work Astronomer. Screening  You may have the following tests or measurements:  Height, weight, and BMI.  Blood pressure.  Lipid and cholesterol levels. These may be checked every 5 years, or more frequently if you are over 63 years old.  Skin check.  Lung cancer screening. You may have this screening every year starting at age 68 if you have a 30-pack-year history of smoking and currently smoke or have quit within the past 15 years.  Fecal occult blood test (FOBT) of the stool. You may have this test every year starting at age 45.  Flexible sigmoidoscopy or colonoscopy. You may have a sigmoidoscopy every 5 years or a colonoscopy every 10 years starting at age 67.  Prostate cancer screening.  Recommendations will vary depending on your family history and other risks.  Hepatitis C blood test.  Hepatitis B blood test.  Sexually transmitted disease (STD) testing.  Diabetes screening. This is done by checking your  blood sugar (glucose) after you have not eaten for a while (fasting). You may have this done every 1-3 years.  Abdominal aortic aneurysm (AAA) screening. You may need this if you are a current or former smoker.  Osteoporosis. You may be screened starting at age 25 if you are at high risk. Talk with your health care provider about your test results, treatment options, and if necessary, the need for more tests. Vaccines  Your health care provider may recommend certain vaccines, such as:  Influenza vaccine. This is recommended every year.  Tetanus, diphtheria, and acellular pertussis (Tdap, Td) vaccine. You may need a Td booster every 10 years.  Zoster vaccine. You may need this after age 98.  Pneumococcal 13-valent conjugate (PCV13) vaccine. One dose is recommended after age 44.  Pneumococcal polysaccharide (PPSV23) vaccine. One dose is recommended after age 52. Talk to your health care provider about which screenings and vaccines you need and how often you need them. This information is not intended to replace advice given to you by your health care provider. Make sure you discuss any questions you have with your health care provider. Document Released: 09/23/2015 Document Revised: 05/16/2016 Document Reviewed: 06/28/2015 Elsevier Interactive Patient Education  2017 [employer].  Fall Prevention in the Home Falls can cause injuries. They can happen to people of all ages. There are many things you can do to make your home safe and to help prevent falls. What can I do on the outside of my home?  Regularly fix the edges of walkways and driveways and fix any cracks.  Remove anything that might make you trip as you walk through a door, such as a raised step or threshold.  Trim any bushes or trees on the path to your home.  Use bright outdoor lighting.  Clear any walking paths of anything that might make someone trip, such as rocks or tools.  Regularly check to see if handrails are  loose or broken. Make sure that both sides of any steps have handrails.  Any raised decks and porches should have guardrails on the edges.  Have any leaves, snow, or ice cleared regularly.  Use sand or salt on walking paths during winter.  Clean up any spills in your garage right away. This includes oil or grease spills. What can I do in the bathroom?  Use night lights.  Install grab bars by the toilet and in the tub and shower. Do not use towel bars as grab bars.  Use non-skid mats or decals in the tub or shower.  If you need to sit down in the shower, use a plastic, non-slip stool.  Keep the floor dry. Clean up any water that spills on the floor as soon as it happens.  Remove soap buildup in the tub or shower regularly.  Attach bath mats securely with double-sided non-slip rug tape.  Do not have throw rugs and other things on the floor that can make you trip. What can I do in the bedroom?  Use night lights.  Make sure that you have a light by your bed that is easy to reach.  Do not use any sheets or blankets that are too big for your bed. They should not hang down onto the floor.  Have a firm chair  that has side arms. You can use this for support while you get dressed.  Do not have throw rugs and other things on the floor that can make you trip. What can I do in the kitchen?  Clean up any spills right away.  Avoid walking on wet floors.  Keep items that you use a lot in easy-to-reach places.  If you need to reach something above you, use a strong step stool that has a grab bar.  Keep electrical cords out of the way.  Do not use floor polish or wax that makes floors slippery. If you must use wax, use non-skid floor wax.  Do not have throw rugs and other things on the floor that can make you trip. What can I do with my stairs?  Do not leave any items on the stairs.  Make sure that there are handrails on both sides of the stairs and use them. Fix handrails that  are broken or loose. Make sure that handrails are as long as the stairways.  Check any carpeting to make sure that it is firmly attached to the stairs. Fix any carpet that is loose or worn.  Avoid having throw rugs at the top or bottom of the stairs. If you do have throw rugs, attach them to the floor with carpet tape.  Make sure that you have a light switch at the top of the stairs and the bottom of the stairs. If you do not have them, ask someone to add them for you. What else can I do to help prevent falls?  Wear shoes that:  Do not have high heels.  Have rubber bottoms.  Are comfortable and fit you well.  Are closed at the toe. Do not wear sandals.  If you use a stepladder:  Make sure that it is fully opened. Do not climb a closed stepladder.  Make sure that both sides of the stepladder are locked into place.  Ask someone to hold it for you, if possible.  Clearly mark and make sure that you can see:  Any grab bars or handrails.  First and last steps.  Where the edge of each step is.  Use tools that help you move around (mobility aids) if they are needed. These include:  Canes.  Walkers.  Scooters.  Crutches.  Turn on the lights when you go into a dark area. Replace any light bulbs as soon as they burn out.  Set up your furniture so you have a clear path. Avoid moving your furniture around.  If any of your floors are uneven, fix them.  If there are any pets around you, be aware of where they are.  Review your medicines with your doctor. Some medicines can make you feel dizzy. This can increase your chance of falling. Ask your doctor what other things that you can do to help prevent falls. This information is not intended to replace advice given to you by your health care provider. Make sure you discuss any questions you have with your health care provider. Document Released: 06/23/2009 Document Revised: 02/02/2016 Document Reviewed: 10/01/2014 Elsevier  Interactive Patient Education  2017 [employer].

## 2020-01-07 ENCOUNTER — Other Ambulatory Visit: Payer: Self-pay

## 2020-01-07 ENCOUNTER — Ambulatory Visit (INDEPENDENT_AMBULATORY_CARE_PROVIDER_SITE_OTHER): Payer: Medicare Other | Admitting: Internal Medicine

## 2020-01-07 ENCOUNTER — Ambulatory Visit (INDEPENDENT_AMBULATORY_CARE_PROVIDER_SITE_OTHER): Payer: Medicare Other

## 2020-01-07 VITALS — BP 130/68 | HR 69 | Temp 99.0°F | Ht 69.0 in | Wt 164.0 lb

## 2020-01-07 DIAGNOSIS — R059 Cough, unspecified: Secondary | ICD-10-CM

## 2020-01-07 DIAGNOSIS — R05 Cough: Secondary | ICD-10-CM

## 2020-01-07 DIAGNOSIS — Z0000 Encounter for general adult medical examination without abnormal findings: Secondary | ICD-10-CM | POA: Diagnosis not present

## 2020-01-07 DIAGNOSIS — E039 Hypothyroidism, unspecified: Secondary | ICD-10-CM

## 2020-01-07 DIAGNOSIS — E785 Hyperlipidemia, unspecified: Secondary | ICD-10-CM

## 2020-01-07 DIAGNOSIS — F0390 Unspecified dementia without behavioral disturbance: Secondary | ICD-10-CM

## 2020-01-07 DIAGNOSIS — E538 Deficiency of other specified B group vitamins: Secondary | ICD-10-CM | POA: Diagnosis not present

## 2020-01-07 DIAGNOSIS — I10 Essential (primary) hypertension: Secondary | ICD-10-CM | POA: Diagnosis not present

## 2020-01-07 DIAGNOSIS — E559 Vitamin D deficiency, unspecified: Secondary | ICD-10-CM | POA: Diagnosis not present

## 2020-01-07 DIAGNOSIS — Z0001 Encounter for general adult medical examination with abnormal findings: Secondary | ICD-10-CM

## 2020-01-07 LAB — HEPATIC FUNCTION PANEL
ALT: 19 U/L (ref 0–53)
AST: 18 U/L (ref 0–37)
Albumin: 4.1 g/dL (ref 3.5–5.2)
Alkaline Phosphatase: 60 U/L (ref 39–117)
Bilirubin, Direct: 0.2 mg/dL (ref 0.0–0.3)
Total Bilirubin: 0.5 mg/dL (ref 0.2–1.2)
Total Protein: 7.6 g/dL (ref 6.0–8.3)

## 2020-01-07 LAB — BASIC METABOLIC PANEL
BUN: 22 mg/dL (ref 6–23)
CO2: 31 mEq/L (ref 19–32)
Calcium: 9.7 mg/dL (ref 8.4–10.5)
Chloride: 99 mEq/L (ref 96–112)
Creatinine, Ser: 0.91 mg/dL (ref 0.40–1.50)
GFR: 78.8 mL/min (ref >60.00)
Glucose, Bld: 95 mg/dL (ref 70–99)
Potassium: 4.1 mEq/L (ref 3.5–5.1)
Sodium: 138 mEq/L (ref 135–145)

## 2020-01-07 LAB — URINALYSIS, ROUTINE W REFLEX MICROSCOPIC
Bilirubin Urine: NEGATIVE
Hgb urine dipstick: NEGATIVE
Ketones, ur: NEGATIVE
Leukocytes,Ua: NEGATIVE
Nitrite: NEGATIVE
RBC / HPF: NONE SEEN (ref 0–?)
Specific Gravity, Urine: 1.02 (ref 1.000–1.030)
Total Protein, Urine: NEGATIVE
Urine Glucose: NEGATIVE
Urobilinogen, UA: 0.2 (ref 0.0–1.0)
WBC, UA: NONE SEEN (ref 0–?)
pH: 6 (ref 5.0–8.0)

## 2020-01-07 LAB — CBC WITH DIFFERENTIAL/PLATELET
Basophils Absolute: 0.1 10*3/uL (ref 0.0–0.1)
Basophils Relative: 0.7 % (ref 0.0–3.0)
Eosinophils Absolute: 0.2 10*3/uL (ref 0.0–0.7)
Eosinophils Relative: 2.9 % (ref 0.0–5.0)
HCT: 37.5 % — ABNORMAL LOW (ref 39.0–52.0)
Hemoglobin: 12.6 g/dL — ABNORMAL LOW (ref 13.0–17.0)
Lymphocytes Relative: 21.6 % (ref 12.0–46.0)
Lymphs Abs: 1.8 10*3/uL (ref 0.7–4.0)
MCHC: 33.6 g/dL (ref 30.0–36.0)
MCV: 96.7 fl (ref 78.0–100.0)
Monocytes Absolute: 0.8 10*3/uL (ref 0.1–1.0)
Monocytes Relative: 9.2 % (ref 3.0–12.0)
Neutro Abs: 5.4 10*3/uL (ref 1.4–7.7)
Neutrophils Relative %: 65.6 % (ref 43.0–77.0)
Platelets: 269 10*3/uL (ref 150.0–400.0)
RBC: 3.88 Mil/uL — ABNORMAL LOW (ref 4.22–5.81)
RDW: 14.7 % (ref 11.5–15.5)
WBC: 8.2 10*3/uL (ref 4.0–10.5)

## 2020-01-07 LAB — LIPID PANEL
Cholesterol: 139 mg/dL (ref 0–200)
HDL: 37.1 mg/dL — ABNORMAL LOW (ref >39.00)
LDL Cholesterol: 76 mg/dL (ref 0–99)
NonHDL: 101.9
Total CHOL/HDL Ratio: 4
Triglycerides: 128 mg/dL (ref 0.0–149.0)
VLDL: 25.6 mg/dL (ref 0.0–40.0)

## 2020-01-07 LAB — VITAMIN D 25 HYDROXY (VIT D DEFICIENCY, FRACTURES): VITD: 68.76 ng/mL (ref 30.00–100.00)

## 2020-01-07 LAB — VITAMIN B12: Vitamin B-12: >1500 pg/mL — ABNORMAL HIGH (ref 211–911)

## 2020-01-07 LAB — TSH: TSH: 1.94 u[IU]/mL (ref 0.35–4.50)

## 2020-01-07 MED ORDER — LEVOFLOXACIN 500 MG PO TABS
500.0000 mg | ORAL_TABLET | Freq: Every day | ORAL | 0 refills | Status: AC
Start: 2020-01-07 — End: 2020-01-17

## 2020-01-07 MED ORDER — CEFTRIAXONE SODIUM 1 G IJ SOLR
1.0000 g | Freq: Once | INTRAMUSCULAR | Status: AC
  Administered 2020-01-07: 1 g via INTRAMUSCULAR

## 2020-01-07 MED ORDER — HYDROCODONE-HOMATROPINE 5-1.5 MG/5ML PO SYRP: 5.0000 mL | ORAL_SOLUTION | Freq: Four times a day (QID) | ORAL | 0 refills | Status: DC | PRN

## 2020-01-07 MED FILL — HYDROCODONE-HOMATROPINE SOL: 5-1.5 | 9 days supply | Qty: 180 | Fill #0

## 2020-01-07 MED FILL — levoFLOXacin 500 MG TABS: 500 | 10 days supply | Qty: 10 | Fill #0

## 2020-01-07 NOTE — Patient Instructions (Signed)
You had the antibiotic shot today (rocephin)  Please take all new medication as prescribed - the pill antibiotic, and cough medicine as needed  Please continue all other medications as before, and refills have been done if requested.  Please have the pharmacy call with any other refills you may need.  Please continue your efforts at being more active, low cholesterol diet, and weight control.  You are otherwise up to date with prevention measures today.  Please keep your appointments with your specialists as you may have planned  Please go to the XRAY Department in the first floor for the x-ray testing  You will be contacted by phone if any changes need to be made immediately.  Otherwise, you will receive a letter about your results with an explanation, but please check with MyChart first.  Please remember to sign up for MyChart if you have not done so, as this will be important to you in the future with finding out test results, communicating by private email, and scheduling acute appointments online when needed.  Please make an Appointment to return in 6 months, or sooner if needed
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
## 2020-01-08 ENCOUNTER — Ambulatory Visit (INDEPENDENT_AMBULATORY_CARE_PROVIDER_SITE_OTHER)
Admission: RE | Admit: 2020-01-08 | Discharge: 2020-01-08 | Disposition: A | Discharge status: Home / Self Care | Payer: Medicare Other | Source: Ambulatory Visit | Attending: Internal Medicine | Admitting: Internal Medicine

## 2020-01-08 ENCOUNTER — Other Ambulatory Visit: Payer: Self-pay | Admitting: Internal Medicine

## 2020-01-08 ENCOUNTER — Encounter: Payer: Self-pay | Admitting: Internal Medicine

## 2020-01-08 DIAGNOSIS — R9389 Abnormal findings on diagnostic imaging of other specified body structures: Secondary | ICD-10-CM

## 2020-01-08 DIAGNOSIS — R918 Other nonspecific abnormal finding of lung field: Secondary | ICD-10-CM | POA: Diagnosis not present

## 2020-01-09 ENCOUNTER — Encounter: Payer: Self-pay | Admitting: Internal Medicine

## 2020-01-09 NOTE — Assessment & Plan Note (Signed)
stable overall by history and exam, recent data reviewed with pt, and pt to continue medical treatment as before,  to f/u any worsening symptoms or concerns  

## 2020-01-09 NOTE — Assessment & Plan Note (Signed)
No behavioral change, stable overall by history and exam, recent data reviewed with pt, and pt to continue medical treatment as before,  to f/u any worsening symptoms or concerns

## 2020-01-09 NOTE — Assessment & Plan Note (Addendum)
Mild to mod, c/w bronchitis vs pna, for cxr, for antibx course,  Cough med prn, to f/u any worsening symptoms or concerns  I spent 31 minutes in addition to time for CPX wellness examination in preparing to see the patient by review of recent labs, imaging and procedures, obtaining and reviewing separately obtained history, communicating with the patient and family or caregiver, ordering medications, tests or procedures, and documenting clinical information in the EHR including the differential Dx, treatment, and any further evaluation and other management of cough, b12 deficiency, hypothyroidism, htn, hld, dementia,

## 2020-01-09 NOTE — Assessment & Plan Note (Signed)
Cont oral replacement 

## 2020-01-09 NOTE — Assessment & Plan Note (Signed)

## 2020-02-05 MED FILL — LEVOTHYROXINE 75 MCG TABLET: 75 | 90 days supply | Qty: 90 | Fill #2

## 2020-02-05 MED FILL — ROSUVASTATIN CALCIUM 20 MG: 20 | 90 days supply | Qty: 90 | Fill #2

## 2020-02-24 ENCOUNTER — Other Ambulatory Visit: Payer: Self-pay

## 2020-02-24 ENCOUNTER — Encounter: Payer: Self-pay | Admitting: Internal Medicine

## 2020-02-24 ENCOUNTER — Ambulatory Visit (INDEPENDENT_AMBULATORY_CARE_PROVIDER_SITE_OTHER): Payer: Medicare Other | Admitting: Internal Medicine

## 2020-02-24 VITALS — BP 126/78 | HR 72 | Temp 98.5°F | Ht 69.0 in | Wt 162.0 lb

## 2020-02-24 DIAGNOSIS — E039 Hypothyroidism, unspecified: Secondary | ICD-10-CM | POA: Diagnosis not present

## 2020-02-24 DIAGNOSIS — Z23 Encounter for immunization: Secondary | ICD-10-CM

## 2020-02-24 DIAGNOSIS — F0390 Unspecified dementia without behavioral disturbance: Secondary | ICD-10-CM

## 2020-02-24 DIAGNOSIS — E785 Hyperlipidemia, unspecified: Secondary | ICD-10-CM

## 2020-02-24 DIAGNOSIS — I10 Essential (primary) hypertension: Secondary | ICD-10-CM | POA: Diagnosis not present

## 2020-02-24 NOTE — Assessment & Plan Note (Signed)
stable overall by history and exam, recent data reviewed with pt, and pt to continue medical treatment as before,  to f/u any worsening symptoms or concerns  

## 2020-02-24 NOTE — Patient Instructions (Signed)
You had the Pneumovax pneumonia shot today  Please continue all other medications as before, and refills have been done if requested.  Please have the pharmacy call with any other refills you may need.  Please continue your efforts at being more active, low cholesterol diet, and weight control.  Please keep your appointments with your specialists as you may have planned  Please make an Appointment to return in Jan 2022
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
## 2020-02-24 NOTE — Assessment & Plan Note (Addendum)
stable overall by history and exam, recent data reviewed with pt, and pt to continue medical treatment as before,  to f/u any worsening symptoms or concerns'  I spent 31 minutes in preparing to see the patient by review of recent labs, imaging and procedures, obtaining and reviewing separately obtained history, communicating with the patient and family or caregiver, ordering medications, tests or procedures, and documenting clinical information in the EHR including the differential Dx, treatment, and any further evaluation and other management of dementia, hypothyroidism, htn, hld

## 2020-03-04 ENCOUNTER — Telehealth: Payer: Self-pay | Admitting: Internal Medicine

## 2020-03-04 NOTE — Telephone Encounter (Signed)
Sorry, this is not a prescription medication, and is not generally recommended for memory loss

## 2020-03-04 NOTE — Telephone Encounter (Signed)
    Spouse calling to request Dr Jonny Ruiz prescribe Prevagen

## 2020-03-07 ENCOUNTER — Other Ambulatory Visit: Payer: Self-pay | Admitting: Internal Medicine

## 2020-03-07 MED ORDER — DONEPEZIL HCL 5 MG PO TABS
5.0000 mg | ORAL_TABLET | Freq: Every day | ORAL | 3 refills | Status: DC
Start: 2020-03-07 — End: 2020-03-07

## 2020-03-07 NOTE — Telephone Encounter (Signed)
Contacted pt spouse and I informed her of PCP response. She stated that she is looking for something prescribed to help him with his memory.   Please advise

## 2020-03-07 NOTE — Telephone Encounter (Signed)
Ok for aricept 5 qd - done erx

## 2020-03-08 MED FILL — DONEPEZIL HCL 5 MG TABLET: 5 | 90 days supply | Qty: 90 | Fill #0

## 2020-03-08 NOTE — Telephone Encounter (Signed)
Left detailed message for Erik Jackson informing that Dr. Jonny Ruiz has sent in a medication to help with pt memory.

## 2020-03-17 ENCOUNTER — Other Ambulatory Visit: Payer: Self-pay | Admitting: Internal Medicine

## 2020-03-17 NOTE — Telephone Encounter (Signed)
Please refill as per office routine med refill policy (all routine meds refilled for 3 mo or monthly per pt preference up to one year from last visit, then month to month grace period for 3 mo, then further med refills will have to be denied)  

## 2020-03-18 ENCOUNTER — Other Ambulatory Visit: Payer: Self-pay | Admitting: Internal Medicine

## 2020-03-18 MED FILL — HYDROCHLOROTHIAZIDE 12.5 MG: 12.5 | 90 days supply | Qty: 90 | Fill #0

## 2020-03-18 MED FILL — LISINOPRIL 20 MG TABLET: 20 | 90 days supply | Qty: 90 | Fill #0

## 2020-03-25 ENCOUNTER — Telehealth (INDEPENDENT_AMBULATORY_CARE_PROVIDER_SITE_OTHER): Payer: Medicare Other | Admitting: Internal Medicine

## 2020-03-25 DIAGNOSIS — F0390 Unspecified dementia without behavioral disturbance: Secondary | ICD-10-CM | POA: Diagnosis not present

## 2020-03-25 DIAGNOSIS — R05 Cough: Secondary | ICD-10-CM

## 2020-03-25 DIAGNOSIS — J309 Allergic rhinitis, unspecified: Secondary | ICD-10-CM

## 2020-03-25 DIAGNOSIS — R059 Cough, unspecified: Secondary | ICD-10-CM

## 2020-03-25 MED ORDER — HYDROCODONE-HOMATROPINE 5-1.5 MG/5ML PO SYRP: 5.0000 mL | ORAL_SOLUTION | Freq: Four times a day (QID) | ORAL | 0 refills | Status: AC | PRN

## 2020-03-25 MED ORDER — AZITHROMYCIN 250 MG PO TABS
ORAL_TABLET | ORAL | 0 refills | Status: DC
Start: 2020-03-25 — End: 2020-11-28

## 2020-03-25 MED FILL — AZITHROMYCIN 250 MG TABS: 250 | 5 days supply | Qty: 6 | Fill #0

## 2020-03-25 NOTE — Progress Notes (Deleted)
   Subjective:    Patient ID: Erik Jackson, male    DOB: 07/19/1931, 86 y.o.   MRN: [id number]  HPI   Here with acute onset mild to mod 2-3 days ST, HA, general weakness and malaise, with prod cough greenish sputum, but Pt denies chest pain, increased sob or doe, wheezing, orthopnea, PND, increased LE swelling, palpitations, dizziness or syncope. Does have several wks ongoing nasal allergy symptoms with clearish congestion, itch and sneezing, without fever, pain, ST, cough, swelling or wheezing.  Dementia overall stable symptomatically, and not assoc with behavioral changes such as hallucinations, paranoia, or agitation.  Review of Systems     Objective:   Physical Exam        Assessment & Plan:

## 2020-03-26 ENCOUNTER — Encounter: Payer: Self-pay | Admitting: Internal Medicine

## 2020-03-26 NOTE — Assessment & Plan Note (Signed)
stable overall by history and exam, recent data reviewed with pt, and pt to continue medical treatment as before,  to f/u any worsening symptoms or concerns  

## 2020-03-26 NOTE — Patient Instructions (Signed)
Please take all new medication as prescribed 

## 2020-03-26 NOTE — Assessment & Plan Note (Signed)
Mild to mod, to restart nasacort asd, to f/u any worsening symptoms or concerns 
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
## 2020-03-26 NOTE — Assessment & Plan Note (Addendum)
Mild to mod, c/w bronchitis vs pna, declines cxr, for antibx course, cough med prn,  to f/u any worsening symptoms or concerns  I spent 31 minutes in preparing to see the patient by review of recent labs, imaging and procedures, obtaining and reviewing separately obtained history, communicating with the patient and family or caregiver, ordering medications, tests or procedures, and documenting clinical information in the EHR including the differential Dx, treatment, and any further evaluation and other management of cough, allergies, dementia

## 2020-04-30 ENCOUNTER — Other Ambulatory Visit: Payer: Self-pay | Admitting: Internal Medicine

## 2020-04-30 NOTE — Telephone Encounter (Signed)
Please refill as per office routine med refill policy (all routine meds refilled for 3 mo or monthly per pt preference up to one year from last visit, then month to month grace period for 3 mo, then further med refills will have to be denied)  

## 2020-05-03 ENCOUNTER — Other Ambulatory Visit: Payer: Self-pay | Admitting: Internal Medicine

## 2020-05-03 MED FILL — ROSUVASTATIN CALCIUM 20 MG: 20 | 90 days supply | Qty: 90 | Fill #0

## 2020-05-03 MED FILL — LEVOTHYROXINE 75 MCG TABLET: 75 | 90 days supply | Qty: 90 | Fill #0

## 2020-06-11 MED FILL — LISINOPRIL 20 MG TABLET: 20 | 90 days supply | Qty: 90 | Fill #1

## 2020-06-11 MED FILL — HYDROCHLOROTHIAZIDE 12.5 MG: 12.5 | 90 days supply | Qty: 90 | Fill #1

## 2020-08-08 MED FILL — ROSUVASTATIN CALCIUM 20 MG: 20 | 90 days supply | Qty: 90 | Fill #1

## 2020-08-08 MED FILL — LEVOTHYROXINE 75 MCG TABLET: 75 | 90 days supply | Qty: 90 | Fill #1

## 2020-09-08 MED FILL — DONEPEZIL HCL 5 MG TABLET: 5 | 90 days supply | Qty: 90 | Fill #1

## 2020-09-08 MED FILL — LISINOPRIL 20 MG TABLET: 20 | 90 days supply | Qty: 90 | Fill #2

## 2020-09-08 MED FILL — HYDROCHLOROTHIAZIDE 12.5 MG: 12.5 | 90 days supply | Qty: 90 | Fill #2

## 2020-11-07 MED FILL — ROSUVASTATIN CALCIUM 20 MG: 20 | 90 days supply | Qty: 90 | Fill #2

## 2020-11-07 MED FILL — LEVOTHYROXINE 75 MCG TABLET: 75 | 90 days supply | Qty: 90 | Fill #2

## 2020-11-28 ENCOUNTER — Encounter: Payer: Self-pay | Admitting: Internal Medicine

## 2020-11-28 ENCOUNTER — Telehealth: Payer: Self-pay | Admitting: Internal Medicine

## 2020-11-28 ENCOUNTER — Other Ambulatory Visit: Payer: Self-pay | Admitting: Internal Medicine

## 2020-11-28 ENCOUNTER — Telehealth (INDEPENDENT_AMBULATORY_CARE_PROVIDER_SITE_OTHER): Payer: Medicare Other | Admitting: Internal Medicine

## 2020-11-28 DIAGNOSIS — R059 Cough, unspecified: Secondary | ICD-10-CM | POA: Diagnosis not present

## 2020-11-28 MED ORDER — BENZONATATE 100 MG PO CAPS: ORAL_CAPSULE | ORAL | 0 refills | Status: DC

## 2020-11-28 MED ORDER — AZITHROMYCIN 250 MG PO TABS: ORAL_TABLET | ORAL | 0 refills | Status: DC

## 2020-11-28 MED ORDER — HYDROCODONE-HOMATROPINE 5-1.5 MG/5ML PO SYRP: 5.0000 mL | ORAL_SOLUTION | Freq: Four times a day (QID) | ORAL | 0 refills | Status: DC | PRN

## 2020-11-28 MED FILL — BENZONATATE 100 MG CAPS: 100 | 10 days supply | Qty: 60 | Fill #0

## 2020-11-28 MED FILL — AZITHROMYCIN 250 MG TABLET: 250 | 5 days supply | Qty: 6 | Fill #0

## 2020-11-28 NOTE — Telephone Encounter (Signed)
Called number list on account and it constantly rang.

## 2020-11-28 NOTE — Progress Notes (Signed)
Virtual Visit via Telephone Note  I connected with Erik Jackson on 11/28/20 at  1:20 PM EDT by telephone and verified that I am speaking with the correct person using two identifiers.  Location: Patient: home Provider: [facility]  Son is present   I discussed the limitations, risks, security and privacy concerns of performing an evaluation and management service by telephone and the availability of in person appointments. I also discussed with the patient that there may be a patient responsible charge related to this service. The patient expressed understanding and agreed to proceed.   History of Present Illness:   The patient is hard hearing.  He is on the speaker phone.  His son, Erik Jackson, is present and helping to obtain history.  The patient has been suffering with mostly dry, sometimes productive cough of several days duration.  His Covid test was negative this morning.  There is no fever, chest pain.  He has leftovers of hydrocodone cough syrup that is helping.  His cough has been recurrent.  Observations/Objective: The patient sounds normal on the phone.  He is not dyspneic  Assessment and Plan: See plan  Follow Up Instructions:    I discussed the assessment and treatment plan with the patient. The patient was provided an opportunity to ask questions and all were answered. The patient agreed with the plan and demonstrated an understanding of the instructions.   The patient was advised to call back or seek an in-person evaluation if the symptoms worsen or if the condition fails to improve as anticipated.  I provided 15 minutes of non-face-to-face time during this encounter.   Sonda Primes, MD

## 2020-11-28 NOTE — Telephone Encounter (Signed)
[facility] Report/Call: aller states his dad is 85 years old and his dad takes some medications and wants to know what he can take to help clear up some congestion and coughing and complaining of a scratchy sore throat. Caller states his dad takes donapvil hydrochloride (?) which his son thinks may be for dementia. Caller says dad is not running a fever. Caller says his dad has both covid vaccines.  Advised HOME CARE: HOME CARE: * You should be able to treat this at home. COUGH MEDICINES: * HOME REMEDY - HONEY: This old home remedy has been shown to help decrease coughing at night. The adult dosage is 2 teaspoons (10 ml) at bedtime. CALL BACK IF: * You become worse CARE ADVICE given per Cough - Acute Productive (Adult) guideline

## 2020-11-28 NOTE — Assessment & Plan Note (Signed)
Likely acute bronchitis.  However, his cough seems to be recurrent.  I prescribed Hycodan cough syrup, Tessalon Perles, Z-Pak  I told the patient and his son that if he continues to have cough, they need to make an appointment to see Dr. Jonny Ruiz to discuss lisinopril.  [name] wife passed away and they have viewing later this afternoon.  I expressed my condolences.

## 2020-11-28 NOTE — Telephone Encounter (Signed)
Ok for tylenol as needed, or mucinex as needed - both available otc  He should avoid advil and alleve, as well as as sudafed as these can be harmful at his age and condition

## 2020-12-14 ENCOUNTER — Other Ambulatory Visit (HOSPITAL_COMMUNITY): Payer: Self-pay

## 2020-12-14 ENCOUNTER — Other Ambulatory Visit: Payer: Self-pay | Admitting: Internal Medicine

## 2020-12-14 MED ORDER — LISINOPRIL 20 MG PO TABS
20.0000 mg | ORAL_TABLET | Freq: Every day | ORAL | 0 refills | Status: DC
  Filled 2020-12-14: qty 30, 30d supply, fill #0

## 2020-12-14 MED ORDER — HYDROCHLOROTHIAZIDE 12.5 MG PO CAPS
12.5000 mg | ORAL_CAPSULE | Freq: Every day | ORAL | 0 refills | Status: DC
  Filled 2020-12-14: qty 30, 30d supply, fill #0

## 2020-12-14 MED FILL — Donepezil Hydrochloride Tab 5 MG: ORAL | 90 days supply | Qty: 90 | Fill #0 | Status: AC

## 2020-12-14 NOTE — Telephone Encounter (Signed)
Please refill as per office routine med refill policy (all routine meds refilled for 3 mo or monthly per pt preference up to one year from last visit, then month to month grace period for 3 mo, then further med refills will have to be denied)  

## 2020-12-14 NOTE — Telephone Encounter (Signed)
Left voicemail for patient to call office back in regards to some changes  Ok for tylenol as needed, or mucinex as needed - both available otc  He should avoid advil and alleve, as well as as sudafed as these can be harmful at his age and condition

## 2020-12-15 ENCOUNTER — Other Ambulatory Visit (HOSPITAL_COMMUNITY): Payer: Self-pay

## 2020-12-16 ENCOUNTER — Other Ambulatory Visit (HOSPITAL_COMMUNITY): Payer: Self-pay

## 2020-12-29 ENCOUNTER — Ambulatory Visit: Payer: Medicare Other

## 2021-01-06 ENCOUNTER — Other Ambulatory Visit (HOSPITAL_COMMUNITY): Payer: Self-pay

## 2021-01-06 ENCOUNTER — Other Ambulatory Visit: Payer: Self-pay | Admitting: Internal Medicine

## 2021-01-06 MED ORDER — HYDROCHLOROTHIAZIDE 12.5 MG PO CAPS
12.5000 mg | ORAL_CAPSULE | Freq: Every day | ORAL | 0 refills | Status: DC
  Filled 2021-01-06: qty 30, 30d supply, fill #0

## 2021-01-06 MED ORDER — LISINOPRIL 20 MG PO TABS
20.0000 mg | ORAL_TABLET | Freq: Every day | ORAL | 0 refills | Status: DC
  Filled 2021-01-06: qty 30, 30d supply, fill #0

## 2021-01-06 NOTE — Telephone Encounter (Signed)
Please refill as per office routine med refill policy (all routine meds refilled for 3 mo or monthly per pt preference up to one year from last visit, then month to month grace period for 3 mo, then further med refills will have to be denied)  

## 2021-01-23 ENCOUNTER — Encounter: Payer: Medicare Other | Admitting: Internal Medicine

## 2021-01-23 ENCOUNTER — Ambulatory Visit: Payer: Medicare Other

## 2021-01-30 ENCOUNTER — Other Ambulatory Visit: Payer: Self-pay

## 2021-01-30 ENCOUNTER — Encounter: Payer: Self-pay | Admitting: Internal Medicine

## 2021-01-30 ENCOUNTER — Ambulatory Visit (INDEPENDENT_AMBULATORY_CARE_PROVIDER_SITE_OTHER): Payer: Medicare Other | Admitting: Internal Medicine

## 2021-01-30 ENCOUNTER — Ambulatory Visit (INDEPENDENT_AMBULATORY_CARE_PROVIDER_SITE_OTHER): Payer: Medicare Other

## 2021-01-30 ENCOUNTER — Other Ambulatory Visit (HOSPITAL_COMMUNITY): Payer: Self-pay

## 2021-01-30 VITALS — BP 112/62 | HR 65 | Temp 98.6°F | Ht 69.0 in | Wt 164.0 lb

## 2021-01-30 DIAGNOSIS — F0390 Unspecified dementia without behavioral disturbance: Secondary | ICD-10-CM

## 2021-01-30 DIAGNOSIS — R739 Hyperglycemia, unspecified: Secondary | ICD-10-CM | POA: Diagnosis not present

## 2021-01-30 DIAGNOSIS — E559 Vitamin D deficiency, unspecified: Secondary | ICD-10-CM | POA: Diagnosis not present

## 2021-01-30 DIAGNOSIS — Z0000 Encounter for general adult medical examination without abnormal findings: Secondary | ICD-10-CM | POA: Diagnosis not present

## 2021-01-30 DIAGNOSIS — I10 Essential (primary) hypertension: Secondary | ICD-10-CM

## 2021-01-30 DIAGNOSIS — E538 Deficiency of other specified B group vitamins: Secondary | ICD-10-CM | POA: Diagnosis not present

## 2021-01-30 DIAGNOSIS — E039 Hypothyroidism, unspecified: Secondary | ICD-10-CM

## 2021-01-30 DIAGNOSIS — Z0001 Encounter for general adult medical examination with abnormal findings: Secondary | ICD-10-CM

## 2021-01-30 DIAGNOSIS — E7800 Pure hypercholesterolemia, unspecified: Secondary | ICD-10-CM

## 2021-01-30 DIAGNOSIS — R21 Rash and other nonspecific skin eruption: Secondary | ICD-10-CM

## 2021-01-30 LAB — CBC WITH DIFFERENTIAL/PLATELET
Basophils Absolute: 0.1 10*3/uL (ref 0.0–0.1)
Basophils Relative: 1.1 % (ref 0.0–3.0)
Eosinophils Absolute: 0.5 10*3/uL (ref 0.0–0.7)
Eosinophils Relative: 6.6 % — ABNORMAL HIGH (ref 0.0–5.0)
HCT: 35.8 % — ABNORMAL LOW (ref 39.0–52.0)
Hemoglobin: 12.1 g/dL — ABNORMAL LOW (ref 13.0–17.0)
Lymphocytes Relative: 33.3 % (ref 12.0–46.0)
Lymphs Abs: 2.4 10*3/uL (ref 0.7–4.0)
MCHC: 33.8 g/dL (ref 30.0–36.0)
MCV: 94.5 fl (ref 78.0–100.0)
Monocytes Absolute: 0.7 10*3/uL (ref 0.1–1.0)
Monocytes Relative: 9.2 % (ref 3.0–12.0)
Neutro Abs: 3.6 10*3/uL (ref 1.4–7.7)
Neutrophils Relative %: 49.8 % (ref 43.0–77.0)
Platelets: 281 10*3/uL (ref 150.0–400.0)
RBC: 3.79 Mil/uL — ABNORMAL LOW (ref 4.22–5.81)
RDW: 14.9 % (ref 11.5–15.5)
WBC: 7.2 10*3/uL (ref 4.0–10.5)

## 2021-01-30 LAB — HEMOGLOBIN A1C: Hgb A1c MFr Bld: 6.3 % (ref 4.6–6.5)

## 2021-01-30 LAB — URINALYSIS, ROUTINE W REFLEX MICROSCOPIC
Bilirubin Urine: NEGATIVE
Hgb urine dipstick: NEGATIVE
Ketones, ur: NEGATIVE
Leukocytes,Ua: NEGATIVE
Nitrite: NEGATIVE
RBC / HPF: NONE SEEN (ref 0–?)
Specific Gravity, Urine: >=1.03 — AB (ref 1.000–1.030)
Total Protein, Urine: NEGATIVE
Urine Glucose: NEGATIVE
Urobilinogen, UA: 0.2 (ref 0.0–1.0)
pH: 5.5 (ref 5.0–8.0)

## 2021-01-30 LAB — VITAMIN D 25 HYDROXY (VIT D DEFICIENCY, FRACTURES): VITD: 68.98 ng/mL (ref 30.00–100.00)

## 2021-01-30 LAB — TSH: TSH: 0.85 u[IU]/mL (ref 0.35–4.50)

## 2021-01-30 LAB — VITAMIN B12: Vitamin B-12: 361 pg/mL (ref 211–911)

## 2021-01-30 MED ORDER — HYDROCHLOROTHIAZIDE 12.5 MG PO CAPS
12.5000 mg | ORAL_CAPSULE | Freq: Every day | ORAL | 3 refills | Status: DC
  Filled 2021-01-30: qty 90, 90d supply, fill #0
  Filled 2021-05-10: qty 90, 90d supply, fill #1
  Filled 2021-08-11: qty 90, 90d supply, fill #2
  Filled 2021-11-29: qty 90, 90d supply, fill #3

## 2021-01-30 MED ORDER — LISINOPRIL 20 MG PO TABS
20.0000 mg | ORAL_TABLET | Freq: Every day | ORAL | 3 refills | Status: DC
  Filled 2021-01-30: qty 90, 90d supply, fill #0
  Filled 2021-05-10: qty 90, 90d supply, fill #1
  Filled 2021-08-11: qty 90, 90d supply, fill #2
  Filled 2021-11-29: qty 90, 90d supply, fill #3

## 2021-01-30 MED ORDER — ROSUVASTATIN CALCIUM 20 MG PO TABS
ORAL_TABLET | Freq: Every day | ORAL | 3 refills | Status: DC
  Filled 2021-01-30: qty 90, 90d supply, fill #0
  Filled 2021-05-10: qty 90, 90d supply, fill #1
  Filled 2021-08-11 – 2021-11-29 (×2): qty 90, 90d supply, fill #2

## 2021-01-30 MED ORDER — LEVOTHYROXINE SODIUM 75 MCG PO TABS
ORAL_TABLET | Freq: Every day | ORAL | 3 refills | Status: DC
  Filled 2021-01-30: qty 90, 90d supply, fill #0
  Filled 2021-05-10: qty 90, 90d supply, fill #1
  Filled 2021-08-11: qty 90, 90d supply, fill #2
  Filled 2021-11-01: qty 90, 90d supply, fill #3

## 2021-01-30 MED ORDER — DONEPEZIL HCL 5 MG PO TABS
ORAL_TABLET | Freq: Every day | ORAL | 3 refills | Status: DC
  Filled 2021-01-30 – 2021-03-17 (×2): qty 90, 90d supply, fill #0
  Filled 2021-06-14: qty 90, 90d supply, fill #1
  Filled 2021-08-31: qty 90, 90d supply, fill #2
  Filled 2021-11-29: qty 90, 90d supply, fill #3

## 2021-01-30 MED ORDER — TRIAMCINOLONE ACETONIDE 0.1 % EX CREA
1.0000 "application " | TOPICAL_CREAM | Freq: Two times a day (BID) | CUTANEOUS | 0 refills | Status: AC
  Filled 2021-01-30: qty 30, 15d supply, fill #0

## 2021-01-30 NOTE — Assessment & Plan Note (Signed)
Age and sex appropriate education and counseling updated with regular exercise and diet Referrals for preventative services - none needed Immunizations addressed - decines covid booster Smoking counseling  - none needed Evidence for depression or other mood disorder - none significant Most recent labs reviewed. I have personally reviewed and have noted: 1) the patient's medical and social history 2) The patient's current medications and supplements 3) The patient's height, weight, and BMI have been recorded in the chart  

## 2021-01-30 NOTE — Assessment & Plan Note (Signed)
Ok for triam cr prn,  to f/u any worsening symptoms or concerns 

## 2021-01-30 NOTE — Assessment & Plan Note (Signed)
Stable, cont current tx,  to f/u any worsening symptoms or concerns  

## 2021-01-30 NOTE — Assessment & Plan Note (Signed)
Lab Results  Component Value Date   TSH 1.94 01/07/2020   Stable, pt to continue levothyroxine

## 2021-01-30 NOTE — Patient Instructions (Signed)
Erik Jackson , Thank you for taking time to come for your Medicare Wellness Visit. I appreciate your ongoing commitment to your health goals. Please review the following plan we discussed and let me know if I can assist you in the future.   Screening recommendations/referrals: Colonoscopy: not a candidate for colon cancer screening due to age Recommended yearly ophthalmology/optometry visit for glaucoma screening and checkup Recommended yearly dental visit for hygiene and checkup  Vaccinations: Influenza vaccine: due Fall 2022 Pneumococcal vaccine: 12/01/2013, 02/24/2020 Tdap vaccine: 07/14/2016; due every 10 years Shingles vaccine: Please call your insurance company to determine your out of pocket expense for the Shingrix vaccine. You may receive this vaccine at your local pharmacy. Covid-19: 09/29/2019, 10/19/2019  Advanced directives: Advance directive discussed with you today. Even though you declined this today please call our office should you change your mind and we can give you the proper paperwork for you to fill out.  Conditions/risks identified:  Yes; Reviewed health maintenance screenings with patient today and relevant education, vaccines, and/or referrals were provided. Please continue to do your personal lifestyle choices by: daily care of teeth and gums, regular physical activity (goal should be 5 days a week for 30 minutes), eat a healthy diet, avoid tobacco and drug use, limiting any alcohol intake, taking a low-dose aspirin (if not allergic or have been advised by your provider otherwise) and taking vitamins and minerals as recommended by your provider. Continue doing brain stimulating activities (puzzles, reading, adult coloring books, staying active) to keep memory sharp. Continue to eat heart healthy diet (full of fruits, vegetables, whole grains, lean protein, water--limit salt, fat, and sugar intake) and increase physical activity as tolerated.  Next appointment: Please schedule  your next Medicare Wellness Visit with your Nurse Health Advisor in 1 year by calling [phone].  Preventive Care 8 Years and Older, Male Preventive care refers to lifestyle choices and visits with your health care provider that can promote health and wellness. What does preventive care include?  A yearly physical exam. This is also called an annual well check.  Dental exams once or twice a year.  Routine eye exams. Ask your health care provider how often you should have your eyes checked.  Personal lifestyle choices, including:  Daily care of your teeth and gums.  Regular physical activity.  Eating a healthy diet.  Avoiding tobacco and drug use.  Limiting alcohol use.  Practicing safe sex.  Taking low doses of aspirin every day.  Taking vitamin and mineral supplements as recommended by your health care provider. What happens during an annual well check? The services and screenings done by your health care provider during your annual well check will depend on your age, overall health, lifestyle risk factors, and family history of disease. Counseling  Your health care provider may ask you questions about your:  Alcohol use.  Tobacco use.  Drug use.  Emotional well-being.  Home and relationship well-being.  Sexual activity.  Eating habits.  History of falls.  Memory and ability to understand (cognition).  Work and work Astronomer. Screening  You may have the following tests or measurements:  Height, weight, and BMI.  Blood pressure.  Lipid and cholesterol levels. These may be checked every 5 years, or more frequently if you are over 28 years old.  Skin check.  Lung cancer screening. You may have this screening every year starting at age 46 if you have a 30-pack-year history of smoking and currently smoke or have quit within the past  15 years.  Fecal occult blood test (FOBT) of the stool. You may have this test every year starting at age  57.  Flexible sigmoidoscopy or colonoscopy. You may have a sigmoidoscopy every 5 years or a colonoscopy every 10 years starting at age 100.  Prostate cancer screening. Recommendations will vary depending on your family history and other risks.  Hepatitis C blood test.  Hepatitis B blood test.  Sexually transmitted disease (STD) testing.  Diabetes screening. This is done by checking your blood sugar (glucose) after you have not eaten for a while (fasting). You may have this done every 1-3 years.  Abdominal aortic aneurysm (AAA) screening. You may need this if you are a current or former smoker.  Osteoporosis. You may be screened starting at age 11 if you are at high risk. Talk with your health care provider about your test results, treatment options, and if necessary, the need for more tests. Vaccines  Your health care provider may recommend certain vaccines, such as:  Influenza vaccine. This is recommended every year.  Tetanus, diphtheria, and acellular pertussis (Tdap, Td) vaccine. You may need a Td booster every 10 years.  Zoster vaccine. You may need this after age 16.  Pneumococcal 13-valent conjugate (PCV13) vaccine. One dose is recommended after age 22.  Pneumococcal polysaccharide (PPSV23) vaccine. One dose is recommended after age 70. Talk to your health care provider about which screenings and vaccines you need and how often you need them. This information is not intended to replace advice given to you by your health care provider. Make sure you discuss any questions you have with your health care provider. Document Released: 09/23/2015 Document Revised: 05/16/2016 Document Reviewed: 06/28/2015 Elsevier Interactive Patient Education  2017 [employer].  Fall Prevention in the Home Falls can cause injuries. They can happen to people of all ages. There are many things you can do to make your home safe and to help prevent falls. What can I do on the outside of my  home?  Regularly fix the edges of walkways and driveways and fix any cracks.  Remove anything that might make you trip as you walk through a door, such as a raised step or threshold.  Trim any bushes or trees on the path to your home.  Use bright outdoor lighting.  Clear any walking paths of anything that might make someone trip, such as rocks or tools.  Regularly check to see if handrails are loose or broken. Make sure that both sides of any steps have handrails.  Any raised decks and porches should have guardrails on the edges.  Have any leaves, snow, or ice cleared regularly.  Use sand or salt on walking paths during winter.  Clean up any spills in your garage right away. This includes oil or grease spills. What can I do in the bathroom?  Use night lights.  Install grab bars by the toilet and in the tub and shower. Do not use towel bars as grab bars.  Use non-skid mats or decals in the tub or shower.  If you need to sit down in the shower, use a plastic, non-slip stool.  Keep the floor dry. Clean up any water that spills on the floor as soon as it happens.  Remove soap buildup in the tub or shower regularly.  Attach bath mats securely with double-sided non-slip rug tape.  Do not have throw rugs and other things on the floor that can make you trip. What can I do in the  bedroom?  Use night lights.  Make sure that you have a light by your bed that is easy to reach.  Do not use any sheets or blankets that are too big for your bed. They should not hang down onto the floor.  Have a firm chair that has side arms. You can use this for support while you get dressed.  Do not have throw rugs and other things on the floor that can make you trip. What can I do in the kitchen?  Clean up any spills right away.  Avoid walking on wet floors.  Keep items that you use a lot in easy-to-reach places.  If you need to reach something above you, use a strong step stool that has a  grab bar.  Keep electrical cords out of the way.  Do not use floor polish or wax that makes floors slippery. If you must use wax, use non-skid floor wax.  Do not have throw rugs and other things on the floor that can make you trip. What can I do with my stairs?  Do not leave any items on the stairs.  Make sure that there are handrails on both sides of the stairs and use them. Fix handrails that are broken or loose. Make sure that handrails are as long as the stairways.  Check any carpeting to make sure that it is firmly attached to the stairs. Fix any carpet that is loose or worn.  Avoid having throw rugs at the top or bottom of the stairs. If you do have throw rugs, attach them to the floor with carpet tape.  Make sure that you have a light switch at the top of the stairs and the bottom of the stairs. If you do not have them, ask someone to add them for you. What else can I do to help prevent falls?  Wear shoes that:  Do not have high heels.  Have rubber bottoms.  Are comfortable and fit you well.  Are closed at the toe. Do not wear sandals.  If you use a stepladder:  Make sure that it is fully opened. Do not climb a closed stepladder.  Make sure that both sides of the stepladder are locked into place.  Ask someone to hold it for you, if possible.  Clearly mark and make sure that you can see:  Any grab bars or handrails.  First and last steps.  Where the edge of each step is.  Use tools that help you move around (mobility aids) if they are needed. These include:  Canes.  Walkers.  Scooters.  Crutches.  Turn on the lights when you go into a dark area. Replace any light bulbs as soon as they burn out.  Set up your furniture so you have a clear path. Avoid moving your furniture around.  If any of your floors are uneven, fix them.  If there are any pets around you, be aware of where they are.  Review your medicines with your doctor. Some medicines can make  you feel dizzy. This can increase your chance of falling. Ask your doctor what other things that you can do to help prevent falls. This information is not intended to replace advice given to you by your health care provider. Make sure you discuss any questions you have with your health care provider. Document Released: 06/23/2009 Document Revised: 02/02/2016 Document Reviewed: 10/01/2014 Elsevier Interactive Patient Education  2017 [employer].
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
## 2021-01-30 NOTE — Assessment & Plan Note (Signed)
  Stable, pt to continue current medical treatment  - for a1c with labs

## 2021-01-30 NOTE — Assessment & Plan Note (Signed)
Lab Results  Component Value Date   VITAMINB12 >1500 (H) 01/07/2020   Stable, cont oral replacement - b12 1000 mcg qd

## 2021-01-30 NOTE — Assessment & Plan Note (Signed)
BP Readings from Last 3 Encounters:  01/30/21 112/62  01/30/21 112/62  02/24/20 126/78   Stable, pt to continue medical treatment zestril, hct
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
## 2021-01-30 NOTE — Assessment & Plan Note (Signed)
Lab Results  Component Value Date   LDLCALC 76 01/07/2020   Stable, pt to continue current statin crestor 20

## 2021-01-30 NOTE — Patient Instructions (Addendum)
Please take all new medication as prescribed - the cream for the rash  Please continue all other medications as before, and refills have been done if requested.  Please have the pharmacy call with any other refills you may need.  Please continue your efforts at being more active, low cholesterol diet, and weight control.  You are otherwise up to date with prevention measures today.  Please keep your appointments with your specialists as you may have planned  Please go to the LAB at the blood drawing area for the tests to be done  You will be contacted by phone if any changes need to be made immediately.  Otherwise, you will receive a letter about your results with an explanation, but please check with MyChart first.  Please remember to sign up for MyChart if you have not done so, as this will be important to you in the future with finding out test results, communicating by private email, and scheduling acute appointments online when needed.  Please make an Appointment to return in 6 months, or sooner if needed

## 2021-01-31 ENCOUNTER — Encounter: Payer: Self-pay | Admitting: Internal Medicine

## 2021-01-31 ENCOUNTER — Other Ambulatory Visit (HOSPITAL_COMMUNITY): Payer: Self-pay

## 2021-01-31 LAB — BASIC METABOLIC PANEL
BUN: 30 mg/dL — ABNORMAL HIGH (ref 6–23)
CO2: 26 mEq/L (ref 19–32)
Calcium: 9.7 mg/dL (ref 8.4–10.5)
Chloride: 102 mEq/L (ref 96–112)
Creatinine, Ser: 1.03 mg/dL (ref 0.40–1.50)
GFR: 65.02 mL/min (ref >60.00)
Glucose, Bld: 85 mg/dL (ref 70–99)
Potassium: 4.1 mEq/L (ref 3.5–5.1)
Sodium: 140 mEq/L (ref 135–145)

## 2021-01-31 LAB — LIPID PANEL
Cholesterol: 126 mg/dL (ref 0–200)
HDL: 33.9 mg/dL — ABNORMAL LOW (ref >39.00)
NonHDL: 92.32
Total CHOL/HDL Ratio: 4
Triglycerides: 292 mg/dL — ABNORMAL HIGH (ref 0.0–149.0)
VLDL: 58.4 mg/dL — ABNORMAL HIGH (ref 0.0–40.0)

## 2021-01-31 LAB — HEPATIC FUNCTION PANEL
ALT: 26 U/L (ref 0–53)
AST: 25 U/L (ref 0–37)
Albumin: 4.3 g/dL (ref 3.5–5.2)
Alkaline Phosphatase: 40 U/L (ref 39–117)
Bilirubin, Direct: 0 mg/dL (ref 0.0–0.3)
Total Bilirubin: 0.4 mg/dL (ref 0.2–1.2)
Total Protein: 7.2 g/dL (ref 6.0–8.3)

## 2021-01-31 LAB — LDL CHOLESTEROL, DIRECT: Direct LDL: 66 mg/dL

## 2021-02-02 ENCOUNTER — Other Ambulatory Visit (HOSPITAL_COMMUNITY): Payer: Self-pay

## 2021-03-17 ENCOUNTER — Other Ambulatory Visit (HOSPITAL_COMMUNITY): Payer: Self-pay

## 2021-05-10 ENCOUNTER — Other Ambulatory Visit (HOSPITAL_COMMUNITY): Payer: Self-pay

## 2021-05-13 ENCOUNTER — Other Ambulatory Visit (HOSPITAL_COMMUNITY): Payer: Self-pay

## 2021-05-13 DIAGNOSIS — R051 Acute cough: Secondary | ICD-10-CM | POA: Diagnosis not present

## 2021-05-13 DIAGNOSIS — U071 COVID-19: Secondary | ICD-10-CM | POA: Diagnosis not present

## 2021-05-13 DIAGNOSIS — R0602 Shortness of breath: Secondary | ICD-10-CM | POA: Diagnosis not present

## 2021-05-13 DIAGNOSIS — R062 Wheezing: Secondary | ICD-10-CM | POA: Diagnosis not present

## 2021-05-13 MED ORDER — PAXLOVID (300/100) 20 X 150 MG & 10 X 100MG PO TBPK
3.0000 | ORAL_TABLET | Freq: Two times a day (BID) | ORAL | 0 refills | Status: DC
  Filled 2021-05-13: qty 30, 5d supply, fill #0

## 2021-05-13 MED ORDER — PREDNISONE 10 MG PO TABS
ORAL_TABLET | ORAL | 0 refills | Status: DC
  Filled 2021-05-13: qty 40, 16d supply, fill #0

## 2021-05-13 MED ORDER — DOXYCYCLINE HYCLATE 100 MG PO TABS
100.0000 mg | ORAL_TABLET | Freq: Two times a day (BID) | ORAL | 0 refills | Status: DC
  Filled 2021-05-13: qty 20, 10d supply, fill #0

## 2021-05-13 MED ORDER — ALBUTEROL SULFATE (2.5 MG/3ML) 0.083% IN NEBU
INHALATION_SOLUTION | RESPIRATORY_TRACT | 0 refills | Status: DC
  Filled 2021-05-13: qty 90, 5d supply, fill #0

## 2021-06-08 ENCOUNTER — Telehealth: Payer: Self-pay | Admitting: Internal Medicine

## 2021-06-08 NOTE — Telephone Encounter (Signed)
   Patients son calling to report patient has not been taking statin since having covid and taking Paxlovid (9/3)  Requesting Rosuvastatin be discontinued or reduced. Son states the patient seems to feel better off statin. He has a better diet and less muscle discomfort   Please advise

## 2021-06-08 NOTE — Telephone Encounter (Signed)
Patient's son notified.

## 2021-06-08 NOTE — Telephone Encounter (Signed)
Ok to restart at every other day as this usually works for that situation, thanks

## 2021-06-15 ENCOUNTER — Other Ambulatory Visit (HOSPITAL_COMMUNITY): Payer: Self-pay

## 2021-06-16 ENCOUNTER — Other Ambulatory Visit (HOSPITAL_COMMUNITY): Payer: Self-pay

## 2021-08-11 ENCOUNTER — Other Ambulatory Visit (HOSPITAL_COMMUNITY): Payer: Self-pay

## 2021-08-12 ENCOUNTER — Other Ambulatory Visit (HOSPITAL_COMMUNITY): Payer: Self-pay

## 2021-09-01 ENCOUNTER — Other Ambulatory Visit (HOSPITAL_COMMUNITY): Payer: Self-pay

## 2021-09-05 ENCOUNTER — Other Ambulatory Visit (HOSPITAL_COMMUNITY): Payer: Self-pay

## 2021-11-03 ENCOUNTER — Other Ambulatory Visit (HOSPITAL_COMMUNITY): Payer: Self-pay

## 2021-11-30 ENCOUNTER — Other Ambulatory Visit (HOSPITAL_COMMUNITY): Payer: Self-pay

## 2021-12-01 ENCOUNTER — Other Ambulatory Visit (HOSPITAL_COMMUNITY): Payer: Self-pay

## 2022-01-24 ENCOUNTER — Telehealth: Payer: Self-pay | Admitting: Internal Medicine

## 2022-01-24 NOTE — Telephone Encounter (Signed)
LVM for pt to rtn my call to schedule AWV with [facility]. Please schedule if pt calls the office.  ?

## 2022-01-26 ENCOUNTER — Other Ambulatory Visit: Payer: Self-pay | Admitting: Internal Medicine

## 2022-01-26 NOTE — Telephone Encounter (Signed)
Please refill as per office routine med refill policy (all routine meds to be refilled for 3 mo or monthly (per pt preference) up to one year from last visit, then month to month grace period for 3 mo, then further med refills will have to be denied) ? ?

## 2022-01-30 ENCOUNTER — Ambulatory Visit: Payer: Medicare Other

## 2022-01-30 ENCOUNTER — Other Ambulatory Visit (HOSPITAL_COMMUNITY): Payer: Self-pay

## 2022-01-30 MED ORDER — LEVOTHYROXINE SODIUM 75 MCG PO TABS
ORAL_TABLET | Freq: Every day | ORAL | 3 refills | Status: DC
  Filled 2022-01-30: qty 90, 90d supply, fill #0

## 2022-01-31 ENCOUNTER — Ambulatory Visit (INDEPENDENT_AMBULATORY_CARE_PROVIDER_SITE_OTHER): Payer: Medicare Other

## 2022-01-31 ENCOUNTER — Other Ambulatory Visit (HOSPITAL_COMMUNITY): Payer: Self-pay

## 2022-01-31 ENCOUNTER — Ambulatory Visit (INDEPENDENT_AMBULATORY_CARE_PROVIDER_SITE_OTHER): Payer: Medicare Other | Admitting: Internal Medicine

## 2022-01-31 VITALS — BP 118/64 | HR 50 | Temp 98.1°F | Ht 69.0 in | Wt 160.0 lb

## 2022-01-31 DIAGNOSIS — L89121 Pressure ulcer of left upper back, stage 1: Secondary | ICD-10-CM

## 2022-01-31 DIAGNOSIS — I10 Essential (primary) hypertension: Secondary | ICD-10-CM

## 2022-01-31 DIAGNOSIS — E7800 Pure hypercholesterolemia, unspecified: Secondary | ICD-10-CM

## 2022-01-31 DIAGNOSIS — F03B0 Unspecified dementia, moderate, without behavioral disturbance, psychotic disturbance, mood disturbance, and anxiety: Secondary | ICD-10-CM

## 2022-01-31 DIAGNOSIS — R001 Bradycardia, unspecified: Secondary | ICD-10-CM | POA: Diagnosis not present

## 2022-01-31 DIAGNOSIS — E559 Vitamin D deficiency, unspecified: Secondary | ICD-10-CM | POA: Diagnosis not present

## 2022-01-31 DIAGNOSIS — R739 Hyperglycemia, unspecified: Secondary | ICD-10-CM | POA: Diagnosis not present

## 2022-01-31 DIAGNOSIS — Z0000 Encounter for general adult medical examination without abnormal findings: Secondary | ICD-10-CM

## 2022-01-31 DIAGNOSIS — E538 Deficiency of other specified B group vitamins: Secondary | ICD-10-CM

## 2022-01-31 DIAGNOSIS — E039 Hypothyroidism, unspecified: Secondary | ICD-10-CM

## 2022-01-31 DIAGNOSIS — Z0001 Encounter for general adult medical examination with abnormal findings: Secondary | ICD-10-CM | POA: Diagnosis not present

## 2022-01-31 LAB — CBC WITH DIFFERENTIAL/PLATELET
Basophils Absolute: 0.1 10*3/uL (ref 0.0–0.1)
Basophils Relative: 1 % (ref 0.0–3.0)
Eosinophils Absolute: 0.4 10*3/uL (ref 0.0–0.7)
Eosinophils Relative: 6.2 % — ABNORMAL HIGH (ref 0.0–5.0)
HCT: 38.3 % — ABNORMAL LOW (ref 39.0–52.0)
Hemoglobin: 12.8 g/dL — ABNORMAL LOW (ref 13.0–17.0)
Lymphocytes Relative: 36.6 % (ref 12.0–46.0)
Lymphs Abs: 2.3 10*3/uL (ref 0.7–4.0)
MCHC: 33.4 g/dL (ref 30.0–36.0)
MCV: 96.1 fl (ref 78.0–100.0)
Monocytes Absolute: 0.6 10*3/uL (ref 0.1–1.0)
Monocytes Relative: 8.8 % (ref 3.0–12.0)
Neutro Abs: 3 10*3/uL (ref 1.4–7.7)
Neutrophils Relative %: 47.4 % (ref 43.0–77.0)
Platelets: 262 10*3/uL (ref 150.0–400.0)
RBC: 3.99 Mil/uL — ABNORMAL LOW (ref 4.22–5.81)
RDW: 14.7 % (ref 11.5–15.5)
WBC: 6.3 10*3/uL (ref 4.0–10.5)

## 2022-01-31 LAB — HEPATIC FUNCTION PANEL
ALT: 31 U/L (ref 0–53)
AST: 27 U/L (ref 0–37)
Albumin: 4.4 g/dL (ref 3.5–5.2)
Alkaline Phosphatase: 46 U/L (ref 39–117)
Bilirubin, Direct: 0.1 mg/dL (ref 0.0–0.3)
Total Bilirubin: 0.5 mg/dL (ref 0.2–1.2)
Total Protein: 7.1 g/dL (ref 6.0–8.3)

## 2022-01-31 LAB — VITAMIN D 25 HYDROXY (VIT D DEFICIENCY, FRACTURES): VITD: 61.88 ng/mL (ref 30.00–100.00)

## 2022-01-31 LAB — BASIC METABOLIC PANEL
BUN: 18 mg/dL (ref 6–23)
CO2: 31 mEq/L (ref 19–32)
Calcium: 9.7 mg/dL (ref 8.4–10.5)
Chloride: 102 mEq/L (ref 96–112)
Creatinine, Ser: 0.94 mg/dL (ref 0.40–1.50)
GFR: 72.06 mL/min (ref >60.00)
Glucose, Bld: 88 mg/dL (ref 70–99)
Potassium: 3.8 mEq/L (ref 3.5–5.1)
Sodium: 141 mEq/L (ref 135–145)

## 2022-01-31 LAB — URINALYSIS, ROUTINE W REFLEX MICROSCOPIC
Bilirubin Urine: NEGATIVE
Hgb urine dipstick: NEGATIVE
Ketones, ur: NEGATIVE
Leukocytes,Ua: NEGATIVE
Nitrite: NEGATIVE
RBC / HPF: NONE SEEN (ref 0–?)
Specific Gravity, Urine: 1.025 (ref 1.000–1.030)
Total Protein, Urine: NEGATIVE
Urine Glucose: NEGATIVE
Urobilinogen, UA: 0.2 (ref 0.0–1.0)
pH: 6 (ref 5.0–8.0)

## 2022-01-31 LAB — HEMOGLOBIN A1C: Hgb A1c MFr Bld: 5.9 % (ref 4.6–6.5)

## 2022-01-31 LAB — VITAMIN B12: Vitamin B-12: 300 pg/mL (ref 211–911)

## 2022-01-31 LAB — LIPID PANEL
Cholesterol: 135 mg/dL (ref 0–200)
HDL: 37.8 mg/dL — ABNORMAL LOW (ref >39.00)
LDL Cholesterol: 61 mg/dL (ref 0–99)
NonHDL: 97.14
Total CHOL/HDL Ratio: 4
Triglycerides: 180 mg/dL — ABNORMAL HIGH (ref 0.0–149.0)
VLDL: 36 mg/dL (ref 0.0–40.0)

## 2022-01-31 LAB — TSH: TSH: 0.32 u[IU]/mL — ABNORMAL LOW (ref 0.35–5.50)

## 2022-01-31 MED ORDER — ROSUVASTATIN CALCIUM 20 MG PO TABS
ORAL_TABLET | Freq: Every day | ORAL | 3 refills | Status: DC
  Filled 2022-01-31: qty 90, fill #0
  Filled 2022-06-13: qty 90, 90d supply, fill #0

## 2022-01-31 MED ORDER — DONEPEZIL HCL 5 MG PO TABS
ORAL_TABLET | Freq: Every day | ORAL | 3 refills | Status: DC
  Filled 2022-01-31: qty 90, fill #0
  Filled 2022-02-26: qty 90, 90d supply, fill #0
  Filled 2022-05-21: qty 90, 90d supply, fill #1

## 2022-01-31 MED ORDER — LISINOPRIL 20 MG PO TABS
20.0000 mg | ORAL_TABLET | Freq: Every day | ORAL | 3 refills | Status: DC
  Filled 2022-01-31 – 2022-02-26 (×2): qty 90, 90d supply, fill #0
  Filled 2022-05-21: qty 90, 90d supply, fill #1

## 2022-01-31 MED ORDER — LEVOTHYROXINE SODIUM 75 MCG PO TABS
ORAL_TABLET | Freq: Every day | ORAL | 3 refills | Status: DC
  Filled 2022-01-31: qty 90, 90d supply, fill #0
  Filled 2022-04-27: qty 90, 90d supply, fill #1
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
## 2022-01-31 NOTE — Patient Instructions (Addendum)
Ok to stop the HCT fluid pill to avoid even mild dehydration and falling down  Please watch for any symptoms of lower Heart Rate such as dizziness, weakness, sob, or falling down; and you may want to buy an OTC Oximeter to check the HR when you want to  Please continue all other medications as before, and refills have been done if requested.  Please have the pharmacy call with any other refills you may need.  Please continue your efforts at being more active, low cholesterol diet, and weight control.  You are otherwise up to date with prevention measures today.  Please keep your appointments with your specialists as you may have planned  Please go to the LAB at the blood drawing area for the tests to be done  You will be contacted by phone if any changes need to be made immediately.  Otherwise, you will receive a letter about your results with an explanation, but please check with MyChart first.  Please remember to sign up for MyChart if you have not done so, as this will be important to you in the future with finding out test results, communicating by private email, and scheduling acute appointments online when needed.  Please make an Appointment to return for your 1 year visit, or sooner if needed, with Lab testing by Appointment as well, to be done about 3-5 days before at the [place] [facility] (so this is for TWO appointments - please see the scheduling desk as you leave)  Due to the ongoing Covid 19 pandemic, our lab now requires an appointment for any labs done at our office.  If you need labs done and do not have an appointment, please call our office ahead of time to schedule before presenting to the lab for your testing.

## 2022-01-31 NOTE — Assessment & Plan Note (Signed)
Lab Results  Component Value Date   VITAMINB12 361 01/30/2021   Stable, cont oral replacement - b12 1000 mcg qd
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
## 2022-01-31 NOTE — Patient Instructions (Signed)
Erik Jackson , Thank you for taking time to come for your Medicare Wellness Visit. I appreciate your ongoing commitment to your health goals. Please review the following plan we discussed and let me know if I can assist you in the future.   Screening recommendations/referrals: Colonoscopy: Not a candidate for screening due to age Recommended yearly ophthalmology/optometry visit for glaucoma screening and checkup Recommended yearly dental visit for hygiene and checkup  Vaccinations: Influenza vaccine: Due 04/10/2022 Pneumococcal vaccine: 12/01/2013, 02/24/2020 Tdap vaccine: 07/14/2016; due every 10 years Shingles vaccine: Never done   Covid-19: 09/29/2019, 10/19/2019  Advanced directives: No  Conditions/risks identified: Yes; Client understands the importance of follow-up appointments with providers by attending scheduled visits and discussed goals to eat healthier, increase physical activity 5 times a week for 30 minutes each, exercise the brain by doing stimulating brain exercises (reading, adult coloring, crafting, listening to music, puzzles, etc.), socialize and enjoy life more, get enough sleep at least 8-9 hours average per night and make time for laughter.  Next appointment: Please schedule your next Medicare Wellness Visit with your Nurse Health Advisor in 1 year by calling [phone].  Preventive Care 48 Years and Older, Male Preventive care refers to lifestyle choices and visits with your health care provider that can promote health and wellness. What does preventive care include? A yearly physical exam. This is also called an annual well check. Dental exams once or twice a year. Routine eye exams. Ask your health care provider how often you should have your eyes checked. Personal lifestyle choices, including: Daily care of your teeth and gums. Regular physical activity. Eating a healthy diet. Avoiding tobacco and drug use. Limiting alcohol use. Practicing safe sex. Taking low  doses of aspirin every day. Taking vitamin and mineral supplements as recommended by your health care provider. What happens during an annual well check? The services and screenings done by your health care provider during your annual well check will depend on your age, overall health, lifestyle risk factors, and family history of disease. Counseling  Your health care provider may ask you questions about your: Alcohol use. Tobacco use. Drug use. Emotional well-being. Home and relationship well-being. Sexual activity. Eating habits. History of falls. Memory and ability to understand (cognition). Work and work Statistician. Screening  You may have the following tests or measurements: Height, weight, and BMI. Blood pressure. Lipid and cholesterol levels. These may be checked every 5 years, or more frequently if you are over 50 years old. Skin check. Lung cancer screening. You may have this screening every year starting at age 19 if you have a 30-pack-year history of smoking and currently smoke or have quit within the past 15 years. Fecal occult blood test (FOBT) of the stool. You may have this test every year starting at age 107. Flexible sigmoidoscopy or colonoscopy. You may have a sigmoidoscopy every 5 years or a colonoscopy every 10 years starting at age 41. Prostate cancer screening. Recommendations will vary depending on your family history and other risks. Hepatitis C blood test. Hepatitis B blood test. Sexually transmitted disease (STD) testing. Diabetes screening. This is done by checking your blood sugar (glucose) after you have not eaten for a while (fasting). You may have this done every 1-3 years. Abdominal aortic aneurysm (AAA) screening. You may need this if you are a current or former smoker. Osteoporosis. You may be screened starting at age 75 if you are at high risk. Talk with your health care provider about your test results, treatment  options, and if necessary, the need  for more tests. Vaccines  Your health care provider may recommend certain vaccines, such as: Influenza vaccine. This is recommended every year. Tetanus, diphtheria, and acellular pertussis (Tdap, Td) vaccine. You may need a Td booster every 10 years. Zoster vaccine. You may need this after age 41. Pneumococcal 13-valent conjugate (PCV13) vaccine. One dose is recommended after age 54. Pneumococcal polysaccharide (PPSV23) vaccine. One dose is recommended after age 8. Talk to your health care provider about which screenings and vaccines you need and how often you need them. This information is not intended to replace advice given to you by your health care provider. Make sure you discuss any questions you have with your health care provider. Document Released: 09/23/2015 Document Revised: 05/16/2016 Document Reviewed: 06/28/2015 Elsevier Interactive Patient Education  2017 [place] Prevention in the Home Falls can cause injuries. They can happen to people of all ages. There are many things you can do to make your home safe and to help prevent falls. What can I do on the outside of my home? Regularly fix the edges of walkways and driveways and fix any cracks. Remove anything that might make you trip as you walk through a door, such as a raised step or threshold. Trim any bushes or trees on the path to your home. Use bright outdoor lighting. Clear any walking paths of anything that might make someone trip, such as rocks or tools. Regularly check to see if handrails are loose or broken. Make sure that both sides of any steps have handrails. Any raised decks and porches should have guardrails on the edges. Have any leaves, snow, or ice cleared regularly. Use sand or salt on walking paths during winter. Clean up any spills in your garage right away. This includes oil or grease spills. What can I do in the bathroom? Use night lights. Install grab bars by the toilet and in the tub and  shower. Do not use towel bars as grab bars. Use non-skid mats or decals in the tub or shower. If you need to sit down in the shower, use a plastic, non-slip stool. Keep the floor dry. Clean up any water that spills on the floor as soon as it happens. Remove soap buildup in the tub or shower regularly. Attach bath mats securely with double-sided non-slip rug tape. Do not have throw rugs and other things on the floor that can make you trip. What can I do in the bedroom? Use night lights. Make sure that you have a light by your bed that is easy to reach. Do not use any sheets or blankets that are too big for your bed. They should not hang down onto the floor. Have a firm chair that has side arms. You can use this for support while you get dressed. Do not have throw rugs and other things on the floor that can make you trip. What can I do in the kitchen? Clean up any spills right away. Avoid walking on wet floors. Keep items that you use a lot in easy-to-reach places. If you need to reach something above you, use a strong step stool that has a grab bar. Keep electrical cords out of the way. Do not use floor polish or wax that makes floors slippery. If you must use wax, use non-skid floor wax. Do not have throw rugs and other things on the floor that can make you trip. What can I do with my stairs? Do not  leave any items on the stairs. Make sure that there are handrails on both sides of the stairs and use them. Fix handrails that are broken or loose. Make sure that handrails are as long as the stairways. Check any carpeting to make sure that it is firmly attached to the stairs. Fix any carpet that is loose or worn. Avoid having throw rugs at the top or bottom of the stairs. If you do have throw rugs, attach them to the floor with carpet tape. Make sure that you have a light switch at the top of the stairs and the bottom of the stairs. If you do not have them, ask someone to add them for  you. What else can I do to help prevent falls? Wear shoes that: Do not have high heels. Have rubber bottoms. Are comfortable and fit you well. Are closed at the toe. Do not wear sandals. If you use a stepladder: Make sure that it is fully opened. Do not climb a closed stepladder. Make sure that both sides of the stepladder are locked into place. Ask someone to hold it for you, if possible. Clearly mark and make sure that you can see: Any grab bars or handrails. First and last steps. Where the edge of each step is. Use tools that help you move around (mobility aids) if they are needed. These include: Canes. Walkers. Scooters. Crutches. Turn on the lights when you go into a dark area. Replace any light bulbs as soon as they burn out. Set up your furniture so you have a clear path. Avoid moving your furniture around. If any of your floors are uneven, fix them. If there are any pets around you, be aware of where they are. Review your medicines with your doctor. Some medicines can make you feel dizzy. This can increase your chance of falling. Ask your doctor what other things that you can do to help prevent falls. This information is not intended to replace advice given to you by your health care provider. Make sure you discuss any questions you have with your health care provider. Document Released: 06/23/2009 Document Revised: 02/02/2016 Document Reviewed: 10/01/2014 Elsevier Interactive Patient Education  2017 [employer].

## 2022-02-04 ENCOUNTER — Encounter: Payer: Self-pay | Admitting: Internal Medicine

## 2022-02-04 DIAGNOSIS — L8990 Pressure ulcer of unspecified site, unspecified stage: Secondary | ICD-10-CM | POA: Insufficient documentation

## 2022-02-04 NOTE — Assessment & Plan Note (Signed)
Lab Results  Component Value Date   HGBA1C 5.9 01/31/2022   Stable, pt to continue current medical treatment  - diet

## 2022-02-04 NOTE — Assessment & Plan Note (Signed)
BP Readings from Last 3 Encounters:  01/31/22 118/64  01/31/22 118/64  01/30/21 112/62   Low normal with ongoing wt loss, pt to d/c hct to avoid low volume and falls

## 2022-02-04 NOTE — Assessment & Plan Note (Signed)
Lab Results  Component Value Date   LDLCALC 61 01/31/2022   Stable, pt to continue current statin crestor 20

## 2022-02-04 NOTE — Assessment & Plan Note (Signed)
Stable overall, cont aricept 5 for now

## 2022-02-04 NOTE — Assessment & Plan Note (Signed)
Lab Results  Component Value Date   TSH 0.32 (L) 01/31/2022   Stable, pt to continue levothyroxine

## 2022-02-04 NOTE — Assessment & Plan Note (Signed)

## 2022-02-04 NOTE — Assessment & Plan Note (Signed)
Mild already improved with minor sore persistent to left scapular area, to continue same wound care per family, avoid sleeping in recliner

## 2022-02-04 NOTE — Assessment & Plan Note (Signed)
Low normal, asympt, not on BB or CCB, continue to follow closely as both brother and father had pacemakers, advised son to have oximeter at hand at home to check HR

## 2022-02-26 ENCOUNTER — Other Ambulatory Visit (HOSPITAL_COMMUNITY): Payer: Self-pay

## 2022-02-27 ENCOUNTER — Other Ambulatory Visit (HOSPITAL_COMMUNITY): Payer: Self-pay

## 2022-02-28 ENCOUNTER — Other Ambulatory Visit (HOSPITAL_COMMUNITY): Payer: Self-pay

## 2022-03-05 ENCOUNTER — Ambulatory Visit: Payer: Medicare Other | Admitting: Internal Medicine

## 2022-04-28 ENCOUNTER — Other Ambulatory Visit (HOSPITAL_COMMUNITY): Payer: Self-pay

## 2022-05-10 ENCOUNTER — Ambulatory Visit: Payer: Self-pay

## 2022-05-10 NOTE — Patient Outreach (Signed)
  Care Coordination   05/10/2022 Name: Erik Jackson MRN: [id number] DOB: [birth year]   Care Coordination Outreach Attempts:  An unsuccessful telephone outreach was attempted today to offer the patient information about available care coordination services as a benefit of their health plan.   Follow Up Plan:  Additional outreach attempts will be made to offer the patient care coordination information and services.   Encounter Outcome:  No Answer  Care Coordination Interventions Activated:  No   Care Coordination Interventions:  No, not indicated    Kathyrn Sheriff, RN, MSN, BSN, CCM Care Coordinator [phone]

## 2022-05-17 ENCOUNTER — Telehealth: Payer: Self-pay

## 2022-05-17 NOTE — Telephone Encounter (Signed)
Pt son is requesting a Handicap placard for the pt.  Pt son states walking distance pt becomes very winded having to take breaks frequently to catch breath.  Please advise.  Please contact Tim [phone]

## 2022-05-22 ENCOUNTER — Telehealth: Payer: Self-pay

## 2022-05-22 ENCOUNTER — Other Ambulatory Visit (HOSPITAL_COMMUNITY): Payer: Self-pay

## 2022-05-22 NOTE — Patient Instructions (Signed)
Visit Information  Thank you for taking time to visit with me today. Please don't hesitate to contact me if I can be of assistance to you.   Following are the goals we discussed today:   Goals Addressed             This Visit's Progress    Obtain Handicap Placard       Care Coordination Interventions: Informed of basic process for obtaining placard Scheduled telephone call for 05/23/22         Our next appointment is by telephone on 05/23/22 at 2:00 pm  Please call the care guide team at [phone] if you need to cancel or reschedule your appointment.   If you are experiencing a Mental Health or Behavioral Health Crisis or need someone to talk to, please call the Suicide and Crisis Lifeline: 988  Patient verbalizes understanding of instructions and care plan provided today and agrees to view in MyChart. Active MyChart status and patient understanding of how to access instructions and care plan via MyChart confirmed with patient.     Kathyrn Sheriff, RN, MSN, BSN, CCM [facility] Care Coordinator [phone]

## 2022-05-22 NOTE — Patient Outreach (Signed)
  Care Coordination   Initial Visit Note   05/22/2022 Name: Erik Jackson MRN: [id number] DOB: April 06, 1931  Erik Jackson is a 88 y.o. year old male who sees Erik Levins, MD for primary care. I spoke with  son Erik Jackson(DPR) by phone today.  What matters to the patients health and wellness today?  Spoke with son Erik Loa, who states is at work and request call back to complete assessment. However, reports patient is in need of a Handicap Placard for car.   Goals Addressed             This Visit's Progress    Obtain Handicap Placard       Care Coordination Interventions: Informed of basic process for obtaining placard Scheduled telephone call for 05/23/22         SDOH assessments and interventions completed:  No     Care Coordination Interventions Activated:  Yes  Care Coordination Interventions:  Yes, provided   Follow up plan: Follow up call scheduled for 05/23/22    Encounter Outcome:  Pt. Visit Completed   Kathyrn Sheriff, RN, MSN, BSN, CCM [facility] Care Coordinator [phone]

## 2022-05-23 ENCOUNTER — Ambulatory Visit: Payer: Self-pay

## 2022-05-23 ENCOUNTER — Other Ambulatory Visit (HOSPITAL_COMMUNITY): Payer: Self-pay

## 2022-05-23 NOTE — Patient Outreach (Signed)
  Care Coordination   Initial Visit Note   05/23/2022 Name: Erik Jackson MRN: [id number] DOB: 1930/12/01  Erik Jackson is a 88 y.o. year old male who sees Corwin Levins, MD for primary care. I spoke with patient's son Erik Jackson(DPR) by phone today. Patient hard of hearing  What matters to the patients health and wellness today?  Per Patient's son Erik Jackson, patient lives alone. He is able to do own bath and small meal prep. He has very supportive family who takes turns checking in on patient and assisting with meals, medication management and grocery shopping and whatever is needed. Patient son reports that he knows the process for obtaining a Handicap Placard and will get the application and request primary care complete application and take back to the [facility] office. He denies any care coordination, disease management or resource needs at this time.   Goals Addressed             This Visit's Progress    COMPLETED: Care Coordination Activities-no follow up required       Care Coordination Interventions: Discussed Care Coordination services Medications reviewed. Encouraged to contact provider with any questions or concerns regarding medications Encouraged patient's son to contact care coordinator if care coordination needs change.      COMPLETED: Obtain Handicap Placard       Care Coordination Interventions: Informed of basic process for obtaining placard Scheduled telephone call for 05/23/22         SDOH assessments and interventions completed:  Yes  SDOH Interventions Today    Flowsheet Row Most Recent Value  SDOH Interventions   Food Insecurity Interventions Intervention Not Indicated  Housing Interventions Intervention Not Indicated  Transportation Interventions Intervention Not Indicated  Utilities Interventions Intervention Not Indicated        Care Coordination Interventions Activated:  Yes  Care Coordination Interventions:  Yes, provided   Follow up plan: No  further intervention required.   Encounter Outcome:  Pt. Visit Completed   Kathyrn Sheriff, RN, MSN, BSN, CCM [facility] Care Coordinator [phone]

## 2022-05-25 NOTE — Telephone Encounter (Signed)
NOTE NOT NEEDED ?

## 2022-05-25 NOTE — Telephone Encounter (Signed)
Form completed, unable to leave message to notify patient's son for pick up. Form placed at front office

## 2022-05-28 NOTE — Telephone Encounter (Signed)
[employer]oc[employer] notified for form completion

## 2022-05-30 ENCOUNTER — Other Ambulatory Visit (HOSPITAL_COMMUNITY): Payer: Self-pay

## 2022-05-30 DIAGNOSIS — R051 Acute cough: Secondary | ICD-10-CM | POA: Diagnosis not present

## 2022-05-30 DIAGNOSIS — Z23 Encounter for immunization: Secondary | ICD-10-CM | POA: Diagnosis not present

## 2022-05-30 DIAGNOSIS — Z20822 Contact with and (suspected) exposure to covid-19: Secondary | ICD-10-CM | POA: Diagnosis not present

## 2022-05-30 DIAGNOSIS — I10 Essential (primary) hypertension: Secondary | ICD-10-CM | POA: Diagnosis not present

## 2022-05-30 MED ORDER — BENZONATATE 200 MG PO CAPS
200.0000 mg | ORAL_CAPSULE | ORAL | 0 refills | Status: DC
  Filled 2022-05-30: qty 30, 10d supply, fill #0

## 2022-05-31 ENCOUNTER — Other Ambulatory Visit (HOSPITAL_COMMUNITY): Payer: Self-pay

## 2022-05-31 ENCOUNTER — Telehealth: Payer: Self-pay

## 2022-05-31 NOTE — Telephone Encounter (Signed)
Erik Jackson, patients son called in stating he was recently prescribed BENZONATATE 200 MG, wants to know if it is okay to take along with all of his other meds.

## 2022-05-31 NOTE — Telephone Encounter (Signed)
Patient and family updated.

## 2022-05-31 NOTE — Telephone Encounter (Signed)
Oh yes, no problem at all - ok to take

## 2022-05-31 NOTE — Telephone Encounter (Signed)
Please advise if it is ok for patient to take benzonatate along with his other meds.

## 2022-06-14 ENCOUNTER — Other Ambulatory Visit (HOSPITAL_COMMUNITY): Payer: Self-pay

## 2022-06-20 ENCOUNTER — Other Ambulatory Visit (HOSPITAL_COMMUNITY): Payer: Self-pay

## 2022-06-23 ENCOUNTER — Emergency Department (HOSPITAL_COMMUNITY): Payer: Medicare Other

## 2022-06-23 ENCOUNTER — Inpatient Hospital Stay (HOSPITAL_COMMUNITY)
Admission: EM | Admit: 2022-06-23 | Discharge: 2022-06-25 | DRG: 682 | Disposition: A | Discharge status: Home Health | Payer: Medicare Other | Attending: Internal Medicine | Admitting: Internal Medicine

## 2022-06-23 DIAGNOSIS — J45909 Unspecified asthma, uncomplicated: Secondary | ICD-10-CM | POA: Diagnosis present

## 2022-06-23 DIAGNOSIS — G9341 Metabolic encephalopathy: Secondary | ICD-10-CM | POA: Diagnosis not present

## 2022-06-23 DIAGNOSIS — F03A4 Unspecified dementia, mild, with anxiety: Secondary | ICD-10-CM | POA: Diagnosis not present

## 2022-06-23 DIAGNOSIS — Z888 Allergy status to other drugs, medicaments and biological substances status: Secondary | ICD-10-CM

## 2022-06-23 DIAGNOSIS — Z602 Problems related to living alone: Secondary | ICD-10-CM | POA: Diagnosis not present

## 2022-06-23 DIAGNOSIS — Z800 Family history of malignant neoplasm of digestive organs: Secondary | ICD-10-CM

## 2022-06-23 DIAGNOSIS — S0990XA Unspecified injury of head, initial encounter: Secondary | ICD-10-CM | POA: Diagnosis not present

## 2022-06-23 DIAGNOSIS — E785 Hyperlipidemia, unspecified: Secondary | ICD-10-CM | POA: Diagnosis not present

## 2022-06-23 DIAGNOSIS — K769 Liver disease, unspecified: Secondary | ICD-10-CM | POA: Diagnosis present

## 2022-06-23 DIAGNOSIS — Z981 Arthrodesis status: Secondary | ICD-10-CM | POA: Diagnosis not present

## 2022-06-23 DIAGNOSIS — T796XXA Traumatic ischemia of muscle, initial encounter: Secondary | ICD-10-CM | POA: Diagnosis present

## 2022-06-23 DIAGNOSIS — Z8719 Personal history of other diseases of the digestive system: Secondary | ICD-10-CM

## 2022-06-23 DIAGNOSIS — S5012XA Contusion of left forearm, initial encounter: Secondary | ICD-10-CM | POA: Diagnosis present

## 2022-06-23 DIAGNOSIS — E039 Hypothyroidism, unspecified: Secondary | ICD-10-CM | POA: Diagnosis present

## 2022-06-23 DIAGNOSIS — S80811A Abrasion, right lower leg, initial encounter: Secondary | ICD-10-CM | POA: Diagnosis present

## 2022-06-23 DIAGNOSIS — M6282 Rhabdomyolysis: Secondary | ICD-10-CM | POA: Diagnosis not present

## 2022-06-23 DIAGNOSIS — M25462 Effusion, left knee: Secondary | ICD-10-CM | POA: Diagnosis not present

## 2022-06-23 DIAGNOSIS — Z041 Encounter for examination and observation following transport accident: Secondary | ICD-10-CM | POA: Diagnosis not present

## 2022-06-23 DIAGNOSIS — Z79899 Other long term (current) drug therapy: Secondary | ICD-10-CM | POA: Diagnosis not present

## 2022-06-23 DIAGNOSIS — I7143 Infrarenal abdominal aortic aneurysm, without rupture: Secondary | ICD-10-CM | POA: Diagnosis present

## 2022-06-23 DIAGNOSIS — Z7982 Long term (current) use of aspirin: Secondary | ICD-10-CM | POA: Diagnosis not present

## 2022-06-23 DIAGNOSIS — F03B0 Unspecified dementia, moderate, without behavioral disturbance, psychotic disturbance, mood disturbance, and anxiety: Secondary | ICD-10-CM

## 2022-06-23 DIAGNOSIS — I700 Atherosclerosis of aorta: Secondary | ICD-10-CM | POA: Diagnosis not present

## 2022-06-23 DIAGNOSIS — Z885 Allergy status to narcotic agent status: Secondary | ICD-10-CM | POA: Diagnosis not present

## 2022-06-23 DIAGNOSIS — J439 Emphysema, unspecified: Secondary | ICD-10-CM | POA: Diagnosis not present

## 2022-06-23 DIAGNOSIS — R001 Bradycardia, unspecified: Secondary | ICD-10-CM | POA: Diagnosis not present

## 2022-06-23 DIAGNOSIS — R4182 Altered mental status, unspecified: Secondary | ICD-10-CM | POA: Diagnosis not present

## 2022-06-23 DIAGNOSIS — S299XXA Unspecified injury of thorax, initial encounter: Secondary | ICD-10-CM | POA: Diagnosis not present

## 2022-06-23 DIAGNOSIS — Z87891 Personal history of nicotine dependence: Secondary | ICD-10-CM

## 2022-06-23 DIAGNOSIS — E860 Dehydration: Secondary | ICD-10-CM | POA: Diagnosis present

## 2022-06-23 DIAGNOSIS — I2510 Atherosclerotic heart disease of native coronary artery without angina pectoris: Secondary | ICD-10-CM | POA: Diagnosis not present

## 2022-06-23 DIAGNOSIS — Z9079 Acquired absence of other genital organ(s): Secondary | ICD-10-CM

## 2022-06-23 DIAGNOSIS — I10 Essential (primary) hypertension: Secondary | ICD-10-CM | POA: Diagnosis not present

## 2022-06-23 DIAGNOSIS — G319 Degenerative disease of nervous system, unspecified: Secondary | ICD-10-CM | POA: Diagnosis not present

## 2022-06-23 DIAGNOSIS — M47812 Spondylosis without myelopathy or radiculopathy, cervical region: Secondary | ICD-10-CM | POA: Diagnosis not present

## 2022-06-23 DIAGNOSIS — Z974 Presence of external hearing-aid: Secondary | ICD-10-CM

## 2022-06-23 DIAGNOSIS — E872 Acidosis, unspecified: Secondary | ICD-10-CM | POA: Diagnosis not present

## 2022-06-23 DIAGNOSIS — E538 Deficiency of other specified B group vitamins: Secondary | ICD-10-CM | POA: Diagnosis present

## 2022-06-23 DIAGNOSIS — Z79890 Hormone replacement therapy (postmenopausal): Secondary | ICD-10-CM

## 2022-06-23 DIAGNOSIS — N179 Acute kidney failure, unspecified: Principal | ICD-10-CM | POA: Diagnosis present

## 2022-06-23 DIAGNOSIS — S199XXA Unspecified injury of neck, initial encounter: Secondary | ICD-10-CM | POA: Diagnosis not present

## 2022-06-23 DIAGNOSIS — Y92410 Unspecified street and highway as the place of occurrence of the external cause: Secondary | ICD-10-CM | POA: Diagnosis not present

## 2022-06-23 DIAGNOSIS — R911 Solitary pulmonary nodule: Secondary | ICD-10-CM | POA: Diagnosis not present

## 2022-06-23 DIAGNOSIS — M4322 Fusion of spine, cervical region: Secondary | ICD-10-CM | POA: Diagnosis not present

## 2022-06-23 DIAGNOSIS — L309 Dermatitis, unspecified: Secondary | ICD-10-CM | POA: Diagnosis present

## 2022-06-23 DIAGNOSIS — R918 Other nonspecific abnormal finding of lung field: Secondary | ICD-10-CM | POA: Diagnosis not present

## 2022-06-23 DIAGNOSIS — Z87448 Personal history of other diseases of urinary system: Secondary | ICD-10-CM

## 2022-06-23 DIAGNOSIS — S3991XA Unspecified injury of abdomen, initial encounter: Secondary | ICD-10-CM | POA: Diagnosis not present

## 2022-06-23 DIAGNOSIS — S80812A Abrasion, left lower leg, initial encounter: Secondary | ICD-10-CM | POA: Diagnosis present

## 2022-06-23 DIAGNOSIS — H9190 Unspecified hearing loss, unspecified ear: Secondary | ICD-10-CM | POA: Diagnosis present

## 2022-06-23 DIAGNOSIS — S3993XA Unspecified injury of pelvis, initial encounter: Secondary | ICD-10-CM | POA: Diagnosis not present

## 2022-06-23 DIAGNOSIS — R55 Syncope and collapse: Secondary | ICD-10-CM | POA: Diagnosis not present

## 2022-06-23 DIAGNOSIS — F0390 Unspecified dementia without behavioral disturbance: Secondary | ICD-10-CM | POA: Diagnosis present

## 2022-06-23 DIAGNOSIS — R351 Nocturia: Secondary | ICD-10-CM | POA: Diagnosis present

## 2022-06-23 DIAGNOSIS — Z96643 Presence of artificial hip joint, bilateral: Secondary | ICD-10-CM | POA: Diagnosis present

## 2022-06-23 DIAGNOSIS — M4802 Spinal stenosis, cervical region: Secondary | ICD-10-CM | POA: Diagnosis not present

## 2022-06-23 LAB — URINALYSIS, ROUTINE W REFLEX MICROSCOPIC
Bacteria, UA: NONE SEEN
Bilirubin Urine: NEGATIVE
Glucose, UA: NEGATIVE mg/dL
Ketones, ur: 20 mg/dL — AB
Leukocytes,Ua: NEGATIVE
Nitrite: NEGATIVE
Protein, ur: 30 mg/dL — AB
Specific Gravity, Urine: 1.031 — ABNORMAL HIGH (ref 1.005–1.030)
pH: 5 (ref 5.0–8.0)

## 2022-06-23 LAB — CBC
HCT: 37.9 % — ABNORMAL LOW (ref 39.0–52.0)
Hemoglobin: 12.7 g/dL — ABNORMAL LOW (ref 13.0–17.0)
MCH: 31.9 pg (ref 26.0–34.0)
MCHC: 33.5 g/dL (ref 30.0–36.0)
MCV: 95.2 fL (ref 80.0–100.0)
Platelets: 247 10*3/uL (ref 150–400)
RBC: 3.98 MIL/uL — ABNORMAL LOW (ref 4.22–5.81)
RDW: 15.8 % — ABNORMAL HIGH (ref 11.5–15.5)
WBC: 15.6 10*3/uL — ABNORMAL HIGH (ref 4.0–10.5)
nRBC: 0 % (ref 0.0–0.2)

## 2022-06-23 LAB — LACTIC ACID, PLASMA
Lactic Acid, Venous: 4.6 mmol/L (ref 0.5–1.9)
Lactic Acid, Venous: 1.4 mmol/L (ref 0.5–1.9)

## 2022-06-23 LAB — COMPREHENSIVE METABOLIC PANEL
ALT: 46 U/L — ABNORMAL HIGH (ref 0–44)
AST: 79 U/L — ABNORMAL HIGH (ref 15–41)
Albumin: 3.9 g/dL (ref 3.5–5.0)
Alkaline Phosphatase: 45 U/L (ref 38–126)
Anion gap: 18 — ABNORMAL HIGH (ref 5–15)
BUN: 33 mg/dL — ABNORMAL HIGH (ref 8–23)
CO2: 16 mmol/L — ABNORMAL LOW (ref 22–32)
Calcium: 9.5 mg/dL (ref 8.9–10.3)
Chloride: 107 mmol/L (ref 98–111)
Creatinine, Ser: 1.5 mg/dL — ABNORMAL HIGH (ref 0.61–1.24)
GFR, Estimated: 44 mL/min — ABNORMAL LOW (ref >60)
Glucose, Bld: 98 mg/dL (ref 70–99)
Potassium: 4.5 mmol/L (ref 3.5–5.1)
Sodium: 141 mmol/L (ref 135–145)
Total Bilirubin: 1 mg/dL (ref 0.3–1.2)
Total Protein: 6.5 g/dL (ref 6.5–8.1)

## 2022-06-23 LAB — PROTIME-INR
INR: 1.2 (ref 0.8–1.2)
Prothrombin Time: 14.7 seconds (ref 11.4–15.2)

## 2022-06-23 LAB — I-STAT CHEM 8, ED
BUN: 44 mg/dL — ABNORMAL HIGH (ref 8–23)
Calcium, Ion: 1.02 mmol/L — ABNORMAL LOW (ref 1.15–1.40)
Chloride: 109 mmol/L (ref 98–111)
Creatinine, Ser: 1.4 mg/dL — ABNORMAL HIGH (ref 0.61–1.24)
Glucose, Bld: 97 mg/dL (ref 70–99)
HCT: 37 % — ABNORMAL LOW (ref 39.0–52.0)
Hemoglobin: 12.6 g/dL — ABNORMAL LOW (ref 13.0–17.0)
Potassium: 4.4 mmol/L (ref 3.5–5.1)
Sodium: 140 mmol/L (ref 135–145)
TCO2: 21 mmol/L — ABNORMAL LOW (ref 22–32)

## 2022-06-23 LAB — SAMPLE TO BLOOD BANK

## 2022-06-23 LAB — ETHANOL: Alcohol, Ethyl (B): <10 mg/dL (ref <10)

## 2022-06-23 LAB — CK: Total CK: 1758 U/L — ABNORMAL HIGH (ref 49–397)

## 2022-06-23 MED ORDER — ACETAMINOPHEN 650 MG RE SUPP: 650.0000 mg | Freq: Four times a day (QID) | RECTAL | Status: DC | PRN

## 2022-06-23 MED ORDER — LACTATED RINGERS IV SOLN: INTRAVENOUS | Status: DC

## 2022-06-23 MED ORDER — ONDANSETRON HCL 4 MG PO TABS: 4.0000 mg | ORAL_TABLET | Freq: Four times a day (QID) | ORAL | Status: DC | PRN

## 2022-06-23 MED ORDER — LACTATED RINGERS IV BOLUS
500.0000 mL | Freq: Once | INTRAVENOUS | Status: AC
  Administered 2022-06-23: 500 mL via INTRAVENOUS

## 2022-06-23 MED ORDER — ONDANSETRON HCL 4 MG/2ML IJ SOLN: 4.0000 mg | Freq: Four times a day (QID) | INTRAMUSCULAR | Status: DC | PRN

## 2022-06-23 MED ORDER — IOHEXOL 350 MG/ML SOLN
75.0000 mL | Freq: Once | INTRAVENOUS | Status: AC | PRN
  Administered 2022-06-23: 75 mL via INTRAVENOUS

## 2022-06-23 MED ORDER — ASPIRIN 81 MG PO TBEC
81.0000 mg | DELAYED_RELEASE_TABLET | Freq: Every day | ORAL | Status: DC
  Administered 2022-06-24 – 2022-06-25 (×2): 81 mg via ORAL
  Filled 2022-06-23 (×2): qty 1

## 2022-06-23 MED ORDER — ENOXAPARIN SODIUM 30 MG/0.3ML IJ SOSY
30.0000 mg | PREFILLED_SYRINGE | INTRAMUSCULAR | Status: DC
  Administered 2022-06-24: 30 mg via SUBCUTANEOUS
  Filled 2022-06-23: qty 0.3

## 2022-06-23 MED ORDER — SODIUM CHLORIDE 0.9 % IV BOLUS
500.0000 mL | Freq: Once | INTRAVENOUS | Status: AC
  Administered 2022-06-23: 500 mL via INTRAVENOUS

## 2022-06-23 MED ORDER — TETANUS-DIPHTH-ACELL PERTUSSIS 5-2.5-18.5 LF-MCG/0.5 IM SUSY
0.5000 mL | PREFILLED_SYRINGE | Freq: Once | INTRAMUSCULAR | Status: DC
  Filled 2022-06-23: qty 0.5

## 2022-06-23 MED ORDER — DONEPEZIL HCL 10 MG PO TABS
5.0000 mg | ORAL_TABLET | Freq: Every day | ORAL | Status: DC
  Filled 2022-06-23: qty 1

## 2022-06-23 MED ORDER — ACETAMINOPHEN 325 MG PO TABS: 650.0000 mg | ORAL_TABLET | Freq: Four times a day (QID) | ORAL | Status: DC | PRN

## 2022-06-23 MED ORDER — DONEPEZIL HCL 5 MG PO TABS
5.0000 mg | ORAL_TABLET | Freq: Once | ORAL | Status: AC
  Administered 2022-06-23: 5 mg via ORAL
  Filled 2022-06-23: qty 1

## 2022-06-23 MED ORDER — ROSUVASTATIN CALCIUM 20 MG PO TABS
20.0000 mg | ORAL_TABLET | Freq: Every day | ORAL | Status: DC
  Administered 2022-06-24 – 2022-06-25 (×2): 20 mg via ORAL
  Filled 2022-06-23 (×2): qty 1

## 2022-06-23 MED ORDER — LEVOTHYROXINE SODIUM 75 MCG PO TABS
75.0000 ug | ORAL_TABLET | Freq: Every day | ORAL | Status: DC
  Administered 2022-06-24 – 2022-06-25 (×2): 75 ug via ORAL
  Filled 2022-06-23 (×2): qty 1

## 2022-06-23 MED ORDER — ADULT MULTIVITAMIN W/MINERALS CH
1.0000 | ORAL_TABLET | Freq: Every day | ORAL | Status: DC
  Administered 2022-06-24 – 2022-06-25 (×2): 1 via ORAL
  Filled 2022-06-23 (×2): qty 1

## 2022-06-23 NOTE — ED Notes (Signed)
Taken to CT by Estill Bamberg (Trauma RN) at this time.

## 2022-06-23 NOTE — Assessment & Plan Note (Signed)
Chronic bradycardia.  No BB nor CCB. Sounds like multiple family members ended up needing PPM for bradycardia at some point per PCP note from earlier this year. Will keep on tele monitor while here.

## 2022-06-23 NOTE — ED Notes (Signed)
Per MD assessment, injuries noted include abrasion to rt lateral foot, abrasion to rt shin, bilateral knee pressure injuries, rt posterior calf abrasion, and bruised left elbow.

## 2022-06-23 NOTE — ED Notes (Signed)
Notified Dr. Pearline Cables of Critical lactic 4.6

## 2022-06-23 NOTE — Assessment & Plan Note (Addendum)
Mild rhabdo with CPK 1758 IVF Repeat CPK in AM.
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
## 2022-06-23 NOTE — Assessment & Plan Note (Signed)
Cont home aricept
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
## 2022-06-23 NOTE — Assessment & Plan Note (Signed)
Hold lisinopril / HCTZ
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
## 2022-06-23 NOTE — ED Notes (Signed)
Patient transported to CT 
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
## 2022-06-24 ENCOUNTER — Encounter (HOSPITAL_COMMUNITY): Payer: Self-pay | Admitting: Internal Medicine

## 2022-06-24 DIAGNOSIS — Z888 Allergy status to other drugs, medicaments and biological substances status: Secondary | ICD-10-CM | POA: Diagnosis not present

## 2022-06-24 DIAGNOSIS — E872 Acidosis, unspecified: Secondary | ICD-10-CM | POA: Diagnosis present

## 2022-06-24 DIAGNOSIS — E538 Deficiency of other specified B group vitamins: Secondary | ICD-10-CM | POA: Diagnosis present

## 2022-06-24 DIAGNOSIS — Z87891 Personal history of nicotine dependence: Secondary | ICD-10-CM | POA: Diagnosis not present

## 2022-06-24 DIAGNOSIS — R911 Solitary pulmonary nodule: Secondary | ICD-10-CM | POA: Diagnosis present

## 2022-06-24 DIAGNOSIS — G9341 Metabolic encephalopathy: Secondary | ICD-10-CM | POA: Diagnosis present

## 2022-06-24 DIAGNOSIS — F03A4 Unspecified dementia, mild, with anxiety: Secondary | ICD-10-CM | POA: Diagnosis present

## 2022-06-24 DIAGNOSIS — Y92410 Unspecified street and highway as the place of occurrence of the external cause: Secondary | ICD-10-CM | POA: Diagnosis not present

## 2022-06-24 DIAGNOSIS — J45909 Unspecified asthma, uncomplicated: Secondary | ICD-10-CM | POA: Diagnosis present

## 2022-06-24 DIAGNOSIS — Z602 Problems related to living alone: Secondary | ICD-10-CM | POA: Diagnosis present

## 2022-06-24 DIAGNOSIS — R4182 Altered mental status, unspecified: Secondary | ICD-10-CM | POA: Diagnosis not present

## 2022-06-24 DIAGNOSIS — Z7982 Long term (current) use of aspirin: Secondary | ICD-10-CM | POA: Diagnosis not present

## 2022-06-24 DIAGNOSIS — Z79899 Other long term (current) drug therapy: Secondary | ICD-10-CM | POA: Diagnosis not present

## 2022-06-24 DIAGNOSIS — R001 Bradycardia, unspecified: Secondary | ICD-10-CM | POA: Diagnosis present

## 2022-06-24 DIAGNOSIS — L309 Dermatitis, unspecified: Secondary | ICD-10-CM | POA: Diagnosis present

## 2022-06-24 DIAGNOSIS — N179 Acute kidney failure, unspecified: Secondary | ICD-10-CM | POA: Diagnosis present

## 2022-06-24 DIAGNOSIS — K769 Liver disease, unspecified: Secondary | ICD-10-CM | POA: Diagnosis present

## 2022-06-24 DIAGNOSIS — Z79890 Hormone replacement therapy (postmenopausal): Secondary | ICD-10-CM | POA: Diagnosis not present

## 2022-06-24 DIAGNOSIS — E039 Hypothyroidism, unspecified: Secondary | ICD-10-CM | POA: Diagnosis present

## 2022-06-24 DIAGNOSIS — S0990XA Unspecified injury of head, initial encounter: Secondary | ICD-10-CM | POA: Diagnosis present

## 2022-06-24 DIAGNOSIS — I10 Essential (primary) hypertension: Secondary | ICD-10-CM | POA: Diagnosis present

## 2022-06-24 DIAGNOSIS — E785 Hyperlipidemia, unspecified: Secondary | ICD-10-CM | POA: Diagnosis present

## 2022-06-24 DIAGNOSIS — I7143 Infrarenal abdominal aortic aneurysm, without rupture: Secondary | ICD-10-CM | POA: Diagnosis present

## 2022-06-24 DIAGNOSIS — T796XXA Traumatic ischemia of muscle, initial encounter: Secondary | ICD-10-CM | POA: Diagnosis present

## 2022-06-24 DIAGNOSIS — E860 Dehydration: Secondary | ICD-10-CM | POA: Diagnosis present

## 2022-06-24 DIAGNOSIS — R55 Syncope and collapse: Secondary | ICD-10-CM | POA: Diagnosis not present

## 2022-06-24 DIAGNOSIS — Z885 Allergy status to narcotic agent status: Secondary | ICD-10-CM | POA: Diagnosis not present

## 2022-06-24 LAB — MAGNESIUM: Magnesium: 1.8 mg/dL (ref 1.7–2.4)

## 2022-06-24 LAB — CBC
HCT: 32.9 % — ABNORMAL LOW (ref 39.0–52.0)
Hemoglobin: 11.2 g/dL — ABNORMAL LOW (ref 13.0–17.0)
MCH: 32 pg (ref 26.0–34.0)
MCHC: 34 g/dL (ref 30.0–36.0)
MCV: 94 fL (ref 80.0–100.0)
Platelets: 210 10*3/uL (ref 150–400)
RBC: 3.5 MIL/uL — ABNORMAL LOW (ref 4.22–5.81)
RDW: 16.1 % — ABNORMAL HIGH (ref 11.5–15.5)
WBC: 11.2 10*3/uL — ABNORMAL HIGH (ref 4.0–10.5)
nRBC: 0 % (ref 0.0–0.2)

## 2022-06-24 LAB — BASIC METABOLIC PANEL
Anion gap: 10 (ref 5–15)
BUN: 30 mg/dL — ABNORMAL HIGH (ref 8–23)
CO2: 22 mmol/L (ref 22–32)
Calcium: 8.8 mg/dL — ABNORMAL LOW (ref 8.9–10.3)
Chloride: 108 mmol/L (ref 98–111)
Creatinine, Ser: 0.97 mg/dL (ref 0.61–1.24)
GFR, Estimated: >60 mL/min (ref >60)
Glucose, Bld: 75 mg/dL (ref 70–99)
Potassium: 3.9 mmol/L (ref 3.5–5.1)
Sodium: 140 mmol/L (ref 135–145)

## 2022-06-24 LAB — TSH: TSH: 1.634 u[IU]/mL (ref 0.350–4.500)

## 2022-06-24 LAB — PHOSPHORUS: Phosphorus: 4 mg/dL (ref 2.5–4.6)

## 2022-06-24 LAB — CK: Total CK: 2561 U/L — ABNORMAL HIGH (ref 49–397)

## 2022-06-24 MED ORDER — LACTATED RINGERS IV SOLN
INTRAVENOUS | Status: DC
  Administered 2022-06-24: 75 mL/h via INTRAVENOUS

## 2022-06-24 NOTE — ED Notes (Signed)
Pt walking with PT around unit.

## 2022-06-24 NOTE — ED Notes (Signed)
Pt eating lunch with son at bedside.

## 2022-06-24 NOTE — ED Notes (Signed)
Pt resting quietly with eyes closed

## 2022-06-24 NOTE — TOC CAGE-AID Note (Signed)
Transition of Care [facility]) - CAGE-AID Screening   Patient Details  Name: [name] MRN: [id number] Date of Birth: 08/12/1931  Clinical Narrative:  Patient denies any alcohol or drug use, no need for resources at this time.  CAGE-AID Screening:    Have You Ever Felt You Ought to Cut Down on Your Drinking or Drug Use?: No Have People Annoyed You By Critizing Your Drinking Or Drug Use?: No Have You Felt Bad Or Guilty About Your Drinking Or Drug Use?: No Have You Ever Had a Drink or Used Drugs First Thing In The Morning to Steady Your Nerves or to Get Rid of a Hangover?: No CAGE-AID Score: 0  Substance Abuse Education Offered: No
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
## 2022-06-24 NOTE — ED Notes (Signed)
Pt sitting on side of bed eating .

## 2022-06-24 NOTE — ED Notes (Signed)
Mitts removed for pt to eat.

## 2022-06-24 NOTE — Progress Notes (Signed)
Pt is in hallway.

## 2022-06-24 NOTE — Progress Notes (Signed)
Pt is still in hallway  

## 2022-06-24 NOTE — ED Notes (Signed)
Son at bedside.

## 2022-06-24 NOTE — ED Notes (Signed)
EEG called and reports they are unable to perform test since pt is in the hallway.
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
## 2022-06-24 NOTE — ED Notes (Signed)
Patient continues to attempt to remove IV and monitoring leads.  Unable to redirect at this time.  Mittens placed for patient safety.  Will continue to monitor

## 2022-06-24 NOTE — ED Notes (Signed)
Breakfast tray setup

## 2022-06-25 ENCOUNTER — Inpatient Hospital Stay (HOSPITAL_COMMUNITY)
Admit: 2022-06-25 | Discharge: 2022-06-25 | Disposition: A | Discharge status: Home / Self Care | Payer: Medicare Other | Attending: Internal Medicine | Admitting: Internal Medicine

## 2022-06-25 ENCOUNTER — Inpatient Hospital Stay (HOSPITAL_COMMUNITY): Payer: Medicare Other

## 2022-06-25 DIAGNOSIS — R4182 Altered mental status, unspecified: Secondary | ICD-10-CM | POA: Diagnosis not present

## 2022-06-25 DIAGNOSIS — R55 Syncope and collapse: Secondary | ICD-10-CM

## 2022-06-25 LAB — BASIC METABOLIC PANEL
Anion gap: 9 (ref 5–15)
BUN: 28 mg/dL — ABNORMAL HIGH (ref 8–23)
CO2: 24 mmol/L (ref 22–32)
Calcium: 8.8 mg/dL — ABNORMAL LOW (ref 8.9–10.3)
Chloride: 105 mmol/L (ref 98–111)
Creatinine, Ser: 0.95 mg/dL (ref 0.61–1.24)
GFR, Estimated: >60 mL/min (ref >60)
Glucose, Bld: 94 mg/dL (ref 70–99)
Potassium: 3.9 mmol/L (ref 3.5–5.1)
Sodium: 138 mmol/L (ref 135–145)

## 2022-06-25 LAB — CBC WITH DIFFERENTIAL/PLATELET
Abs Immature Granulocytes: 0.02 10*3/uL (ref 0.00–0.07)
Basophils Absolute: 0.1 10*3/uL (ref 0.0–0.1)
Basophils Relative: 1 %
Eosinophils Absolute: 0.2 10*3/uL (ref 0.0–0.5)
Eosinophils Relative: 2 %
HCT: 29.4 % — ABNORMAL LOW (ref 39.0–52.0)
Hemoglobin: 10.1 g/dL — ABNORMAL LOW (ref 13.0–17.0)
Immature Granulocytes: 0 %
Lymphocytes Relative: 18 %
Lymphs Abs: 1.5 10*3/uL (ref 0.7–4.0)
MCH: 31.6 pg (ref 26.0–34.0)
MCHC: 34.4 g/dL (ref 30.0–36.0)
MCV: 91.9 fL (ref 80.0–100.0)
Monocytes Absolute: 0.7 10*3/uL (ref 0.1–1.0)
Monocytes Relative: 8 %
Neutro Abs: 6 10*3/uL (ref 1.7–7.7)
Neutrophils Relative %: 71 %
Platelets: 193 10*3/uL (ref 150–400)
RBC: 3.2 MIL/uL — ABNORMAL LOW (ref 4.22–5.81)
RDW: 16 % — ABNORMAL HIGH (ref 11.5–15.5)
WBC: 8.4 10*3/uL (ref 4.0–10.5)
nRBC: 0 % (ref 0.0–0.2)

## 2022-06-25 LAB — ECHOCARDIOGRAM COMPLETE
Area-P 1/2: 3.45 cm2
Calc EF: 49.8 %
Height: 69 in
S' Lateral: 3.7 cm
Single Plane A2C EF: 52.3 %
Single Plane A4C EF: 49.6 %
Weight: 2666.68 oz

## 2022-06-25 LAB — BRAIN NATRIURETIC PEPTIDE: B Natriuretic Peptide: 521.9 pg/mL — ABNORMAL HIGH (ref 0.0–100.0)

## 2022-06-25 LAB — MAGNESIUM: Magnesium: 1.8 mg/dL (ref 1.7–2.4)

## 2022-06-25 LAB — CK: Total CK: 1671 U/L — ABNORMAL HIGH (ref 49–397)

## 2022-06-25 MED ORDER — LISINOPRIL 20 MG PO TABS: 20.0000 mg | ORAL_TABLET | Freq: Every day | ORAL | 0 refills | Status: DC

## 2022-06-25 NOTE — Progress Notes (Signed)
Pt off unit to echo.

## 2022-06-25 NOTE — Discharge Instructions (Signed)
Do not drive, operate heavy machinery, perform activities at heights, swimming or participation in water activities or provide baby sitting services  until you have seen by Primary MD or a Neurologist and advised to do so again.  Follow with Primary MD Biagio Borg, MD in 7 days   Get CBC, CMP, Magnesium, 2 view Chest X ray -  checked next visit within 1 week by Primary MD   Activity: As tolerated with Full fall precautions use walker/cane & assistance as needed  Disposition Home    Diet: Heart Healthy   Special Instructions: If you have smoked or chewed Tobacco  in the last 2 yrs please stop smoking, stop any regular Alcohol  and or any Recreational drug use.  On your next visit with your primary care physician please Get Medicines reviewed and adjusted.  Please request your Prim.MD to go over all Hospital Tests and Procedure/Radiological results at the follow up, please get all Hospital records sent to your Prim MD by signing hospital release before you go home.  If you experience worsening of your admission symptoms, develop shortness of breath, life threatening emergency, suicidal or homicidal thoughts you must seek medical attention immediately by calling 911 or calling your MD immediately  if symptoms less severe.  You Must read complete instructions/literature along with all the possible adverse reactions/side effects for all the Medicines you take and that have been prescribed to you. Take any new Medicines after you have completely understood and accpet all the possible adverse reactions/side effects.

## 2022-06-25 NOTE — TOC Progression Note (Signed)
Transition of Care [facility]) - Initial/Assessment Note    Patient Details  Name: Erik Jackson MRN: [id number] Date of Birth: Jul 19, 1931  Transition of Care [facility]) CM/SW Contact:    Milinda Antis, LCSWA Phone Number: 06/25/2022, 12:55 PM  Clinical Narrative:                 CSW informed by [facility] that the patient's son is requesting a phone call.  CSW contacted the patient's son and left Voice mails on both the mobile and work phone numbers.  TOC will continue to follow.          Patient Goals and CMS Choice        Expected Discharge Plan and Services           Expected Discharge Date: 06/25/22                                    Prior Living Arrangements/Services                       Activities of Daily Living Home Assistive Devices/Equipment: None ADL Screening (condition at time of admission) Patient's cognitive ability adequate to safely complete daily activities?: No Is the patient deaf or have difficulty hearing?: No Does the patient have difficulty seeing, even when wearing glasses/contacts?: No Does the patient have difficulty concentrating, remembering, or making decisions?: Yes Patient able to express need for assistance with ADLs?: Yes Does the patient have difficulty dressing or bathing?: No Independently performs ADLs?: Yes (appropriate for developmental age) Does the patient have difficulty walking or climbing stairs?: Yes Weakness of Legs: Both Weakness of Arms/Hands: Both  Permission Sought/Granted                  Emotional Assessment              Admission diagnosis:  AKI (acute kidney injury) ([place]) [N17.9] Closed head injury, initial encounter [S09.90XA] Traumatic rhabdomyolysis, initial encounter ([place]) [T79.6XXA] Altered mental status, unspecified altered mental status type [R41.82] Patient Active Problem List   Diagnosis Date Noted   Traumatic rhabdomyolysis ([place]) 06/23/2022   AKI (acute kidney injury) ([place])  06/23/2022   Liver lesion 08/67/6195   Acute metabolic encephalopathy 09/32/6712   Pressure sore 02/04/2022   Bradycardia 01/31/2022   Hyperglycemia 01/30/2021   Dementia ([place]) 05/13/2019   Cough 09/11/2018   B12 deficiency 02/14/2018   Unsteady gait 09/16/2017   Thrush 04/30/2017   Allergic rhinitis 07/22/2016   Primary osteoarthritis of left hip 02/03/2016   Vertigo 09/16/2015   Rib pain on left side 09/16/2015   Fall at home 09/16/2015   Enlarged prostate with urinary obstruction 05/19/2015   Nocturia 12/17/2014   Rash, skin 12/17/2014   Gout 12/01/2013   Encounter for well adult exam with abnormal findings 03/22/2011   Hyperlipidemia    Hypertension    Hypothyroidism    PCP:  Biagio Borg, MD Pharmacy:   [facility] [place] [address] Phone: [phone] Fax: [phone]     Social Determinants of Health (SDOH) Interventions    Readmission Risk Interventions     No data to display

## 2022-06-25 NOTE — Progress Notes (Signed)
DISCHARGE NOTE HOME MIRZA FESSEL to be discharged Home per MD order. Discussed prescriptions and follow up appointments with the patient. Prescriptions given to patient; medication list explained in detail. Patient verbalized understanding.  Skin clean, dry and intact without evidence of skin break down, no evidence of skin tears noted. IV catheter discontinued intact. Site without signs and symptoms of complications. Dressing and pressure applied. Pt denies pain at the site currently. No complaints noted.  Patient free of lines, drains, and wounds.   An After Visit Summary (AVS) was printed and given to the patient. Patient escorted via wheelchair, and discharged home via private auto.  Dreydon Cardenas S Karema Tocci, RN
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
## 2022-06-25 NOTE — Procedures (Signed)
Patient Name: Erik Jackson  MRN: [id number]  Epilepsy Attending: Lora Havens  Referring Physician/Provider: Thurnell Lose, MD  Date: 06/25/2022 Duration: 22.13 mins  Patient history: 88 year old male with altered mental status.  EEG Drolet for seizure.  Level of alertness: Awake  AEDs during EEG study: None  Technical aspects: This EEG study was done with scalp electrodes positioned according to the 10-20 International system of electrode placement. Electrical activity was reviewed with band pass filter of 1-70Hz , sensitivity of 7 uV/mm, display speed of 26mm/sec with a 60Hz  notched filter applied as appropriate. EEG data were recorded continuously and digitally stored.  Video monitoring was available and reviewed as appropriate.  Description: The posterior dominant rhythm consists of 8 Hz activity of moderate voltage (25-35 uV) seen predominantly in posterior head regions, symmetric and reactive to eye opening and eye closing. Hyperventilation and photic stimulation were not performed.     IMPRESSION: This study is within normal limits. No seizures or epileptiform discharges were seen throughout the recording.  A normal interictal EEG does not exclude the diagnosis of epilepsy.   Erik Jackson

## 2022-06-25 NOTE — Progress Notes (Signed)
OT Cancellation Note  Patient Details Name: Erik Jackson MRN: [id number] DOB: 04/13/1931   Cancelled Treatment:    Reason Eval/Treat Not Completed: Patient at procedure or test/ unavailable. Pt leaving for ECHO at this time. Will return at later time as schedule allows for evaluation.  [place], OTR/L Acute Rehab Services [facility] [phone] Office [phone]    Almon Register 06/25/2022, 8:12 AM

## 2022-06-25 NOTE — Progress Notes (Signed)
EEG complete - results pending 

## 2022-06-25 NOTE — Progress Notes (Signed)
Patient in ECHO will attempt EEG will schedule allows.
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
## 2022-06-26 ENCOUNTER — Telehealth: Payer: Self-pay

## 2022-06-26 NOTE — Telephone Encounter (Signed)
Transition Care Management Follow-up Telephone Call Date of discharge and from where: [facility] 06/25/2022 How have you been since you were released from the hospital? fine Any questions or concerns? Yes  Items Reviewed: Did the pt receive and understand the discharge instructions provided? Yes  Medications obtained and verified? Yes  Other? Yes  Any new allergies since your discharge? No  Dietary orders reviewed? Yes Do you have support at home? Yes   Home Care and Equipment/Supplies: Were home health services ordered? no If so, what is the name of the agency? N/a  Has the agency set up a time to come to the patient's home? not applicable Were any new equipment or medical supplies ordered?  No What is the name of the medical supply agency? N/a Were you able to get the supplies/equipment? not applicable Do you have any questions related to the use of the equipment or supplies? No  Functional Questionnaire: (I = Independent and D = Dependent) ADLs: I  Bathing/Dressing- I  Meal Prep- I  Eating- I  Maintaining continence- I  Transferring/Ambulation- I  Managing Meds- I  Follow up appointments reviewed:  PCP Hospital f/u appt confirmed? Yes  Scheduled to see Dr Jenny Reichmann on 07/03/2022 @ 9:40. [facility] f/u appt confirmed? No   Are transportation arrangements needed? No  If their condition worsens, is the pt aware to call PCP or go to the Emergency Dept.? Yes Was the patient provided with contact information for the PCP's office or ED? Yes Was to pt encouraged to call back with questions or concerns? Yes

## 2022-07-02 ENCOUNTER — Ambulatory Visit (INDEPENDENT_AMBULATORY_CARE_PROVIDER_SITE_OTHER): Payer: Medicare Other

## 2022-07-02 ENCOUNTER — Encounter: Payer: Self-pay | Admitting: Internal Medicine

## 2022-07-02 ENCOUNTER — Ambulatory Visit (INDEPENDENT_AMBULATORY_CARE_PROVIDER_SITE_OTHER): Payer: Medicare Other | Admitting: Internal Medicine

## 2022-07-02 ENCOUNTER — Other Ambulatory Visit (HOSPITAL_COMMUNITY): Payer: Self-pay

## 2022-07-02 VITALS — BP 122/66 | HR 74 | Temp 98.5°F | Ht 69.0 in | Wt 162.0 lb

## 2022-07-02 DIAGNOSIS — R600 Localized edema: Secondary | ICD-10-CM | POA: Insufficient documentation

## 2022-07-02 DIAGNOSIS — R160 Hepatomegaly, not elsewhere classified: Secondary | ICD-10-CM

## 2022-07-02 DIAGNOSIS — Z041 Encounter for examination and observation following transport accident: Secondary | ICD-10-CM | POA: Diagnosis not present

## 2022-07-02 DIAGNOSIS — F03B0 Unspecified dementia, moderate, without behavioral disturbance, psychotic disturbance, mood disturbance, and anxiety: Secondary | ICD-10-CM | POA: Diagnosis not present

## 2022-07-02 DIAGNOSIS — I714 Abdominal aortic aneurysm, without rupture, unspecified: Secondary | ICD-10-CM

## 2022-07-02 DIAGNOSIS — N179 Acute kidney failure, unspecified: Secondary | ICD-10-CM

## 2022-07-02 DIAGNOSIS — L03116 Cellulitis of left lower limb: Secondary | ICD-10-CM

## 2022-07-02 LAB — BASIC METABOLIC PANEL
BUN: 18 mg/dL (ref 6–23)
CO2: 28 mEq/L (ref 19–32)
Calcium: 9 mg/dL (ref 8.4–10.5)
Chloride: 103 mEq/L (ref 96–112)
Creatinine, Ser: 0.86 mg/dL (ref 0.40–1.50)
GFR: 76.74 mL/min (ref >60.00)
Glucose, Bld: 81 mg/dL (ref 70–99)
Potassium: 4.1 mEq/L (ref 3.5–5.1)
Sodium: 138 mEq/L (ref 135–145)

## 2022-07-02 LAB — HEPATIC FUNCTION PANEL
ALT: 36 U/L (ref 0–53)
AST: 28 U/L (ref 0–37)
Albumin: 3.8 g/dL (ref 3.5–5.2)
Alkaline Phosphatase: 74 U/L (ref 39–117)
Bilirubin, Direct: 0.1 mg/dL (ref 0.0–0.3)
Total Bilirubin: 0.4 mg/dL (ref 0.2–1.2)
Total Protein: 7.1 g/dL (ref 6.0–8.3)

## 2022-07-02 LAB — CBC WITH DIFFERENTIAL/PLATELET
Basophils Absolute: 0.1 10*3/uL (ref 0.0–0.1)
Basophils Relative: 1.2 % (ref 0.0–3.0)
Eosinophils Absolute: 0.5 10*3/uL (ref 0.0–0.7)
Eosinophils Relative: 8.3 % — ABNORMAL HIGH (ref 0.0–5.0)
HCT: 37.2 % — ABNORMAL LOW (ref 39.0–52.0)
Hemoglobin: 12.3 g/dL — ABNORMAL LOW (ref 13.0–17.0)
Lymphocytes Relative: 27.9 % (ref 12.0–46.0)
Lymphs Abs: 1.8 10*3/uL (ref 0.7–4.0)
MCHC: 33.1 g/dL (ref 30.0–36.0)
MCV: 94 fl (ref 78.0–100.0)
Monocytes Absolute: 0.7 10*3/uL (ref 0.1–1.0)
Monocytes Relative: 10.9 % (ref 3.0–12.0)
Neutro Abs: 3.3 10*3/uL (ref 1.4–7.7)
Neutrophils Relative %: 51.7 % (ref 43.0–77.0)
Platelets: 405 10*3/uL — ABNORMAL HIGH (ref 150.0–400.0)
RBC: 3.95 Mil/uL — ABNORMAL LOW (ref 4.22–5.81)
RDW: 16.2 % — ABNORMAL HIGH (ref 11.5–15.5)
WBC: 6.4 10*3/uL (ref 4.0–10.5)

## 2022-07-02 MED ORDER — FUROSEMIDE 20 MG PO TABS
20.0000 mg | ORAL_TABLET | Freq: Every day | ORAL | 0 refills | Status: DC
  Filled 2022-07-02: qty 2, 2d supply, fill #0

## 2022-07-02 MED ORDER — DOXYCYCLINE HYCLATE 100 MG PO TABS
100.0000 mg | ORAL_TABLET | Freq: Two times a day (BID) | ORAL | 0 refills | Status: DC
  Filled 2022-07-02: qty 20, 10d supply, fill #0

## 2022-07-02 NOTE — Patient Instructions (Signed)
Please take all new medication as prescribed - the antibiotic, and also the fluid pill for 2 days only (start tues or wed)  Please call if you would want to start Namenda for memory  Please continue all other medications as before, and refills have been done if requested.  Please have the pharmacy call with any other refills you may need.  Please continue your efforts at being more active, low cholesterol diet, and weight control.  You are otherwise up to date with prevention measures today.  Please keep your appointments with your specialists as you may have planned  You will be contacted regarding the referral for: MRI abdomen  Please go to the XRAY Department in the first floor for the x-ray testing  Please go to the LAB at the blood drawing area for the tests to be done  You will be contacted by phone if any changes need to be made immediately.  Otherwise, you will receive a letter about your results with an explanation, but please check with MyChart first.  Please remember to sign up for MyChart if you have not done so, as this will be important to you in the future with finding out test results, communicating by private email, and scheduling acute appointments online when needed.  Please make an Appointment to return in 3 months, or sooner if needed

## 2022-07-02 NOTE — Assessment & Plan Note (Signed)
aricept stopped at hospn due to possible cause of dizziness; I advised ok to start namenda but son wants to consider this at a later time
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
## 2022-07-02 NOTE — Assessment & Plan Note (Signed)
Incidental on CT - now for MRI w wo CM to further evaluate

## 2022-07-02 NOTE — Assessment & Plan Note (Signed)
Incidental, for f/u with MRI as above

## 2022-07-02 NOTE — Assessment & Plan Note (Signed)
Resolved at d/c but will need f/u lab

## 2022-07-02 NOTE — Assessment & Plan Note (Signed)
Mild to mod, for antibx course - doxycycline,  to f/u any worsening symptoms or concerns 

## 2022-07-02 NOTE — Assessment & Plan Note (Signed)
Mild, likely consequence of IVFs tx, for lasix 20 qd x 2 days only,  to f/u any worsening symptoms or concerns

## 2022-07-03 ENCOUNTER — Other Ambulatory Visit (HOSPITAL_COMMUNITY): Payer: Self-pay

## 2022-07-28 ENCOUNTER — Other Ambulatory Visit (HOSPITAL_COMMUNITY): Payer: Self-pay

## 2022-08-01 ENCOUNTER — Other Ambulatory Visit: Payer: Self-pay

## 2022-08-06 ENCOUNTER — Other Ambulatory Visit: Payer: Self-pay

## 2022-08-06 ENCOUNTER — Inpatient Hospital Stay (HOSPITAL_COMMUNITY)
Admission: EM | Admit: 2022-08-06 | Discharge: 2022-08-09 | DRG: 871 | Disposition: A | Discharge status: Home Health | Payer: Medicare Other | Source: Skilled Nursing Facility | Attending: Family Medicine | Admitting: Family Medicine

## 2022-08-06 ENCOUNTER — Emergency Department (HOSPITAL_COMMUNITY): Payer: Medicare Other

## 2022-08-06 ENCOUNTER — Encounter (HOSPITAL_COMMUNITY): Payer: Self-pay | Admitting: *Deleted

## 2022-08-06 DIAGNOSIS — J45909 Unspecified asthma, uncomplicated: Secondary | ICD-10-CM | POA: Diagnosis present

## 2022-08-06 DIAGNOSIS — K7689 Other specified diseases of liver: Secondary | ICD-10-CM | POA: Diagnosis present

## 2022-08-06 DIAGNOSIS — G9341 Metabolic encephalopathy: Secondary | ICD-10-CM | POA: Diagnosis not present

## 2022-08-06 DIAGNOSIS — R6520 Severe sepsis without septic shock: Secondary | ICD-10-CM | POA: Diagnosis present

## 2022-08-06 DIAGNOSIS — Z96643 Presence of artificial hip joint, bilateral: Secondary | ICD-10-CM | POA: Diagnosis present

## 2022-08-06 DIAGNOSIS — E876 Hypokalemia: Secondary | ICD-10-CM | POA: Diagnosis present

## 2022-08-06 DIAGNOSIS — F03A4 Unspecified dementia, mild, with anxiety: Secondary | ICD-10-CM | POA: Diagnosis present

## 2022-08-06 DIAGNOSIS — Z20822 Contact with and (suspected) exposure to covid-19: Secondary | ICD-10-CM | POA: Diagnosis present

## 2022-08-06 DIAGNOSIS — Z974 Presence of external hearing-aid: Secondary | ICD-10-CM | POA: Diagnosis not present

## 2022-08-06 DIAGNOSIS — E872 Acidosis, unspecified: Secondary | ICD-10-CM | POA: Diagnosis present

## 2022-08-06 DIAGNOSIS — E785 Hyperlipidemia, unspecified: Secondary | ICD-10-CM | POA: Diagnosis present

## 2022-08-06 DIAGNOSIS — A419 Sepsis, unspecified organism: Secondary | ICD-10-CM | POA: Diagnosis not present

## 2022-08-06 DIAGNOSIS — E039 Hypothyroidism, unspecified: Secondary | ICD-10-CM | POA: Diagnosis present

## 2022-08-06 DIAGNOSIS — Z885 Allergy status to narcotic agent status: Secondary | ICD-10-CM

## 2022-08-06 DIAGNOSIS — R509 Fever, unspecified: Secondary | ICD-10-CM | POA: Diagnosis not present

## 2022-08-06 DIAGNOSIS — Z87891 Personal history of nicotine dependence: Secondary | ICD-10-CM | POA: Diagnosis not present

## 2022-08-06 DIAGNOSIS — Z888 Allergy status to other drugs, medicaments and biological substances status: Secondary | ICD-10-CM

## 2022-08-06 DIAGNOSIS — L309 Dermatitis, unspecified: Secondary | ICD-10-CM | POA: Diagnosis present

## 2022-08-06 DIAGNOSIS — Z8719 Personal history of other diseases of the digestive system: Secondary | ICD-10-CM | POA: Diagnosis not present

## 2022-08-06 DIAGNOSIS — N179 Acute kidney failure, unspecified: Secondary | ICD-10-CM | POA: Diagnosis present

## 2022-08-06 DIAGNOSIS — R262 Difficulty in walking, not elsewhere classified: Secondary | ICD-10-CM | POA: Diagnosis present

## 2022-08-06 DIAGNOSIS — Z79899 Other long term (current) drug therapy: Secondary | ICD-10-CM

## 2022-08-06 DIAGNOSIS — Z7982 Long term (current) use of aspirin: Secondary | ICD-10-CM | POA: Diagnosis not present

## 2022-08-06 DIAGNOSIS — L0390 Cellulitis, unspecified: Secondary | ICD-10-CM | POA: Diagnosis not present

## 2022-08-06 DIAGNOSIS — I10 Essential (primary) hypertension: Secondary | ICD-10-CM | POA: Diagnosis present

## 2022-08-06 DIAGNOSIS — N289 Disorder of kidney and ureter, unspecified: Secondary | ICD-10-CM

## 2022-08-06 DIAGNOSIS — Z79890 Hormone replacement therapy (postmenopausal): Secondary | ICD-10-CM

## 2022-08-06 DIAGNOSIS — L03116 Cellulitis of left lower limb: Secondary | ICD-10-CM | POA: Diagnosis present

## 2022-08-06 DIAGNOSIS — R911 Solitary pulmonary nodule: Secondary | ICD-10-CM | POA: Diagnosis present

## 2022-08-06 LAB — URINALYSIS, MICROSCOPIC (REFLEX)

## 2022-08-06 LAB — URINALYSIS, ROUTINE W REFLEX MICROSCOPIC
Bilirubin Urine: NEGATIVE
Glucose, UA: NEGATIVE mg/dL
Ketones, ur: NEGATIVE mg/dL
Leukocytes,Ua: NEGATIVE
Nitrite: NEGATIVE
Protein, ur: 30 mg/dL — AB
Specific Gravity, Urine: 1.025 (ref 1.005–1.030)
pH: 5.5 (ref 5.0–8.0)

## 2022-08-06 LAB — PROTIME-INR
INR: 1.3 — ABNORMAL HIGH (ref 0.8–1.2)
Prothrombin Time: 15.8 seconds — ABNORMAL HIGH (ref 11.4–15.2)

## 2022-08-06 LAB — COMPREHENSIVE METABOLIC PANEL
ALT: 27 U/L (ref 0–44)
AST: 63 U/L — ABNORMAL HIGH (ref 15–41)
Albumin: 3.3 g/dL — ABNORMAL LOW (ref 3.5–5.0)
Alkaline Phosphatase: 63 U/L (ref 38–126)
Anion gap: 13 (ref 5–15)
BUN: 24 mg/dL — ABNORMAL HIGH (ref 8–23)
CO2: 21 mmol/L — ABNORMAL LOW (ref 22–32)
Calcium: 8.8 mg/dL — ABNORMAL LOW (ref 8.9–10.3)
Chloride: 102 mmol/L (ref 98–111)
Creatinine, Ser: 1.42 mg/dL — ABNORMAL HIGH (ref 0.61–1.24)
GFR, Estimated: 47 mL/min — ABNORMAL LOW (ref >60)
Glucose, Bld: 127 mg/dL — ABNORMAL HIGH (ref 70–99)
Potassium: 3.4 mmol/L — ABNORMAL LOW (ref 3.5–5.1)
Sodium: 136 mmol/L (ref 135–145)
Total Bilirubin: 1 mg/dL (ref 0.3–1.2)
Total Protein: 6.7 g/dL (ref 6.5–8.1)

## 2022-08-06 LAB — CBC WITH DIFFERENTIAL/PLATELET
Abs Immature Granulocytes: 0.16 10*3/uL — ABNORMAL HIGH (ref 0.00–0.07)
Basophils Absolute: 0 10*3/uL (ref 0.0–0.1)
Basophils Relative: 0 %
Eosinophils Absolute: 0.3 10*3/uL (ref 0.0–0.5)
Eosinophils Relative: 2 %
HCT: 36.5 % — ABNORMAL LOW (ref 39.0–52.0)
Hemoglobin: 12.3 g/dL — ABNORMAL LOW (ref 13.0–17.0)
Immature Granulocytes: 1 %
Lymphocytes Relative: 8 %
Lymphs Abs: 1.3 10*3/uL (ref 0.7–4.0)
MCH: 31.8 pg (ref 26.0–34.0)
MCHC: 33.7 g/dL (ref 30.0–36.0)
MCV: 94.3 fL (ref 80.0–100.0)
Monocytes Absolute: 0.8 10*3/uL (ref 0.1–1.0)
Monocytes Relative: 5 %
Neutro Abs: 13.5 10*3/uL — ABNORMAL HIGH (ref 1.7–7.7)
Neutrophils Relative %: 84 %
Platelets: 268 10*3/uL (ref 150–400)
RBC: 3.87 MIL/uL — ABNORMAL LOW (ref 4.22–5.81)
RDW: 15.6 % — ABNORMAL HIGH (ref 11.5–15.5)
WBC: 16.1 10*3/uL — ABNORMAL HIGH (ref 4.0–10.5)
nRBC: 0 % (ref 0.0–0.2)

## 2022-08-06 LAB — RESP PANEL BY RT-PCR (RSV, FLU A&B, COVID)  RVPGX2
Influenza A by PCR: NEGATIVE
Influenza B by PCR: NEGATIVE
Resp Syncytial Virus by PCR: NEGATIVE
SARS Coronavirus 2 by RT PCR: NEGATIVE

## 2022-08-06 LAB — LACTIC ACID, PLASMA
Lactic Acid, Venous: 2.2 mmol/L (ref 0.5–1.9)
Lactic Acid, Venous: 2.7 mmol/L (ref 0.5–1.9)

## 2022-08-06 LAB — APTT: aPTT: 29 seconds (ref 24–36)

## 2022-08-06 MED ORDER — SODIUM CHLORIDE 0.9 % IV SOLN
2.0000 g | Freq: Once | INTRAVENOUS | Status: AC
  Administered 2022-08-06: 2 g via INTRAVENOUS
  Filled 2022-08-06: qty 20

## 2022-08-06 MED ORDER — SODIUM CHLORIDE 0.9 % IV BOLUS: 1000.0000 mL | Freq: Once | INTRAVENOUS | Status: DC

## 2022-08-06 MED ORDER — ACETAMINOPHEN 325 MG PO TABS: 325.0000 mg | ORAL_TABLET | Freq: Once | ORAL | Status: DC

## 2022-08-06 MED ORDER — ACETAMINOPHEN 325 MG PO TABS
650.0000 mg | ORAL_TABLET | Freq: Once | ORAL | Status: AC
  Administered 2022-08-06: 650 mg via ORAL
  Filled 2022-08-06: qty 2

## 2022-08-06 MED ORDER — SODIUM CHLORIDE 0.9 % IV BOLUS
2500.0000 mL | Freq: Once | INTRAVENOUS | Status: AC
  Administered 2022-08-06: 2500 mL via INTRAVENOUS

## 2022-08-06 NOTE — ED Provider Notes (Signed)
[facility] EMERGENCY DEPARTMENT Provider Note   CSN: [id number] Arrival date & time: 08/06/22  1444     History  Chief Complaint  Patient presents with   Fever    Erik Jackson is a 88 y.o. male.   Fever  This patient is an 88 year old male, has dementia, takes lisinopril for blood pressure and rosuvastatin for cholesterol as well as Lasix.  He presents to the hospital today with a complaint of a fever, weakness, he has been unable to walk today, he had some incontinence last night when he fell asleep in the recliner and was found slumped over the recliner this morning.  Temperature on arrival was 102 degrees with a pulse that was tachycardic over 100 and a blood pressure that became hypotensive while waiting to come back to her room.  He was measured as low as 73/47.  There has been no vomiting, no urinary symptoms, no coughing or shortness of breath according to the family that accompanies him.  They report he started to feel poorly 2 days ago but it got much worse this morning    Home Medications Prior to Admission medications   Medication Sig Start Date End Date Taking? Authorizing Provider  aspirin 81 MG tablet Take 1 tablet (81 mg total) by mouth 2 (two) times daily after a meal. Patient taking differently: Take 81 mg by mouth daily. 02/03/16  Yes Swinteck, Aaron Edelman, MD  levothyroxine (SYNTHROID) 75 MCG tablet TAKE 1 TABLET BY MOUTH ONCE DAILY Patient taking differently: Take 75 mcg by mouth daily before breakfast. 01/31/22 01/31/23 Yes Biagio Borg, MD  lisinopril (ZESTRIL) 20 MG tablet Take 1 tablet (20 mg total) by mouth daily. 06/26/22 06/26/23 Yes Thurnell Lose, MD  Multiple Vitamin (MULTIVITAMIN) tablet Take 1 tablet by mouth daily.   Yes [provider]  Omega-3 Fatty Acids (OMEGA-3 FISH OIL PO) Take 1 capsule by mouth daily.   Yes [provider]  rosuvastatin (CRESTOR) 20 MG tablet TAKE 1 TABLET BY MOUTH ONCE DAILY Patient taking  differently: Take 20 mg by mouth daily. 01/31/22 01/31/23 Yes Biagio Borg, MD  doxycycline (VIBRA-TABS) 100 MG tablet Take 1 tablet (100 mg total) by mouth 2 (two) times daily. Patient not taking: Reported on 08/06/2022 07/02/22   Biagio Borg, MD  furosemide (LASIX) 20 MG tablet Take 1 tablet (20 mg total) by mouth daily for 2 days only. Patient not taking: Reported on 08/06/2022 07/02/22   Biagio Borg, MD      Allergies    Amlodipine, Hydrochlorothiazide, Morphine, and Tessalon [benzonatate]    Review of Systems   Review of Systems  Unable to perform ROS: Dementia  Constitutional:  Positive for fever.    Physical Exam Updated Vital Signs BP 114/67   Pulse 84   Temp (!) 97.4 F (36.3 C)   Resp 17   Ht 1.753 m (5\' 9" )   Wt 74.8 kg   SpO2 93%   BMI 24.37 kg/m  Physical Exam Vitals and nursing note reviewed.  Constitutional:      Appearance: He is well-developed. He is ill-appearing.  HENT:     Head: Normocephalic and atraumatic.     Mouth/Throat:     Pharynx: No oropharyngeal exudate.  Eyes:     General: No scleral icterus.       Right eye: No discharge.        Left eye: No discharge.     Conjunctiva/sclera: Conjunctivae normal.  Pupils: Pupils are equal, round, and reactive to light.  Neck:     Thyroid: No thyromegaly.     Vascular: No JVD.  Cardiovascular:     Rate and Rhythm: Normal rate and regular rhythm.     Heart sounds: Normal heart sounds. No murmur heard.    No friction rub. No gallop.  Pulmonary:     Effort: Pulmonary effort is normal. No respiratory distress.     Breath sounds: Normal breath sounds. No wheezing or rales.  Abdominal:     General: Bowel sounds are normal. There is no distension.     Palpations: Abdomen is soft. There is no mass.     Tenderness: There is no abdominal tenderness.  Musculoskeletal:        General: No tenderness. Normal range of motion.     Cervical back: Normal range of motion and neck supple.     Right lower  leg: Edema present.     Left lower leg: Edema present.     Comments: Symmetrical scant bilateral pitting edema below the knees  Lymphadenopathy:     Cervical: No cervical adenopathy.  Skin:    General: Skin is warm and dry.     Findings: No erythema or rash.  Neurological:     Mental Status: He is alert.     Coordination: Coordination normal.     Comments: Very hard of hearing but able to move all 4 extremities.  The patient cannot stand up from the wheelchair to get onto the bed without assistance.  This is unusual for him according to family  Psychiatric:        Behavior: Behavior normal.     ED Results / Procedures / Treatments   Labs (all labs ordered are listed, but only abnormal results are displayed) Labs Reviewed  LACTIC ACID, PLASMA - Abnormal; Notable for the following components:      Result Value   Lactic Acid, Venous 2.2 (*)    All other components within normal limits  LACTIC ACID, PLASMA - Abnormal; Notable for the following components:   Lactic Acid, Venous 2.7 (*)    All other components within normal limits  COMPREHENSIVE METABOLIC PANEL - Abnormal; Notable for the following components:   Potassium 3.4 (*)    CO2 21 (*)    Glucose, Bld 127 (*)    BUN 24 (*)    Creatinine, Ser 1.42 (*)    Calcium 8.8 (*)    Albumin 3.3 (*)    AST 63 (*)    GFR, Estimated 47 (*)    All other components within normal limits  CBC WITH DIFFERENTIAL/PLATELET - Abnormal; Notable for the following components:   WBC 16.1 (*)    RBC 3.87 (*)    Hemoglobin 12.3 (*)    HCT 36.5 (*)    RDW 15.6 (*)    Neutro Abs 13.5 (*)    Abs Immature Granulocytes 0.16 (*)    All other components within normal limits  PROTIME-INR - Abnormal; Notable for the following components:   Prothrombin Time 15.8 (*)    INR 1.3 (*)    All other components within normal limits  URINALYSIS, ROUTINE W REFLEX MICROSCOPIC - Abnormal; Notable for the following components:   Hgb urine dipstick SMALL (*)     Protein, ur 30 (*)    All other components within normal limits  URINALYSIS, MICROSCOPIC (REFLEX) - Abnormal; Notable for the following components:   Bacteria, UA RARE (*)    All  other components within normal limits  RESP PANEL BY RT-PCR (RSV, FLU A&B, COVID)  RVPGX2  CULTURE, BLOOD (ROUTINE X 2)  CULTURE, BLOOD (ROUTINE X 2)  APTT    EKG EKG Interpretation  Date/Time:  Monday August 06 2022 16:33:27 EST Ventricular Rate:  84 PR Interval:  152 QRS Duration: 86 QT Interval:  384 QTC Calculation: 453 R Axis:   15 Text Interpretation: Normal sinus rhythm Nonspecific ST abnormality Abnormal ECG When compared with ECG of 19-May-2015 08:05, PREVIOUS ECG IS PRESENT Confirmed by Noemi Chapel [phone]) on 08/06/2022 7:06:43 PM  Radiology DG Chest 2 View  Result Date: 08/06/2022 CLINICAL DATA:  Fever and chills EXAM: CHEST - 2 VIEW COMPARISON:  Radiographs 07/02/2022 FINDINGS: The heart size and mediastinal contours are within normal limits. Both lungs are clear. The visualized skeletal structures are unremarkable. Aortic atherosclerotic calcification. Cervical spine fusion hardware. IMPRESSION: No active cardiopulmonary disease. Electronically Signed   By: Placido Sou M.D.   On: 08/06/2022 17:30    Procedures .Critical Care  Performed by: Noemi Chapel, MD Authorized by: Noemi Chapel, MD   Critical care provider statement:    Critical care time (minutes):  45   Critical care time was exclusive of:  Separately billable procedures and treating other patients and teaching time   Critical care was necessary to treat or prevent imminent or life-threatening deterioration of the following conditions:  Sepsis   Critical care was time spent personally by me on the following activities:  Development of treatment plan with patient or surrogate, discussions with consultants, evaluation of patient's response to treatment, examination of patient, obtaining history from patient or surrogate,  review of old charts, re-evaluation of patient's condition, pulse oximetry, ordering and review of radiographic studies, ordering and review of laboratory studies and ordering and performing treatments and interventions   I assumed direction of critical care for this patient from another provider in my specialty: no     Care discussed with: admitting provider   Comments:           Medications Ordered in ED Medications  acetaminophen (TYLENOL) tablet 650 mg (650 mg Oral Given 08/06/22 1516)  cefTRIAXone (ROCEPHIN) 2 g in sodium chloride 0.9 % 100 mL IVPB (0 g Intravenous Stopped 08/06/22 2038)  sodium chloride 0.9 % bolus 2,500 mL (2,500 mLs Intravenous New Bag/Given 08/06/22 1952)    ED Course/ Medical Decision Making/ A&P                           Medical Decision Making Risk OTC drugs. Decision regarding hospitalization.   This patient presents to the ED for concern of fever and generalized weakness, this involves an extensive number of treatment options, and is a complaint that carries with it a high risk of complications and morbidity.  The differential diagnosis includes sepsis, pneumonia, UTI, viral illnesses, there does not appear to be any cellulitis   Co morbidities that complicate the patient evaluation  Hypertension, dementia   Additional history obtained:  Additional history obtained from electronic medical record and family members External records from outside source obtained and reviewed including the patient had been admitted to the hospital approximately 6 weeks ago after having an acute kidney injury, altered mental status and a fall that led to mild rhabdomyolysis.   Lab Tests:  I Ordered, and personally interpreted labs.  The pertinent results include: Lactic acid that went from 2.2 up to 2.7.  IV fluids will be ordered,  antibiotics were ordered, CBC showed a leukocytosis of 16,000 with left-sided shift, hemoglobin is baseline, platelets are baseline.   Creatinine was 1.4 which is up from baseline of 0.8 about 1 month ago.  This acute kidney injury does signify endorgan injury.  Urinalysis did not show infection, COVID and flu test were negative   Imaging Studies ordered:  I ordered imaging studies including portable chest x-ray I independently visualized and interpreted imaging which showed no acute findings including pneumonia infiltrate or pneumothorax I agree with the radiologist interpretation   Cardiac Monitoring: / EKG:  The patient was maintained on a cardiac monitor.  I personally viewed and interpreted the cardiac monitored which showed an underlying rhythm of: Initially the patient had some tachycardia but this improved, blood pressure also improved with IV fluids   Consultations Obtained:  I requested consultation with the hospitalist,  and discussed lab and imaging findings as well as pertinent plan - they recommend: Admit   Problem List / ED Course / Critical interventions / Medication management  Patient given antibiotics presumptively for infection, at this time it is not clear exactly where the infection is coming from but the temperature was high, heart rate and blood pressure have stabilized, white blood cell count was elevated at 16,000, there is endorgan dysfunction suggestive of severe sepsis.  Still no sources available but antibiotics were given I ordered medication including Rocephin and IV fluids for infection, Tylenol given for fever. Reevaluation of the patient after these medicines showed that the patient improved I have reviewed the patients home medicines and have made adjustments as needed   Social Determinants of Health:  Very hard of hearing, very weak, elderly   Test / Admission - Considered:  Will be admitted to high level of care         Final Clinical Impression(s) / ED Diagnoses Final diagnoses:  Severe sepsis [facility])  Renal insufficiency    Rx / DC Orders ED Discharge Orders      None         Noemi Chapel, MD 08/06/22 2224

## 2022-08-06 NOTE — ED Notes (Signed)
200cc noted in bladder with bladder scan. Offered for patient to use urinal, refused and does not indicate understanding the request d/t AMS. In/out  cath with 16Fr straight tip attempted, resistance felt associated with prostate enlargement. Catheter removed; no indication of blood or other trauma. Reattempted in/out cath with 18Fr coude cath; urine return obtained and bladder emptied.

## 2022-08-06 NOTE — ED Triage Notes (Signed)
C/o fever onset yest , son states he started having chills yest. States patient has been having a blank stare at times. Patient does have a history of dementia.

## 2022-08-06 NOTE — ED Provider Triage Note (Signed)
Emergency Medicine Provider Triage Evaluation Note  Erik Jackson , a 88 y.o. male  was evaluated in triage.  Pt complains of fever and confusion. Patient had shaking chills and weakness yesterday. Today noted to be very confused.  Review of Systems  Positive: fever Negative: cough  Physical Exam  BP (!) 109/53   Pulse 100   Temp (!) 102 F (38.9 C) (Oral)   Resp 18   Ht 5\' 9"  (1.753 m)   Wt 74.8 kg   SpO2 92%   BMI 24.37 kg/m  Gen:   Awake, no distress   Resp:  Normal effort  MSK:   Moves extremities without difficulty  Other:  fever  Medical Decision Making  Medically screening exam initiated at 4:02 PM.  Appropriate orders placed.  OGDEN HANDLIN was informed that the remainder of the evaluation will be completed by another provider, this initial triage assessment does not replace that evaluation, and the importance of remaining in the ED until their evaluation is complete.     Threasa Alpha, PA-C 08/06/22 1606

## 2022-08-07 DIAGNOSIS — E785 Hyperlipidemia, unspecified: Secondary | ICD-10-CM | POA: Diagnosis present

## 2022-08-07 DIAGNOSIS — G9341 Metabolic encephalopathy: Secondary | ICD-10-CM | POA: Diagnosis not present

## 2022-08-07 DIAGNOSIS — Z888 Allergy status to other drugs, medicaments and biological substances status: Secondary | ICD-10-CM | POA: Diagnosis not present

## 2022-08-07 DIAGNOSIS — L0390 Cellulitis, unspecified: Secondary | ICD-10-CM

## 2022-08-07 DIAGNOSIS — F03A4 Unspecified dementia, mild, with anxiety: Secondary | ICD-10-CM | POA: Diagnosis present

## 2022-08-07 DIAGNOSIS — L309 Dermatitis, unspecified: Secondary | ICD-10-CM | POA: Diagnosis present

## 2022-08-07 DIAGNOSIS — K7689 Other specified diseases of liver: Secondary | ICD-10-CM | POA: Diagnosis present

## 2022-08-07 DIAGNOSIS — R911 Solitary pulmonary nodule: Secondary | ICD-10-CM | POA: Diagnosis present

## 2022-08-07 DIAGNOSIS — Z7982 Long term (current) use of aspirin: Secondary | ICD-10-CM | POA: Diagnosis not present

## 2022-08-07 DIAGNOSIS — A419 Sepsis, unspecified organism: Secondary | ICD-10-CM | POA: Diagnosis not present

## 2022-08-07 DIAGNOSIS — Z87891 Personal history of nicotine dependence: Secondary | ICD-10-CM | POA: Diagnosis not present

## 2022-08-07 DIAGNOSIS — E872 Acidosis, unspecified: Secondary | ICD-10-CM | POA: Diagnosis present

## 2022-08-07 DIAGNOSIS — R6520 Severe sepsis without septic shock: Secondary | ICD-10-CM | POA: Diagnosis present

## 2022-08-07 DIAGNOSIS — N179 Acute kidney failure, unspecified: Secondary | ICD-10-CM | POA: Diagnosis present

## 2022-08-07 DIAGNOSIS — Z885 Allergy status to narcotic agent status: Secondary | ICD-10-CM | POA: Diagnosis not present

## 2022-08-07 DIAGNOSIS — R262 Difficulty in walking, not elsewhere classified: Secondary | ICD-10-CM | POA: Diagnosis present

## 2022-08-07 DIAGNOSIS — R509 Fever, unspecified: Secondary | ICD-10-CM

## 2022-08-07 DIAGNOSIS — Z96643 Presence of artificial hip joint, bilateral: Secondary | ICD-10-CM | POA: Diagnosis present

## 2022-08-07 DIAGNOSIS — J45909 Unspecified asthma, uncomplicated: Secondary | ICD-10-CM | POA: Diagnosis present

## 2022-08-07 DIAGNOSIS — Z8719 Personal history of other diseases of the digestive system: Secondary | ICD-10-CM | POA: Diagnosis not present

## 2022-08-07 DIAGNOSIS — E876 Hypokalemia: Secondary | ICD-10-CM | POA: Diagnosis present

## 2022-08-07 DIAGNOSIS — Z974 Presence of external hearing-aid: Secondary | ICD-10-CM | POA: Diagnosis not present

## 2022-08-07 DIAGNOSIS — Z20822 Contact with and (suspected) exposure to covid-19: Secondary | ICD-10-CM | POA: Diagnosis present

## 2022-08-07 DIAGNOSIS — L03116 Cellulitis of left lower limb: Secondary | ICD-10-CM | POA: Diagnosis present

## 2022-08-07 DIAGNOSIS — E039 Hypothyroidism, unspecified: Secondary | ICD-10-CM | POA: Diagnosis present

## 2022-08-07 DIAGNOSIS — I10 Essential (primary) hypertension: Secondary | ICD-10-CM | POA: Diagnosis present

## 2022-08-07 LAB — TSH: TSH: 0.543 u[IU]/mL (ref 0.350–4.500)

## 2022-08-07 LAB — AMMONIA: Ammonia: 20 umol/L (ref 9–35)

## 2022-08-07 MED ORDER — VANCOMYCIN HCL 1500 MG/300ML IV SOLN
1500.0000 mg | Freq: Once | INTRAVENOUS | Status: AC
  Administered 2022-08-07: 1500 mg via INTRAVENOUS
  Filled 2022-08-07: qty 300

## 2022-08-07 MED ORDER — ADULT MULTIVITAMIN W/MINERALS CH
1.0000 | ORAL_TABLET | Freq: Every day | ORAL | Status: DC
  Administered 2022-08-07 – 2022-08-09 (×3): 1 via ORAL
  Filled 2022-08-07 (×3): qty 1

## 2022-08-07 MED ORDER — ASPIRIN 81 MG PO CHEW
81.0000 mg | CHEWABLE_TABLET | Freq: Every day | ORAL | Status: DC
  Administered 2022-08-07 – 2022-08-09 (×3): 81 mg via ORAL
  Filled 2022-08-07 (×3): qty 1

## 2022-08-07 MED ORDER — ROSUVASTATIN CALCIUM 20 MG PO TABS
20.0000 mg | ORAL_TABLET | Freq: Every day | ORAL | Status: DC
  Administered 2022-08-07 – 2022-08-09 (×3): 20 mg via ORAL
  Filled 2022-08-07 (×3): qty 1

## 2022-08-07 MED ORDER — HYDROXYZINE HCL 10 MG PO TABS
5.0000 mg | ORAL_TABLET | Freq: Three times a day (TID) | ORAL | Status: DC | PRN
  Filled 2022-08-07: qty 1

## 2022-08-07 MED ORDER — VANCOMYCIN VARIABLE DOSE PER UNSTABLE RENAL FUNCTION (PHARMACIST DOSING): Status: DC

## 2022-08-07 MED ORDER — ENOXAPARIN SODIUM 30 MG/0.3ML IJ SOSY
30.0000 mg | PREFILLED_SYRINGE | INTRAMUSCULAR | Status: DC
  Administered 2022-08-07: 30 mg via SUBCUTANEOUS
  Filled 2022-08-07: qty 0.3

## 2022-08-07 MED ORDER — LEVOTHYROXINE SODIUM 75 MCG PO TABS
75.0000 ug | ORAL_TABLET | Freq: Every day | ORAL | Status: DC
  Administered 2022-08-07 – 2022-08-09 (×3): 75 ug via ORAL
  Filled 2022-08-07 (×3): qty 1
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
## 2022-08-08 LAB — COMPREHENSIVE METABOLIC PANEL
ALT: 26 U/L (ref 0–44)
AST: 46 U/L — ABNORMAL HIGH (ref 15–41)
Albumin: 2.3 g/dL — ABNORMAL LOW (ref 3.5–5.0)
Alkaline Phosphatase: 52 U/L (ref 38–126)
Anion gap: 9 (ref 5–15)
BUN: 27 mg/dL — ABNORMAL HIGH (ref 8–23)
CO2: 23 mmol/L (ref 22–32)
Calcium: 8 mg/dL — ABNORMAL LOW (ref 8.9–10.3)
Chloride: 105 mmol/L (ref 98–111)
Creatinine, Ser: 0.91 mg/dL (ref 0.61–1.24)
GFR, Estimated: >60 mL/min (ref >60)
Glucose, Bld: 84 mg/dL (ref 70–99)
Potassium: 3.2 mmol/L — ABNORMAL LOW (ref 3.5–5.1)
Sodium: 137 mmol/L (ref 135–145)
Total Bilirubin: 0.7 mg/dL (ref 0.3–1.2)
Total Protein: 5.2 g/dL — ABNORMAL LOW (ref 6.5–8.1)

## 2022-08-08 LAB — CBC
HCT: 29.7 % — ABNORMAL LOW (ref 39.0–52.0)
Hemoglobin: 10.2 g/dL — ABNORMAL LOW (ref 13.0–17.0)
MCH: 31.9 pg (ref 26.0–34.0)
MCHC: 34.3 g/dL (ref 30.0–36.0)
MCV: 92.8 fL (ref 80.0–100.0)
Platelets: 216 10*3/uL (ref 150–400)
RBC: 3.2 MIL/uL — ABNORMAL LOW (ref 4.22–5.81)
RDW: 15.7 % — ABNORMAL HIGH (ref 11.5–15.5)
WBC: 12.7 10*3/uL — ABNORMAL HIGH (ref 4.0–10.5)
nRBC: 0 % (ref 0.0–0.2)

## 2022-08-08 LAB — VANCOMYCIN, RANDOM: Vancomycin Rm: 8 ug/mL

## 2022-08-08 LAB — MAGNESIUM: Magnesium: 1.8 mg/dL (ref 1.7–2.4)

## 2022-08-08 MED ORDER — POTASSIUM CHLORIDE CRYS ER 20 MEQ PO TBCR
40.0000 meq | EXTENDED_RELEASE_TABLET | Freq: Once | ORAL | Status: AC
  Administered 2022-08-08: 40 meq via ORAL
  Filled 2022-08-08: qty 2

## 2022-08-08 MED ORDER — VANCOMYCIN HCL 1250 MG/250ML IV SOLN
1250.0000 mg | INTRAVENOUS | Status: DC
  Administered 2022-08-08 – 2022-08-09 (×2): 1250 mg via INTRAVENOUS
  Filled 2022-08-08 (×2): qty 250

## 2022-08-08 MED ORDER — ENOXAPARIN SODIUM 40 MG/0.4ML IJ SOSY
40.0000 mg | PREFILLED_SYRINGE | INTRAMUSCULAR | Status: DC
  Administered 2022-08-08 – 2022-08-09 (×2): 40 mg via SUBCUTANEOUS
  Filled 2022-08-08 (×2): qty 0.4
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
## 2022-08-08 NOTE — Progress Notes (Signed)
Refused Vital Signs

## 2022-08-08 NOTE — Progress Notes (Signed)
Mobility Specialist Progress Note:   08/08/22 1020  Mobility  Activity Ambulated independently in room  Level of Assistance Independent  Assistive Device None  Distance Ambulated (ft) 50 ft  Activity Response Tolerated well  Mobility Referral Yes  $Mobility charge 1 Mobility   Pt received in bed and agreeable with encouragement. No complaints of pain or SOB with ambulation. Pt left in bed with all needs met, call bell in reach, and RN in room.   Andrey Campanile Mobility Specialist Please contact via SecureChat or  Rehab office at [phone]
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
## 2022-08-09 ENCOUNTER — Other Ambulatory Visit (HOSPITAL_COMMUNITY): Payer: Self-pay

## 2022-08-09 LAB — COMPREHENSIVE METABOLIC PANEL
ALT: 39 U/L (ref 0–44)
AST: 65 U/L — ABNORMAL HIGH (ref 15–41)
Albumin: 2.2 g/dL — ABNORMAL LOW (ref 3.5–5.0)
Alkaline Phosphatase: 59 U/L (ref 38–126)
Anion gap: 7 (ref 5–15)
BUN: 23 mg/dL (ref 8–23)
CO2: 20 mmol/L — ABNORMAL LOW (ref 22–32)
Calcium: 8.1 mg/dL — ABNORMAL LOW (ref 8.9–10.3)
Chloride: 112 mmol/L — ABNORMAL HIGH (ref 98–111)
Creatinine, Ser: 0.82 mg/dL (ref 0.61–1.24)
GFR, Estimated: >60 mL/min (ref >60)
Glucose, Bld: 79 mg/dL (ref 70–99)
Potassium: 4.1 mmol/L (ref 3.5–5.1)
Sodium: 139 mmol/L (ref 135–145)
Total Bilirubin: 0.6 mg/dL (ref 0.3–1.2)
Total Protein: 5.2 g/dL — ABNORMAL LOW (ref 6.5–8.1)

## 2022-08-09 LAB — CBC
HCT: 37 % — ABNORMAL LOW (ref 39.0–52.0)
Hemoglobin: 11.4 g/dL — ABNORMAL LOW (ref 13.0–17.0)
MCH: 31.9 pg (ref 26.0–34.0)
MCHC: 30.8 g/dL (ref 30.0–36.0)
MCV: 103.6 fL — ABNORMAL HIGH (ref 80.0–100.0)
Platelets: 198 10*3/uL (ref 150–400)
RBC: 3.57 MIL/uL — ABNORMAL LOW (ref 4.22–5.81)
RDW: 16.2 % — ABNORMAL HIGH (ref 11.5–15.5)
WBC: 6.7 10*3/uL (ref 4.0–10.5)
nRBC: 0 % (ref 0.0–0.2)

## 2022-08-09 MED ORDER — CEFADROXIL 500 MG PO CAPS
500.0000 mg | ORAL_CAPSULE | Freq: Two times a day (BID) | ORAL | 0 refills | Status: DC
  Filled 2022-08-09: qty 14, 7d supply, fill #0

## 2022-08-09 MED ORDER — DOXYCYCLINE HYCLATE 100 MG PO TABS
100.0000 mg | ORAL_TABLET | Freq: Two times a day (BID) | ORAL | 0 refills | Status: DC
  Filled 2022-08-09: qty 14, 7d supply, fill #0
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
## 2022-08-09 NOTE — Progress Notes (Signed)
Patient discharged back to [facility]F and transported by [employer], son. Son verbalized understanding of all discharge instructions, medications, appointments. PIV removed, gauze and tape applied. All personal items gathered. Attempted to call report x 2 to [facility]. Left voicemail for call back. Patient transported off unit for discharge via wheelchair with staff and son.

## 2022-08-09 NOTE — TOC Transition Note (Signed)
Transition of Care [facility]) - CM/SW Discharge Note   Patient Details  Name: Erik Jackson MRN: [id number] Date of Birth: 24-May-1931  [facility] CM/SW Contact:  Janae Bridgeman, RN Phone Number: 08/09/2022, 11:03 AM   Clinical Narrative:    CM called and spoke with the patient's  son by phone and the patient will be discharged back to [facility] today once the patient's son arrives.  I spoke with the patient's son and I will include PT therapy evaluation and discharge summary and fax them to the facility to attention of Rolly Pancake, CM at [facility].   Final next level of care: Home w Home Health Services Barriers to Discharge: No Barriers Identified   Patient Goals and CMS Choice Patient states their goals for this hospitalization and ongoing recovery are:: To return home CMS Medicare.gov Compare Post Acute Care list provided to:: Patient Choice offered to / list presented to : Patient  Discharge Placement                       Discharge Plan and Services   Discharge Planning Services: CM Consult Post Acute Care Choice: Home Health                    HH Arranged:  ([facility] contract therapy department plans to follow up with the patient at the facility when he returns.)       Representative spoke with at [facility]: Spoke with L[place]le [place], CM at [facility]  Social Determinants of Health (SDOH) Interventions     Readmission Risk Interventions    08/09/2022   10:52 AM  Readmission Risk Prevention Plan  Transportation Screening Complete  PCP or Specialist Appt within 5-7 Days Complete  Home Care Screening Complete  Medication Review (RN CM) Complete
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
## 2022-08-10 ENCOUNTER — Other Ambulatory Visit (HOSPITAL_COMMUNITY): Payer: Self-pay

## 2022-08-11 LAB — CULTURE, BLOOD (ROUTINE X 2)
Culture: NO GROWTH
Culture: NO GROWTH
Special Requests: ADEQUATE
Special Requests: ADEQUATE

## 2022-08-15 ENCOUNTER — Other Ambulatory Visit: Payer: Self-pay | Admitting: Internal Medicine

## 2022-08-15 NOTE — Telephone Encounter (Signed)
Please refill as per office routine med refill policy (all routine meds to be refilled for 3 mo or monthly (per pt preference) up to one year from last visit, then month to month grace period for 3 mo, then further med refills will have to be denied) ? ?

## 2022-09-23 DIAGNOSIS — S0990XA Unspecified injury of head, initial encounter: Secondary | ICD-10-CM | POA: Diagnosis not present

## 2022-09-23 DIAGNOSIS — R0781 Pleurodynia: Secondary | ICD-10-CM | POA: Diagnosis not present

## 2022-09-23 DIAGNOSIS — W1830XA Fall on same level, unspecified, initial encounter: Secondary | ICD-10-CM | POA: Diagnosis not present

## 2022-09-24 DIAGNOSIS — S0003XA Contusion of scalp, initial encounter: Secondary | ICD-10-CM | POA: Diagnosis not present

## 2022-09-24 DIAGNOSIS — S0081XA Abrasion of other part of head, initial encounter: Secondary | ICD-10-CM | POA: Diagnosis not present

## 2022-09-24 DIAGNOSIS — Y999 Unspecified external cause status: Secondary | ICD-10-CM | POA: Diagnosis not present

## 2022-09-24 DIAGNOSIS — W01198A Fall on same level from slipping, tripping and stumbling with subsequent striking against other object, initial encounter: Secondary | ICD-10-CM | POA: Diagnosis not present

## 2022-09-24 DIAGNOSIS — I672 Cerebral atherosclerosis: Secondary | ICD-10-CM | POA: Diagnosis not present

## 2022-09-24 DIAGNOSIS — S2242XA Multiple fractures of ribs, left side, initial encounter for closed fracture: Secondary | ICD-10-CM | POA: Diagnosis not present

## 2022-09-24 DIAGNOSIS — R0781 Pleurodynia: Secondary | ICD-10-CM | POA: Diagnosis not present

## 2022-09-24 DIAGNOSIS — S199XXA Unspecified injury of neck, initial encounter: Secondary | ICD-10-CM | POA: Diagnosis not present

## 2022-09-28 ENCOUNTER — Ambulatory Visit: Payer: Medicare Other | Admitting: Internal Medicine

## 2022-11-10 ENCOUNTER — Other Ambulatory Visit: Payer: Self-pay | Admitting: Internal Medicine

## 2022-11-10 NOTE — Telephone Encounter (Signed)
Please refill as per office routine med refill policy (all routine meds to be refilled for 3 mo or monthly (per pt preference) up to one year from last visit, then month to month grace period for 3 mo, then further med refills will have to be denied) ? ?

## 2023-01-14 ENCOUNTER — Other Ambulatory Visit: Payer: Self-pay | Admitting: Internal Medicine

## 2023-01-18 ENCOUNTER — Telehealth: Payer: Self-pay | Admitting: Internal Medicine

## 2023-01-18 NOTE — Telephone Encounter (Signed)
Refill sent.

## 2023-01-18 NOTE — Telephone Encounter (Signed)
Pt son called and stated pt has upcoming appt and he will soon be out of his  levothyroxine (SYNTHROID) 75 MCG tablet so pt wants to know can pt have a temporary refill.

## 2023-02-06 ENCOUNTER — Other Ambulatory Visit: Payer: Self-pay | Admitting: Internal Medicine

## 2023-03-06 ENCOUNTER — Encounter: Payer: Self-pay | Admitting: Internal Medicine

## 2023-03-06 ENCOUNTER — Ambulatory Visit (INDEPENDENT_AMBULATORY_CARE_PROVIDER_SITE_OTHER): Payer: Medicare Other

## 2023-03-06 ENCOUNTER — Ambulatory Visit (INDEPENDENT_AMBULATORY_CARE_PROVIDER_SITE_OTHER): Payer: Medicare Other | Admitting: Internal Medicine

## 2023-03-06 VITALS — BP 124/60 | HR 63 | Temp 97.9°F | Ht 69.0 in | Wt 159.6 lb

## 2023-03-06 VITALS — BP 124/60 | HR 63 | Temp 97.9°F | Ht 69.0 in | Wt 159.0 lb

## 2023-03-06 DIAGNOSIS — E538 Deficiency of other specified B group vitamins: Secondary | ICD-10-CM

## 2023-03-06 DIAGNOSIS — Z0000 Encounter for general adult medical examination without abnormal findings: Secondary | ICD-10-CM | POA: Diagnosis not present

## 2023-03-06 DIAGNOSIS — E7800 Pure hypercholesterolemia, unspecified: Secondary | ICD-10-CM | POA: Diagnosis not present

## 2023-03-06 DIAGNOSIS — F03B0 Unspecified dementia, moderate, without behavioral disturbance, psychotic disturbance, mood disturbance, and anxiety: Secondary | ICD-10-CM | POA: Diagnosis not present

## 2023-03-06 DIAGNOSIS — E559 Vitamin D deficiency, unspecified: Secondary | ICD-10-CM | POA: Diagnosis not present

## 2023-03-06 DIAGNOSIS — L570 Actinic keratosis: Secondary | ICD-10-CM | POA: Insufficient documentation

## 2023-03-06 DIAGNOSIS — I10 Essential (primary) hypertension: Secondary | ICD-10-CM | POA: Diagnosis not present

## 2023-03-06 DIAGNOSIS — R739 Hyperglycemia, unspecified: Secondary | ICD-10-CM

## 2023-03-06 DIAGNOSIS — E039 Hypothyroidism, unspecified: Secondary | ICD-10-CM

## 2023-03-06 DIAGNOSIS — Z0001 Encounter for general adult medical examination with abnormal findings: Secondary | ICD-10-CM

## 2023-03-06 LAB — HEPATIC FUNCTION PANEL
ALT: 16 U/L (ref 0–53)
AST: 24 U/L (ref 0–37)
Albumin: 4.1 g/dL (ref 3.5–5.2)
Alkaline Phosphatase: 51 U/L (ref 39–117)
Bilirubin, Direct: 0.1 mg/dL (ref 0.0–0.3)
Total Bilirubin: 0.5 mg/dL (ref 0.2–1.2)
Total Protein: 7.4 g/dL (ref 6.0–8.3)

## 2023-03-06 LAB — CBC WITH DIFFERENTIAL/PLATELET
Basophils Absolute: 0.1 10*3/uL (ref 0.0–0.1)
Basophils Relative: 1.1 % (ref 0.0–3.0)
Eosinophils Absolute: 0.4 10*3/uL (ref 0.0–0.7)
Eosinophils Relative: 6.8 % — ABNORMAL HIGH (ref 0.0–5.0)
HCT: 38.7 % — ABNORMAL LOW (ref 39.0–52.0)
Hemoglobin: 12.8 g/dL — ABNORMAL LOW (ref 13.0–17.0)
Lymphocytes Relative: 34.1 % (ref 12.0–46.0)
Lymphs Abs: 2.1 10*3/uL (ref 0.7–4.0)
MCHC: 33 g/dL (ref 30.0–36.0)
MCV: 93.1 fl (ref 78.0–100.0)
Monocytes Absolute: 0.4 10*3/uL (ref 0.1–1.0)
Monocytes Relative: 7.1 % (ref 3.0–12.0)
Neutro Abs: 3.1 10*3/uL (ref 1.4–7.7)
Neutrophils Relative %: 50.9 % (ref 43.0–77.0)
Platelets: 335 10*3/uL (ref 150.0–400.0)
RBC: 4.16 Mil/uL — ABNORMAL LOW (ref 4.22–5.81)
RDW: 15.9 % — ABNORMAL HIGH (ref 11.5–15.5)
WBC: 6.1 10*3/uL (ref 4.0–10.5)

## 2023-03-06 LAB — BASIC METABOLIC PANEL
BUN: 15 mg/dL (ref 6–23)
CO2: 30 mEq/L (ref 19–32)
Calcium: 9.7 mg/dL (ref 8.4–10.5)
Chloride: 101 mEq/L (ref 96–112)
Creatinine, Ser: 0.91 mg/dL (ref 0.40–1.50)
GFR: 74.34 mL/min (ref >60.00)
Glucose, Bld: 83 mg/dL (ref 70–99)
Potassium: 4.2 mEq/L (ref 3.5–5.1)
Sodium: 139 mEq/L (ref 135–145)

## 2023-03-06 LAB — URINALYSIS, ROUTINE W REFLEX MICROSCOPIC
Bilirubin Urine: NEGATIVE
Hgb urine dipstick: NEGATIVE
Ketones, ur: NEGATIVE
Leukocytes,Ua: NEGATIVE
Nitrite: NEGATIVE
Specific Gravity, Urine: 1.015 (ref 1.000–1.030)
Total Protein, Urine: NEGATIVE
Urine Glucose: NEGATIVE
Urobilinogen, UA: 0.2 (ref 0.0–1.0)
pH: 6.5 (ref 5.0–8.0)

## 2023-03-06 LAB — VITAMIN B12: Vitamin B-12: 150 pg/mL — ABNORMAL LOW (ref 211–911)

## 2023-03-06 LAB — TSH: TSH: 0.87 u[IU]/mL (ref 0.35–5.50)

## 2023-03-06 LAB — LIPID PANEL
Cholesterol: 130 mg/dL (ref 0–200)
HDL: 34.2 mg/dL — ABNORMAL LOW (ref >39.00)
LDL Cholesterol: 57 mg/dL (ref 0–99)
NonHDL: 95.74
Total CHOL/HDL Ratio: 4
Triglycerides: 196 mg/dL — ABNORMAL HIGH (ref 0.0–149.0)
VLDL: 39.2 mg/dL (ref 0.0–40.0)

## 2023-03-06 LAB — HEMOGLOBIN A1C: Hgb A1c MFr Bld: 5.8 % (ref 4.6–6.5)

## 2023-03-06 LAB — VITAMIN D 25 HYDROXY (VIT D DEFICIENCY, FRACTURES): VITD: 60.55 ng/mL (ref 30.00–100.00)

## 2023-03-06 MED ORDER — ROSUVASTATIN CALCIUM 20 MG PO TABS: ORAL_TABLET | Freq: Every day | ORAL | 3 refills | Status: AC

## 2023-03-06 MED ORDER — LISINOPRIL 20 MG PO TABS: 20.0000 mg | ORAL_TABLET | Freq: Every day | ORAL | 3 refills | Status: AC

## 2023-03-06 MED ORDER — LEVOTHYROXINE SODIUM 75 MCG PO TABS: 75.0000 ug | ORAL_TABLET | Freq: Every day | ORAL | 3 refills | Status: AC
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
## 2023-03-06 NOTE — Patient Instructions (Addendum)
Erik Jackson , Thank you for taking time to come for your Medicare Wellness Visit. I appreciate your ongoing commitment to your health goals. Please review the following plan we discussed and let me know if I can assist you in the future.   These are the goals we discussed:  Goals      Client understands the importance of follow-up with providers by attending scheduled visits.        This is a list of the screening recommended for you and due dates:  Health Maintenance  Topic Date Due   Zoster (Shingles) Vaccine (1 of 2) Never done   COVID-19 Vaccine (3 - Pfizer risk series) 11/16/2019   Medicare Annual Wellness Visit  02/01/2023   Flu Shot  04/11/2023   DTaP/Tdap/Td vaccine (2 - Td or Tdap) 07/14/2026   Pneumonia Vaccine  Completed   HPV Vaccine  Aged Out    Advanced directives: Yes  Conditions/risks identified: Yes  Next appointment: Follow up in one year for your annual wellness visit.   Preventive Care 29 Years and Older, Male  Preventive care refers to lifestyle choices and visits with your health care provider that can promote health and wellness. What does preventive care include? A yearly physical exam. This is also called an annual well check. Dental exams once or twice a year. Routine eye exams. Ask your health care provider how often you should have your eyes checked. Personal lifestyle choices, including: Daily care of your teeth and gums. Regular physical activity. Eating a healthy diet. Avoiding tobacco and drug use. Limiting alcohol use. Practicing safe sex. Taking low doses of aspirin every day. Taking vitamin and mineral supplements as recommended by your health care provider. What happens during an annual well check? The services and screenings done by your health care provider during your annual well check will depend on your age, overall health, lifestyle risk factors, and family history of disease. Counseling  Your health care provider may ask you  questions about your: Alcohol use. Tobacco use. Drug use. Emotional well-being. Home and relationship well-being. Sexual activity. Eating habits. History of falls. Memory and ability to understand (cognition). Work and work Astronomer. Screening  You may have the following tests or measurements: Height, weight, and BMI. Blood pressure. Lipid and cholesterol levels. These may be checked every 5 years, or more frequently if you are over 73 years old. Skin check. Lung cancer screening. You may have this screening every year starting at age 49 if you have a 30-pack-year history of smoking and currently smoke or have quit within the past 15 years. Fecal occult blood test (FOBT) of the stool. You may have this test every year starting at age 3. Flexible sigmoidoscopy or colonoscopy. You may have a sigmoidoscopy every 5 years or a colonoscopy every 10 years starting at age 63. Prostate cancer screening. Recommendations will vary depending on your family history and other risks. Hepatitis C blood test. Hepatitis B blood test. Sexually transmitted disease (STD) testing. Diabetes screening. This is done by checking your blood sugar (glucose) after you have not eaten for a while (fasting). You may have this done every 1-3 years. Abdominal aortic aneurysm (AAA) screening. You may need this if you are a current or former smoker. Osteoporosis. You may be screened starting at age 29 if you are at high risk. Talk with your health care provider about your test results, treatment options, and if necessary, the need for more tests. Vaccines  Your health care provider may  recommend certain vaccines, such as: Influenza vaccine. This is recommended every year. Tetanus, diphtheria, and acellular pertussis (Tdap, Td) vaccine. You may need a Td booster every 10 years. Zoster vaccine. You may need this after age 46. Pneumococcal 13-valent conjugate (PCV13) vaccine. One dose is recommended after age  51. Pneumococcal polysaccharide (PPSV23) vaccine. One dose is recommended after age 65. Talk to your health care provider about which screenings and vaccines you need and how often you need them. This information is not intended to replace advice given to you by your health care provider. Make sure you discuss any questions you have with your health care provider. Document Released: 09/23/2015 Document Revised: 05/16/2016 Document Reviewed: 06/28/2015 Elsevier Interactive Patient Education  2017 [place] Prevention in the Home Falls can cause injuries. They can happen to people of all ages. There are many things you can do to make your home safe and to help prevent falls. What can I do on the outside of my home? Regularly fix the edges of walkways and driveways and fix any cracks. Remove anything that might make you trip as you walk through a door, such as a raised step or threshold. Trim any bushes or trees on the path to your home. Use bright outdoor lighting. Clear any walking paths of anything that might make someone trip, such as rocks or tools. Regularly check to see if handrails are loose or broken. Make sure that both sides of any steps have handrails. Any raised decks and porches should have guardrails on the edges. Have any leaves, snow, or ice cleared regularly. Use sand or salt on walking paths during winter. Clean up any spills in your garage right away. This includes oil or grease spills. What can I do in the bathroom? Use night lights. Install grab bars by the toilet and in the tub and shower. Do not use towel bars as grab bars. Use non-skid mats or decals in the tub or shower. If you need to sit down in the shower, use a plastic, non-slip stool. Keep the floor dry. Clean up any water that spills on the floor as soon as it happens. Remove soap buildup in the tub or shower regularly. Attach bath mats securely with double-sided non-slip rug tape. Do not have throw  rugs and other things on the floor that can make you trip. What can I do in the bedroom? Use night lights. Make sure that you have a light by your bed that is easy to reach. Do not use any sheets or blankets that are too big for your bed. They should not hang down onto the floor. Have a firm chair that has side arms. You can use this for support while you get dressed. Do not have throw rugs and other things on the floor that can make you trip. What can I do in the kitchen? Clean up any spills right away. Avoid walking on wet floors. Keep items that you use a lot in easy-to-reach places. If you need to reach something above you, use a strong step stool that has a grab bar. Keep electrical cords out of the way. Do not use floor polish or wax that makes floors slippery. If you must use wax, use non-skid floor wax. Do not have throw rugs and other things on the floor that can make you trip. What can I do with my stairs? Do not leave any items on the stairs. Make sure that there are handrails on both sides of  the stairs and use them. Fix handrails that are broken or loose. Make sure that handrails are as long as the stairways. Check any carpeting to make sure that it is firmly attached to the stairs. Fix any carpet that is loose or worn. Avoid having throw rugs at the top or bottom of the stairs. If you do have throw rugs, attach them to the floor with carpet tape. Make sure that you have a light switch at the top of the stairs and the bottom of the stairs. If you do not have them, ask someone to add them for you. What else can I do to help prevent falls? Wear shoes that: Do not have high heels. Have rubber bottoms. Are comfortable and fit you well. Are closed at the toe. Do not wear sandals. If you use a stepladder: Make sure that it is fully opened. Do not climb a closed stepladder. Make sure that both sides of the stepladder are locked into place. Ask someone to hold it for you, if  possible. Clearly mark and make sure that you can see: Any grab bars or handrails. First and last steps. Where the edge of each step is. Use tools that help you move around (mobility aids) if they are needed. These include: Canes. Walkers. Scooters. Crutches. Turn on the lights when you go into a dark area. Replace any light bulbs as soon as they burn out. Set up your furniture so you have a clear path. Avoid moving your furniture around. If any of your floors are uneven, fix them. If there are any pets around you, be aware of where they are. Review your medicines with your doctor. Some medicines can make you feel dizzy. This can increase your chance of falling. Ask your doctor what other things that you can do to help prevent falls. This information is not intended to replace advice given to you by your health care provider. Make sure you discuss any questions you have with your health care provider. Document Released: 06/23/2009 Document Revised: 02/02/2016 Document Reviewed: 10/01/2014 Elsevier Interactive Patient Education  2017 [employer].

## 2023-03-06 NOTE — Patient Instructions (Signed)

## 2023-03-09 ENCOUNTER — Encounter: Payer: Self-pay | Admitting: Internal Medicine

## 2023-03-09 NOTE — Assessment & Plan Note (Signed)
Stable overall, no behavioral issue worsening, declines aricept or other tx at this time

## 2023-03-09 NOTE — Assessment & Plan Note (Signed)
BP Readings from Last 3 Encounters:  03/06/23 124/60  03/06/23 124/60  08/09/22 115/77   Stable, pt to continue medical treatment lisinopril 20 qd

## 2023-03-09 NOTE — Assessment & Plan Note (Signed)
Lab Results  Component Value Date   TSH 0.87 03/06/2023   Stable, pt to continue levothyroxine 75 mcg qd

## 2023-03-09 NOTE — Assessment & Plan Note (Signed)
Age and sex appropriate education and counseling updated with regular exercise and diet Referrals for preventative services - none needed Immunizations addressed - declines covid booster, for shingrix at pharmacy Smoking counseling  - none needed Evidence for depression or other mood disorder - none significant Most recent labs reviewed. I have personally reviewed and have noted: 1) the patient's medical and social history 2) The patient's current medications and supplements 3) The patient's height, weight, and BMI have been recorded in the chart  

## 2023-03-09 NOTE — Assessment & Plan Note (Signed)
Lab Results  Component Value Date   HGBA1C 5.8 03/06/2023   Stable, pt to continue current medical treatment  - diet,wt control  

## 2023-03-09 NOTE — Assessment & Plan Note (Signed)
Lab Results  Component Value Date   LDLCALC 57 03/06/2023   Stable, pt to continue current statin crestor 20 qd

## 2023-03-09 NOTE — Assessment & Plan Note (Signed)
Lab Results  Component Value Date   VITAMINB12 150 (L) 03/06/2023   Low to start oral replacement - b12 1000 mcg qd

## 2023-04-30 ENCOUNTER — Ambulatory Visit (INDEPENDENT_AMBULATORY_CARE_PROVIDER_SITE_OTHER): Payer: Medicare Other | Admitting: Internal Medicine

## 2023-04-30 VITALS — BP 116/60 | HR 62 | Temp 97.6°F | Ht 69.0 in | Wt 159.0 lb

## 2023-04-30 DIAGNOSIS — E538 Deficiency of other specified B group vitamins: Secondary | ICD-10-CM | POA: Diagnosis not present

## 2023-04-30 DIAGNOSIS — E7800 Pure hypercholesterolemia, unspecified: Secondary | ICD-10-CM

## 2023-04-30 DIAGNOSIS — F03B0 Unspecified dementia, moderate, without behavioral disturbance, psychotic disturbance, mood disturbance, and anxiety: Secondary | ICD-10-CM

## 2023-04-30 DIAGNOSIS — I10 Essential (primary) hypertension: Secondary | ICD-10-CM

## 2023-04-30 DIAGNOSIS — R739 Hyperglycemia, unspecified: Secondary | ICD-10-CM

## 2023-04-30 DIAGNOSIS — E039 Hypothyroidism, unspecified: Secondary | ICD-10-CM | POA: Diagnosis not present

## 2023-04-30 NOTE — Patient Instructions (Addendum)
Please take all new medication as prescribed - the aricept 5  mg per day, and the seroquel 50 mg at bedtime  Please also restart the B12 1000 mcg per day for at least 6 months  Please continue all other medications as before, and refills have been done if requested.  Please have the pharmacy call with any other refills you may need.  Please continue your efforts at being more active, low cholesterol diet, and weight control.  Please keep your appointments with your specialists as you may have planned  Please make an Appointment to return in 6 months, or sooner if needed
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
## 2023-05-01 ENCOUNTER — Telehealth: Payer: Self-pay | Admitting: Internal Medicine

## 2023-05-01 MED ORDER — DONEPEZIL HCL 5 MG PO TABS: 5.0000 mg | ORAL_TABLET | Freq: Every day | ORAL | 3 refills | Status: AC

## 2023-05-01 MED ORDER — QUETIAPINE FUMARATE 50 MG PO TABS
50.0000 mg | ORAL_TABLET | Freq: Every day | ORAL | 3 refills | Status: DC
Start: 2023-05-01 — End: 2023-09-20

## 2023-05-01 NOTE — Telephone Encounter (Signed)
Patient's son called and said patient was seen 04/30/23 by Dr. Jonny Ruiz. They said he prescribed medications, but nothing was sent in. They would like to know if the medications can be sent in to [facility]. Best callback is [phone].

## 2023-05-01 NOTE — Telephone Encounter (Signed)
Very sorry for the oversight - now done erx

## 2023-05-01 NOTE — Telephone Encounter (Signed)
Called and let Pt know

## 2023-05-02 ENCOUNTER — Encounter: Payer: Self-pay | Admitting: Internal Medicine

## 2023-05-02 NOTE — Assessment & Plan Note (Signed)
BP Readings from Last 3 Encounters:  04/30/23 116/60  03/06/23 124/60  03/06/23 124/60   Stable, pt to continue medical treatment lisinopril 20 qd

## 2023-05-02 NOTE — Assessment & Plan Note (Signed)
Lab Results  Component Value Date   TSH 0.87 03/06/2023   Stable, pt to continue levothyroxine 75 mcg qd

## 2023-05-02 NOTE — Assessment & Plan Note (Signed)
Lab Results  Component Value Date   VITAMINB12 150 (L) 03/06/2023   Low, to start oral replacement - b12 1000 mcg qd

## 2023-05-02 NOTE — Assessment & Plan Note (Signed)
Lab Results  Component Value Date   LDLCALC 57 03/06/2023   Stable, pt to continue current statin crestor 20 qd

## 2023-05-02 NOTE — Assessment & Plan Note (Signed)
Lab Results  Component Value Date   HGBA1C 5.8 03/06/2023   Stable, pt to continue current medical treatment  - diet,wt control  

## 2023-05-02 NOTE — Assessment & Plan Note (Signed)
Worsening, for start aricept 5 qd

## 2023-08-27 DIAGNOSIS — H2513 Age-related nuclear cataract, bilateral: Secondary | ICD-10-CM | POA: Diagnosis not present

## 2023-08-27 DIAGNOSIS — H40033 Anatomical narrow angle, bilateral: Secondary | ICD-10-CM | POA: Diagnosis not present

## 2023-09-13 ENCOUNTER — Emergency Department (HOSPITAL_COMMUNITY): Payer: Medicare Other

## 2023-09-13 ENCOUNTER — Inpatient Hospital Stay (HOSPITAL_COMMUNITY)
Admission: EM | Admit: 2023-09-13 | Discharge: 2023-09-20 | DRG: 871 | Disposition: A | Discharge status: Skilled Nursing Facility | Payer: Medicare Other | Attending: Student | Admitting: Student

## 2023-09-13 ENCOUNTER — Encounter (HOSPITAL_COMMUNITY): Payer: Self-pay

## 2023-09-13 ENCOUNTER — Other Ambulatory Visit: Payer: Self-pay

## 2023-09-13 DIAGNOSIS — M19031 Primary osteoarthritis, right wrist: Secondary | ICD-10-CM | POA: Diagnosis present

## 2023-09-13 DIAGNOSIS — Z888 Allergy status to other drugs, medicaments and biological substances status: Secondary | ICD-10-CM

## 2023-09-13 DIAGNOSIS — F0390 Unspecified dementia without behavioral disturbance: Secondary | ICD-10-CM | POA: Diagnosis present

## 2023-09-13 DIAGNOSIS — R059 Cough, unspecified: Secondary | ICD-10-CM | POA: Diagnosis present

## 2023-09-13 DIAGNOSIS — F0394 Unspecified dementia, unspecified severity, with anxiety: Secondary | ICD-10-CM | POA: Diagnosis present

## 2023-09-13 DIAGNOSIS — L03113 Cellulitis of right upper limb: Principal | ICD-10-CM | POA: Diagnosis present

## 2023-09-13 DIAGNOSIS — E039 Hypothyroidism, unspecified: Secondary | ICD-10-CM | POA: Diagnosis not present

## 2023-09-13 DIAGNOSIS — Z885 Allergy status to narcotic agent status: Secondary | ICD-10-CM

## 2023-09-13 DIAGNOSIS — F0150 Vascular dementia without behavioral disturbance: Secondary | ICD-10-CM | POA: Diagnosis not present

## 2023-09-13 DIAGNOSIS — R627 Adult failure to thrive: Secondary | ICD-10-CM | POA: Diagnosis present

## 2023-09-13 DIAGNOSIS — H9190 Unspecified hearing loss, unspecified ear: Secondary | ICD-10-CM | POA: Diagnosis not present

## 2023-09-13 DIAGNOSIS — E785 Hyperlipidemia, unspecified: Secondary | ICD-10-CM | POA: Diagnosis present

## 2023-09-13 DIAGNOSIS — Z974 Presence of external hearing-aid: Secondary | ICD-10-CM | POA: Diagnosis not present

## 2023-09-13 DIAGNOSIS — G9341 Metabolic encephalopathy: Secondary | ICD-10-CM | POA: Diagnosis not present

## 2023-09-13 DIAGNOSIS — M7989 Other specified soft tissue disorders: Secondary | ICD-10-CM | POA: Diagnosis not present

## 2023-09-13 DIAGNOSIS — R404 Transient alteration of awareness: Principal | ICD-10-CM

## 2023-09-13 DIAGNOSIS — M1811 Unilateral primary osteoarthritis of first carpometacarpal joint, right hand: Secondary | ICD-10-CM | POA: Diagnosis not present

## 2023-09-13 DIAGNOSIS — Z96643 Presence of artificial hip joint, bilateral: Secondary | ICD-10-CM | POA: Diagnosis present

## 2023-09-13 DIAGNOSIS — Z9079 Acquired absence of other genital organ(s): Secondary | ICD-10-CM

## 2023-09-13 DIAGNOSIS — Z7982 Long term (current) use of aspirin: Secondary | ICD-10-CM

## 2023-09-13 DIAGNOSIS — Z1152 Encounter for screening for COVID-19: Secondary | ICD-10-CM | POA: Diagnosis not present

## 2023-09-13 DIAGNOSIS — N61 Mastitis without abscess: Secondary | ICD-10-CM | POA: Diagnosis present

## 2023-09-13 DIAGNOSIS — L0390 Cellulitis, unspecified: Secondary | ICD-10-CM | POA: Diagnosis not present

## 2023-09-13 DIAGNOSIS — Z6823 Body mass index (BMI) 23.0-23.9, adult: Secondary | ICD-10-CM

## 2023-09-13 DIAGNOSIS — A419 Sepsis, unspecified organism: Principal | ICD-10-CM | POA: Diagnosis present

## 2023-09-13 DIAGNOSIS — Z87891 Personal history of nicotine dependence: Secondary | ICD-10-CM | POA: Diagnosis not present

## 2023-09-13 DIAGNOSIS — I10 Essential (primary) hypertension: Secondary | ICD-10-CM | POA: Diagnosis not present

## 2023-09-13 DIAGNOSIS — J45909 Unspecified asthma, uncomplicated: Secondary | ICD-10-CM | POA: Diagnosis not present

## 2023-09-13 DIAGNOSIS — M009 Pyogenic arthritis, unspecified: Secondary | ICD-10-CM | POA: Diagnosis present

## 2023-09-13 DIAGNOSIS — I771 Stricture of artery: Secondary | ICD-10-CM | POA: Diagnosis not present

## 2023-09-13 DIAGNOSIS — Z743 Need for continuous supervision: Secondary | ICD-10-CM | POA: Diagnosis not present

## 2023-09-13 DIAGNOSIS — E876 Hypokalemia: Secondary | ICD-10-CM | POA: Diagnosis present

## 2023-09-13 DIAGNOSIS — F03B0 Unspecified dementia, moderate, without behavioral disturbance, psychotic disturbance, mood disturbance, and anxiety: Secondary | ICD-10-CM | POA: Diagnosis not present

## 2023-09-13 DIAGNOSIS — M25531 Pain in right wrist: Secondary | ICD-10-CM | POA: Diagnosis not present

## 2023-09-13 DIAGNOSIS — I672 Cerebral atherosclerosis: Secondary | ICD-10-CM | POA: Diagnosis not present

## 2023-09-13 DIAGNOSIS — Z79899 Other long term (current) drug therapy: Secondary | ICD-10-CM

## 2023-09-13 DIAGNOSIS — I6782 Cerebral ischemia: Secondary | ICD-10-CM | POA: Diagnosis not present

## 2023-09-13 DIAGNOSIS — R000 Tachycardia, unspecified: Secondary | ICD-10-CM | POA: Diagnosis not present

## 2023-09-13 DIAGNOSIS — Z79890 Hormone replacement therapy (postmenopausal): Secondary | ICD-10-CM

## 2023-09-13 DIAGNOSIS — R0902 Hypoxemia: Secondary | ICD-10-CM | POA: Diagnosis not present

## 2023-09-13 LAB — URINALYSIS, W/ REFLEX TO CULTURE (INFECTION SUSPECTED)
Bacteria, UA: NONE SEEN
Bilirubin Urine: NEGATIVE
Glucose, UA: NEGATIVE mg/dL
Hgb urine dipstick: NEGATIVE
Ketones, ur: 5 mg/dL — AB
Leukocytes,Ua: NEGATIVE
Nitrite: NEGATIVE
Protein, ur: 30 mg/dL — AB
Specific Gravity, Urine: 1.025 (ref 1.005–1.030)
pH: 5 (ref 5.0–8.0)

## 2023-09-13 LAB — CBC WITH DIFFERENTIAL/PLATELET
Abs Immature Granulocytes: 0.04 10*3/uL (ref 0.00–0.07)
Basophils Absolute: 0 10*3/uL (ref 0.0–0.1)
Basophils Relative: 0 %
Eosinophils Absolute: 0 10*3/uL (ref 0.0–0.5)
Eosinophils Relative: 0 %
HCT: 39.5 % (ref 39.0–52.0)
Hemoglobin: 12.8 g/dL — ABNORMAL LOW (ref 13.0–17.0)
Immature Granulocytes: 0 %
Lymphocytes Relative: 11 %
Lymphs Abs: 1.1 10*3/uL (ref 0.7–4.0)
MCH: 30.5 pg (ref 26.0–34.0)
MCHC: 32.4 g/dL (ref 30.0–36.0)
MCV: 94.3 fL (ref 80.0–100.0)
Monocytes Absolute: 0.8 10*3/uL (ref 0.1–1.0)
Monocytes Relative: 8 %
Neutro Abs: 8 10*3/uL — ABNORMAL HIGH (ref 1.7–7.7)
Neutrophils Relative %: 81 %
Platelets: 433 10*3/uL — ABNORMAL HIGH (ref 150–400)
RBC: 4.19 MIL/uL — ABNORMAL LOW (ref 4.22–5.81)
RDW: 14.6 % (ref 11.5–15.5)
WBC: 9.9 10*3/uL (ref 4.0–10.5)
nRBC: 0 % (ref 0.0–0.2)

## 2023-09-13 LAB — COMPREHENSIVE METABOLIC PANEL
ALT: 23 U/L (ref 0–44)
AST: 24 U/L (ref 15–41)
Albumin: 3.2 g/dL — ABNORMAL LOW (ref 3.5–5.0)
Alkaline Phosphatase: 90 U/L (ref 38–126)
Anion gap: 11 (ref 5–15)
BUN: 20 mg/dL (ref 8–23)
CO2: 26 mmol/L (ref 22–32)
Calcium: 8.8 mg/dL — ABNORMAL LOW (ref 8.9–10.3)
Chloride: 99 mmol/L (ref 98–111)
Creatinine, Ser: 0.99 mg/dL (ref 0.61–1.24)
GFR, Estimated: >60 mL/min (ref >60)
Glucose, Bld: 100 mg/dL — ABNORMAL HIGH (ref 70–99)
Potassium: 3.4 mmol/L — ABNORMAL LOW (ref 3.5–5.1)
Sodium: 136 mmol/L (ref 135–145)
Total Bilirubin: 0.6 mg/dL (ref 0.0–1.2)
Total Protein: 7.8 g/dL (ref 6.5–8.1)

## 2023-09-13 LAB — RESPIRATORY PANEL BY PCR

## 2023-09-13 LAB — I-STAT CG4 LACTIC ACID, ED: Lactic Acid, Venous: 1.2 mmol/L (ref 0.5–1.9)

## 2023-09-13 LAB — SARS CORONAVIRUS 2 BY RT PCR: SARS Coronavirus 2 by RT PCR: NEGATIVE

## 2023-09-13 LAB — AMMONIA: Ammonia: 14 umol/L (ref 9–35)

## 2023-09-13 LAB — PROTIME-INR
INR: 1.3 — ABNORMAL HIGH (ref 0.8–1.2)
Prothrombin Time: 16.4 s — ABNORMAL HIGH (ref 11.4–15.2)

## 2023-09-13 LAB — TSH: TSH: 0.583 u[IU]/mL (ref 0.350–4.500)

## 2023-09-13 LAB — URIC ACID: Uric Acid, Serum: 5.4 mg/dL (ref 3.7–8.6)

## 2023-09-13 MED ORDER — ACETAMINOPHEN 650 MG RE SUPP: 650.0000 mg | Freq: Four times a day (QID) | RECTAL | Status: DC | PRN

## 2023-09-13 MED ORDER — VANCOMYCIN HCL 1250 MG/250ML IV SOLN
1250.0000 mg | INTRAVENOUS | Status: DC
  Administered 2023-09-14 – 2023-09-16 (×3): 1250 mg via INTRAVENOUS
  Filled 2023-09-13 (×3): qty 250

## 2023-09-13 MED ORDER — POTASSIUM CHLORIDE CRYS ER 20 MEQ PO TBCR
40.0000 meq | EXTENDED_RELEASE_TABLET | Freq: Once | ORAL | Status: AC
  Administered 2023-09-13: 40 meq via ORAL
  Filled 2023-09-13: qty 2

## 2023-09-13 MED ORDER — SODIUM CHLORIDE 0.9 % IV SOLN: INTRAVENOUS | Status: AC

## 2023-09-13 MED ORDER — SODIUM CHLORIDE 0.9 % IV SOLN
3.0000 g | Freq: Four times a day (QID) | INTRAVENOUS | Status: DC
  Administered 2023-09-14 – 2023-09-20 (×26): 3 g via INTRAVENOUS
  Filled 2023-09-13 (×28): qty 8

## 2023-09-13 MED ORDER — ACETAMINOPHEN 325 MG PO TABS
650.0000 mg | ORAL_TABLET | Freq: Four times a day (QID) | ORAL | Status: DC | PRN
  Administered 2023-09-14 – 2023-09-19 (×3): 650 mg via ORAL
  Filled 2023-09-13 (×3): qty 2

## 2023-09-13 MED ORDER — SODIUM CHLORIDE 0.9 % IV SOLN
1.0000 g | Freq: Once | INTRAVENOUS | Status: AC
  Administered 2023-09-13: 1 g via INTRAVENOUS
  Filled 2023-09-13: qty 10

## 2023-09-13 MED ORDER — VANCOMYCIN HCL 1.5 G IV SOLR
1500.0000 mg | Freq: Once | INTRAVENOUS | Status: AC
  Administered 2023-09-13: 1500 mg via INTRAVENOUS
  Filled 2023-09-13 (×2): qty 30

## 2023-09-13 NOTE — ED Provider Notes (Signed)
 [place] [facility]ERGENC[facility] DEPARTMENT AT [facility] Provider Note   CSN: [id number] Arrival date & time: 09/13/23  1511     History Chief Complaint  Patient presents with   Cough   Failure To Thrive    HPI Erik Jackson is a 89 y.o. male presenting for altered mental status.   Patient's recorded medical, surgical, social, medication list and allergies were reviewed in the Snapshot window as part of the initial history.   Review of Systems   Review of Systems  Constitutional:  Positive for fatigue. Negative for chills and fever.  HENT:  Negative for ear pain and sore throat.   Eyes:  Negative for pain and visual disturbance.  Respiratory:  Negative for cough and shortness of breath.   Cardiovascular:  Negative for chest pain and palpitations.  Gastrointestinal:  Negative for abdominal pain and vomiting.  Genitourinary:  Negative for dysuria and hematuria.  Musculoskeletal:  Negative for arthralgias and back pain.  Skin:  Negative for color change and rash.  Neurological:  Negative for seizures and syncope.  Psychiatric/Behavioral:  Positive for confusion.   All other systems reviewed and are negative.   Physical Exam Updated Vital Signs BP 139/60 (BP Location: Left Arm)   Pulse 77   Temp 98.6 F (37 C) (Oral)   Resp 18   Ht 5' 9 (1.753 m)   Wt 72.1 kg   SpO2 97%   BMI 23.47 kg/m  Physical Exam Vitals and nursing note reviewed.  Constitutional:      General: He is not in acute distress.    Appearance: He is well-developed.  HENT:     Head: Normocephalic and atraumatic.  Eyes:     Conjunctiva/sclera: Conjunctivae normal.  Cardiovascular:     Rate and Rhythm: Normal rate and regular rhythm.     Heart sounds: No murmur heard. Pulmonary:     Effort: Pulmonary effort is normal. No respiratory distress.     Breath sounds: Normal breath sounds.  Abdominal:     Palpations: Abdomen is soft.     Tenderness: There is no abdominal tenderness.   Musculoskeletal:        General: No swelling.     Cervical back: Neck supple.  Skin:    General: Skin is warm and dry.     Capillary Refill: Capillary refill takes less than 2 seconds.  Neurological:     Mental Status: He is alert.  Psychiatric:        Mood and Affect: Mood normal.      ED Course/ Medical Decision Making/ A&P    Procedures Procedures   Medications Ordered in ED Medications  0.9 %  sodium chloride  infusion ( Intravenous New Bag/Given 09/13/23 2222)  acetaminophen  (TYLENOL ) tablet 650 mg (has no administration in time range)    Or  acetaminophen  (TYLENOL ) suppository 650 mg (has no administration in time range)  Ampicillin -Sulbactam (UNASYN ) 3 g in sodium chloride  0.9 % 100 mL IVPB (has no administration in time range)  Vancomycin  (VANCOCIN ) 1,500 mg in sodium chloride  0.9 % 500 mL IVPB (1,500 mg Intravenous New Bag/Given 09/13/23 2247)  vancomycin  (VANCOREADY) IVPB 1250 mg/250 mL (has no administration in time range)  cefTRIAXone  (ROCEPHIN ) 1 g in sodium chloride  0.9 % 100 mL IVPB (1 g Intravenous New Bag/Given 09/13/23 2005)  potassium chloride  SA (KLOR-CON  M) CR tablet 40 mEq (40 mEq Oral Given 09/13/23 2141)   Medical Decision Making:   Erik Jackson is a 89 y.o. male who  presented to the ED today with altered mental status detailed above.    Patient placed on continuous vitals and telemetry monitoring while in ED which was reviewed periodically.  Complete initial physical exam performed, notably the patient  was hds in NAD.    Reviewed and confirmed nursing documentation for past medical history, family history, social history.    Initial Assessment:   With the patient's presentation of altered mental status, most likely diagnosis is delerium 2/2 infectious etiology (UTI/CAP/URI) vs metabolic abnormality (Na/K/Mg/Ca) vs nonspecific etiology. Other diagnoses were considered including (but not limited to) CVA, ICH, intracranial mass, critical dehydration, heptatic  dysfunction, uremia, hypercarbia, intoxication, endrocrine abnormality, toxidrome. These are considered less likely due to history of present illness and physical exam findings.   This is most consistent with an acute life/limb threatening illness complicated by underlying chronic conditions.  Initial Plan:  Screening labs including CBC and Metabolic panel to evaluate for infectious or metabolic etiology of disease.  Urinalysis with reflex culture ordered to evaluate for UTI or relevant urologic/nephrologic pathology.  CXR to evaluate for structural/infectious intrathoracic pathology.  EKG to evaluate for cardiac pathology Objective evaluation as below reviewed   Initial Study Results:   Laboratory  All laboratory results reviewed without evidence of clinically relevant pathology.    EKG EKG was reviewed independently. Rate, rhythm, axis, intervals all examined and without medically relevant abnormality. ST segments without concerns for elevations.    Radiology:  All images reviewed independently. Agree with radiology report at this time.   CT HEAD WO CONTRAST ( ) Result Date: 09/13/2023 CLINICAL DATA:  Brought in by EMS due to failure to thrive, poor intake, incontinence, nonproductive cough. Baseline dementia. Mental status change. EXAM: CT HEAD WITHOUT CONTRAST TECHNIQUE: Contiguous axial images were obtained from the base of the skull through the vertex without intravenous contrast. RADIATION DOSE REDUCTION: This exam was performed according to the departmental dose-optimization program which includes automated exposure control, adjustment of the mA and/or kV according to patient size and/or use of iterative reconstruction technique. COMPARISON:  CT head 06/23/2022 FINDINGS: Brain: No intracranial hemorrhage, mass effect, or evidence of acute infarct. No hydrocephalus. No extra-axial fluid collection. Age-commensurate cerebral atrophy and chronic small vessel ischemic disease. Vascular: No  hyperdense vessel. Intracranial arterial calcification. Skull: No fracture or focal lesion. Sinuses/Orbits: No acute finding. Other: None. IMPRESSION: No acute intracranial abnormality. Electronically Signed   By: Norman Gatlin M.D.   On: 09/13/2023 19:08   DG Chest 2 View Result Date: 09/13/2023 CLINICAL DATA:  Sepsis EXAM: CHEST - 2 VIEW COMPARISON:  X-ray 08/06/2022.  Older exams as well. FINDINGS: Underinflation. No consolidation, pneumothorax or effusion. No edema. Normal cardiopericardial silhouette. Calcified tortuous aorta. Overlapping cardiac leads. Degenerative changes along the spine. Fixation hardware along the lower cervical spine. Degenerative changes of the shoulders. IMPRESSION: Underinflation.  No acute cardiopulmonary disease. Electronically Signed   By: Ranell Bring M.D.   On: 09/13/2023 18:22   DG Wrist Complete Right Result Date: 09/13/2023 CLINICAL DATA:  Right wrist pain, redness, and swelling. EXAM: RIGHT WRIST - COMPLETE 3+ VIEW COMPARISON:  None Available. FINDINGS: Mild thumb carpometacarpal joint space narrowing and peripheral osteophytosis. Mild calcification within the triangular fibrocartilage complex. No acute fracture or dislocation. Mild atherosclerotic calcifications. IMPRESSION: Mild thumb carpometacarpal osteoarthritis. Electronically Signed   By: Tanda Lyons M.D.   On: 09/13/2023 18:04     Final Assessment and Plan:   History of present illness physical exam findings are most consistent with nonspecific etiology.  With his fever on suspecting a cellulitis of his left wrist.  His RVP is still pending.  No other clear etiology of his symptoms.  Treating broadly for suspected cellulitis and arranging for admission for further care and management.    Clinical Impression:  1. Transient alteration of awareness      Admit   Final Clinical Impression(s) / ED Diagnoses Final diagnoses:  Transient alteration of awareness    Rx / DC Orders ED Discharge Orders      None         Jerral Meth, MD 09/14/23 [id number]
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
## 2023-09-13 NOTE — Progress Notes (Signed)
 Pharmacy Antibiotic Note  Erik Jackson is a 89 y.o. male admitted on 09/13/2023 with cellulitis/wound infection. Pharmacy has been consulted for Unasyn  and Vancomycin  dosing.  Plan: Unasyn  3g IV q6h Vancomycin  1500mg  IV x 1, then 1250mg  IV q24h Vancomycin  levels at steady state, as inidicated Monitor renal function, cultures, clinical course  Height: 5' 9 (175.3 cm) Weight: 72.1 kg (158 lb 15.2 oz) IBW/kg (Calculated) : 70.7  Temp (24hrs), Avg:100.5 F (38.1 C), Min:99.6 F (37.6 C), Max:101.3 F (38.5 C)  Recent Labs  Lab 09/13/23 1614 09/13/23 1628  WBC 9.9  --   CREATININE 0.99  --   LATICACIDVEN  --  1.2    Estimated Creatinine Clearance: 49.6 mL/min (by C-G formula based on SCr of 0.99 mg/dL).    Allergies  Allergen Reactions   Amlodipine     Other reaction(s): Edema   Hydrochlorothiazide  Other (See Comments)    Dizziness   Morphine  Other (See Comments)    Patient request not to be given this for pain   Tessalon  [Benzonatate ] Other (See Comments)    Unknown reaction    Antimicrobials this admission: 1/3 Ceftriaxone  x 1 1/3 Vancomycin  >> 1/4 Unasyn  >>  Dose adjustments this admission: --  Microbiology results: 1/3 BCx:  1/3 Respiratory panel:    Thank you for allowing pharmacy to be a part of this patient's care.   Adrielle Polakowski, PharmD, BCPS Clinical Pharmacist 09/13/2023 9:01 PM

## 2023-09-13 NOTE — ED Triage Notes (Signed)
 Patient brought in by EMS due to failure to thrive, not eating or drinking well, unable to control bowels anymore, nonproductive cough going on for past 3 days. Right wrist has redness and swelling and tender to touch, unknown how long has been there. Pt has baseline of dementia.
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
## 2023-09-14 DIAGNOSIS — R531 Weakness: Secondary | ICD-10-CM | POA: Diagnosis not present

## 2023-09-14 DIAGNOSIS — M19031 Primary osteoarthritis, right wrist: Secondary | ICD-10-CM | POA: Diagnosis present

## 2023-09-14 DIAGNOSIS — S0181XA Laceration without foreign body of other part of head, initial encounter: Secondary | ICD-10-CM | POA: Diagnosis not present

## 2023-09-14 DIAGNOSIS — E785 Hyperlipidemia, unspecified: Secondary | ICD-10-CM | POA: Diagnosis not present

## 2023-09-14 DIAGNOSIS — F0150 Vascular dementia without behavioral disturbance: Secondary | ICD-10-CM

## 2023-09-14 DIAGNOSIS — R0600 Dyspnea, unspecified: Secondary | ICD-10-CM | POA: Diagnosis not present

## 2023-09-14 DIAGNOSIS — R404 Transient alteration of awareness: Secondary | ICD-10-CM

## 2023-09-14 DIAGNOSIS — Z96643 Presence of artificial hip joint, bilateral: Secondary | ICD-10-CM | POA: Diagnosis present

## 2023-09-14 DIAGNOSIS — F03B0 Unspecified dementia, moderate, without behavioral disturbance, psychotic disturbance, mood disturbance, and anxiety: Secondary | ICD-10-CM | POA: Diagnosis not present

## 2023-09-14 DIAGNOSIS — L0390 Cellulitis, unspecified: Secondary | ICD-10-CM | POA: Diagnosis not present

## 2023-09-14 DIAGNOSIS — L03113 Cellulitis of right upper limb: Secondary | ICD-10-CM | POA: Diagnosis not present

## 2023-09-14 DIAGNOSIS — R059 Cough, unspecified: Secondary | ICD-10-CM | POA: Diagnosis not present

## 2023-09-14 DIAGNOSIS — Z79890 Hormone replacement therapy (postmenopausal): Secondary | ICD-10-CM | POA: Diagnosis not present

## 2023-09-14 DIAGNOSIS — Z974 Presence of external hearing-aid: Secondary | ICD-10-CM | POA: Diagnosis not present

## 2023-09-14 DIAGNOSIS — Z9079 Acquired absence of other genital organ(s): Secondary | ICD-10-CM | POA: Diagnosis not present

## 2023-09-14 DIAGNOSIS — A419 Sepsis, unspecified organism: Secondary | ICD-10-CM | POA: Diagnosis present

## 2023-09-14 DIAGNOSIS — F0394 Unspecified dementia, unspecified severity, with anxiety: Secondary | ICD-10-CM | POA: Diagnosis present

## 2023-09-14 DIAGNOSIS — J45909 Unspecified asthma, uncomplicated: Secondary | ICD-10-CM | POA: Diagnosis not present

## 2023-09-14 DIAGNOSIS — I10 Essential (primary) hypertension: Secondary | ICD-10-CM | POA: Diagnosis not present

## 2023-09-14 DIAGNOSIS — E039 Hypothyroidism, unspecified: Secondary | ICD-10-CM | POA: Diagnosis not present

## 2023-09-14 DIAGNOSIS — M009 Pyogenic arthritis, unspecified: Secondary | ICD-10-CM | POA: Diagnosis present

## 2023-09-14 DIAGNOSIS — Z87891 Personal history of nicotine dependence: Secondary | ICD-10-CM | POA: Diagnosis not present

## 2023-09-14 DIAGNOSIS — Z6823 Body mass index (BMI) 23.0-23.9, adult: Secondary | ICD-10-CM | POA: Diagnosis not present

## 2023-09-14 DIAGNOSIS — G9341 Metabolic encephalopathy: Secondary | ICD-10-CM

## 2023-09-14 DIAGNOSIS — N61 Mastitis without abscess: Secondary | ICD-10-CM | POA: Diagnosis present

## 2023-09-14 DIAGNOSIS — Z7982 Long term (current) use of aspirin: Secondary | ICD-10-CM | POA: Diagnosis not present

## 2023-09-14 DIAGNOSIS — Z888 Allergy status to other drugs, medicaments and biological substances status: Secondary | ICD-10-CM | POA: Diagnosis not present

## 2023-09-14 DIAGNOSIS — Z885 Allergy status to narcotic agent status: Secondary | ICD-10-CM | POA: Diagnosis not present

## 2023-09-14 DIAGNOSIS — E876 Hypokalemia: Secondary | ICD-10-CM | POA: Diagnosis present

## 2023-09-14 DIAGNOSIS — R627 Adult failure to thrive: Secondary | ICD-10-CM | POA: Diagnosis not present

## 2023-09-14 DIAGNOSIS — H9190 Unspecified hearing loss, unspecified ear: Secondary | ICD-10-CM | POA: Diagnosis present

## 2023-09-14 DIAGNOSIS — Z743 Need for continuous supervision: Secondary | ICD-10-CM | POA: Diagnosis not present

## 2023-09-14 DIAGNOSIS — Z1152 Encounter for screening for COVID-19: Secondary | ICD-10-CM | POA: Diagnosis not present

## 2023-09-14 LAB — BASIC METABOLIC PANEL WITH GFR
Anion gap: 9 (ref 5–15)
BUN: 21 mg/dL (ref 8–23)
CO2: 23 mmol/L (ref 22–32)
Calcium: 8.3 mg/dL — ABNORMAL LOW (ref 8.9–10.3)
Chloride: 106 mmol/L (ref 98–111)
Creatinine, Ser: 0.85 mg/dL (ref 0.61–1.24)
GFR, Estimated: >60 mL/min
Glucose, Bld: 83 mg/dL (ref 70–99)
Potassium: 3.9 mmol/L (ref 3.5–5.1)
Sodium: 138 mmol/L (ref 135–145)

## 2023-09-14 LAB — SEDIMENTATION RATE: Sed Rate: 97 mm/h — ABNORMAL HIGH (ref 0–16)

## 2023-09-14 LAB — CBC
HCT: 33.7 % — ABNORMAL LOW (ref 39.0–52.0)
Hemoglobin: 11 g/dL — ABNORMAL LOW (ref 13.0–17.0)
MCH: 31 pg (ref 26.0–34.0)
MCHC: 32.6 g/dL (ref 30.0–36.0)
MCV: 94.9 fL (ref 80.0–100.0)
Platelets: 373 10*3/uL (ref 150–400)
RBC: 3.55 MIL/uL — ABNORMAL LOW (ref 4.22–5.81)
RDW: 14.7 % (ref 11.5–15.5)
WBC: 8.3 10*3/uL (ref 4.0–10.5)
nRBC: 0 % (ref 0.0–0.2)

## 2023-09-14 LAB — C-REACTIVE PROTEIN: CRP: 19.7 mg/dL — ABNORMAL HIGH (ref <1.0)

## 2023-09-14 LAB — MAGNESIUM: Magnesium: 1.8 mg/dL (ref 1.7–2.4)

## 2023-09-14 LAB — VITAMIN B12: Vitamin B-12: 278 pg/mL (ref 180–914)

## 2023-09-14 MED ORDER — ROSUVASTATIN CALCIUM 20 MG PO TABS
20.0000 mg | ORAL_TABLET | Freq: Every day | ORAL | Status: DC
Start: 2023-09-14 — End: 2023-09-20
  Administered 2023-09-14 – 2023-09-20 (×7): 20 mg via ORAL
  Filled 2023-09-14 (×7): qty 1

## 2023-09-14 MED ORDER — DONEPEZIL HCL 10 MG PO TABS
5.0000 mg | ORAL_TABLET | Freq: Every day | ORAL | Status: DC
  Administered 2023-09-14 – 2023-09-19 (×6): 5 mg via ORAL
  Filled 2023-09-14 (×6): qty 1

## 2023-09-14 MED ORDER — LEVOTHYROXINE SODIUM 75 MCG PO TABS
75.0000 ug | ORAL_TABLET | Freq: Every day | ORAL | Status: DC
  Administered 2023-09-15 – 2023-09-20 (×6): 75 ug via ORAL
  Filled 2023-09-14 (×6): qty 1
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
## 2023-09-14 NOTE — Progress Notes (Signed)
 Patients son Tim(HCPOA)  called to check the status of TOC consult and referral to SNF. This Clinical research associate spoke with him and verified that at this time [facility] consult has not been completed. Plan of care ongoing.

## 2023-09-14 NOTE — Evaluation (Signed)
 Physical Therapy Evaluation Patient Details Name: Erik Jackson MRN: [id number] DOB: 1931-04-04 Today's Date: 09/14/2023  History of Present Illness  Erik Jackson is a 89 y.o. male with medical history significant of dementia, hypertension, hyperlipidemia, hypothyroidism, asthma, allergic rhinitis, anxiety, B12 deficiency, chronic dermatitis, diverticulosis, history of TURP presented to ED via EMS from home for evaluation of generalized weakness, increased somnolence, cough, and not eating/drinking x 3 days.  Patient also noted to have redness and swelling of his right wrist and family reported to ED physician that patient fell asleep with his wrist hyperextended a few days ago and was found with his teeth digging into the skin of this wrist.  Clinical Impression  Pt admitted with above diagnosis.  Pt currently with functional limitations due to the deficits listed below (see PT Problem List). Pt will benefit from acute skilled PT to increase their independence and safety with mobility to allow discharge.  Pt able to ambulate in hallway with CGA of 2 and IV pole. At times communication is difficulty due to [facility] (hearing aids present, but not working) and dementia.  Recommend 24/7 S if family can provide with HHPT.  If not, then SNF should be considered.  May be a good candidate for memory care in future.         If plan is discharge home, recommend the following: A little help with walking and/or transfers;A little help with bathing/dressing/bathroom;Assistance with cooking/housework;Direct supervision/assist for financial management;Direct supervision/assist for medications management;Supervision due to cognitive status   Can travel by private vehicle        Equipment Recommendations None recommended by PT  Recommendations for Other Services       Functional Status Assessment Patient has had a recent decline in their functional status and demonstrates the ability to make significant  improvements in function in a reasonable and predictable amount of time.     Precautions / Restrictions Precautions Precautions: Fall Precaution Comments: R wrist tenderness Restrictions Weight Bearing Restrictions Per Provider Order: No      Mobility  Bed Mobility Overal bed mobility: Needs Assistance Bed Mobility: Supine to Sit     Supine to sit: Contact guard     General bed mobility comments: contact guard to prevent falling backwards    Transfers Overall transfer level: Needs assistance Equipment used: None Transfers: Sit to/from Stand Sit to Stand: Contact guard assist           General transfer comment: no reports of dizziness    Ambulation/Gait Ambulation/Gait assistance: Contact guard assist, +2 safety/equipment Gait Distance (Feet): 120 Feet Assistive device: IV Pole Gait Pattern/deviations: Decreased step length - left, Decreased step length - right, Knee flexed in stance - left Gait velocity: decreased     General Gait Details: Holding IV pole with L hand and R arm holding slightly guarded.  CGA of 2, but no overt LOB or falls.  Stairs            Wheelchair Mobility     Tilt Bed    Modified Rankin (Stroke Patients Only)       Balance Overall balance assessment: Mild deficits observed, not formally tested                                           Pertinent Vitals/Pain Pain Assessment Pain Assessment: Faces Faces Pain Scale: Hurts whole lot Pain Location: R wrist  with certain passive movements with OT Pain Descriptors / Indicators: Grimacing, Guarding Pain Intervention(s): Monitored during session, Limited activity within patient's tolerance, Repositioned    Home Living Family/patient expects to be discharged to:: Unsure                        Prior Function               Mobility Comments: Per chart review, appears pt is at home- unsure if with son or alone.  Was at ALF until August.        Extremity/Trunk Assessment   Upper Extremity Assessment Upper Extremity Assessment: Defer to OT evaluation    Lower Extremity Assessment Lower Extremity Assessment: Generalized weakness       Communication   Communication Communication: Hearing impairment (hearing aids in room and placed on him, but he said they needed to be charged so removed and placed back in pink container.)  Cognition Arousal: Alert Behavior During Therapy: WFL for tasks assessed/performed Overall Cognitive Status: History of cognitive impairments - at baseline                                          General Comments General comments (skin integrity, edema, etc.): R wrist sligthly swollen and red    Exercises     Assessment/Plan    PT Assessment Patient needs continued PT services  PT Problem List Decreased strength;Decreased activity tolerance;Decreased balance;Decreased mobility;Decreased cognition       PT Treatment Interventions Gait training;Functional mobility training;Therapeutic activities;Therapeutic exercise;Patient/family education    PT Goals (Current goals can be found in the Care Plan section)  Acute Rehab PT Goals Patient Stated Goal: Agreeable to walk PT Goal Formulation: Patient unable to participate in goal setting Time For Goal Achievement: 09/28/23 Potential to Achieve Goals: Good    Frequency Min 1X/week     Co-evaluation PT/OT/SLP Co-Evaluation/Treatment: Yes Reason for Co-Treatment: For patient/therapist safety PT goals addressed during session: Mobility/safety with mobility;Balance         AM-PAC PT 6 Clicks Mobility  Outcome Measure Help needed turning from your back to your side while in a flat bed without using bedrails?: A Little Help needed moving from lying on your back to sitting on the side of a flat bed without using bedrails?: A Little Help needed moving to and from a bed to a chair (including a wheelchair)?: A Little Help  needed standing up from a chair using your arms (e.g., wheelchair or bedside chair)?: A Little Help needed to walk in hospital room?: A Little Help needed climbing 3-5 steps with a railing? : A Little 6 Click Score: 18    End of Session Equipment Utilized During Treatment: Gait belt Activity Tolerance: Patient tolerated treatment well Patient left: in chair;with call bell/phone within reach;with nursing/sitter in room (NT working on getting new batteries for alarm box) Nurse Communication: Mobility status (nurse tech) PT Visit Diagnosis: Unsteadiness on feet (R26.81);Muscle weakness (generalized) (M62.81);Difficulty in walking, not elsewhere classified (R26.2)    Time: 8980-8954 PT Time Calculation (min) (ACUTE ONLY): 26 min   Charges:   PT Evaluation $PT Eval Moderate Complexity: 1 Mod   PT General Charges $$ ACUTE PT VISIT: 1 Visit         Darice RAMAN., PT Office [phone] Acute Rehab 09/14/2023   Darice LITTIE Sharps 09/14/2023, 12:08 PM
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
## 2023-09-14 NOTE — Progress Notes (Signed)
 [facility]                                                                              Erik Jackson, is a 89 y.o. male, DOB - 03-04-1931, FMW:[id number] Admit date - 09/13/2023    Outpatient Primary MD for the patient is Erik Lynwood ORN, MD  LOS - 0  days  Chief Complaint  Patient presents with   Cough   Failure To Thrive       Brief summary   Patient is a 89 year old male with dementia, HTN, hypothyroidism, hyperlipidemia, asthma anxiety, TURP presented to ED for generalized weakness, increased somnolence, cough and not eating or drinking for the last 3 days.  Patient was noted to have redness and swelling of his right wrist.  Family reported to ED physician that patient had had fallen asleep with his wrist hyperextended a few days ago and was found with his teeth digging into the skin of his wrist. In ED, temp 101.3 F, HR 93, RR 18, BP 140/75, O2 sats 94% on room air. UA negative for UTI, lactic acid normal, COVID negative. Chest x-ray showed no acute cardiopulmonary disease. X-ray of the right wrist showed mild thumb carpometacarpal osteoarthritis and no acute fracture or dislocation. CT head negative for acute intracranial abnormality. Patient was admitted for further workup.  Assessment & Plan    Principal Problem: Right wrist cellulitis, ?  Septic arthritis -Presented with fevers, tachycardia, altered mental status.  Right wrist with erythema extending to the dorsum of the hand, swelling, tender to touch, ROM decreased due to pain -CRP 19.7, uric acid 5.4, ESR 97 -Continue IV vancomycin , Unasyn  -Requested hand surgery, Dr. Ahmad to evaluate, picture placed below in the physical exam section -Follow blood cultures  Active Problems:   Acute metabolic encephalopathy in the setting of dementia -Family reported increased somnolence over the past few days -CT head showed no acute intracranial abnormality, patient not cooperative with neuro exam -Hold home  Seroquel , UA negative for UTI -Mental status could be worse due to acute infection/right wrist cellulitis?septic arthritis, continue antibiotics -TSH 0.58, B12 278 -Respiratory virus panel negative, COVID-19 negative, UA negative for UTI, CXR negative for infiltrates -Continue Aricept    FTT, generalized weakness, poor oral intake -Encourage p.o. diet, PT evaluation once more alert and oriented or closer to baseline    Hypertension -BP soft, hold lisinopril      Hyperlipidemia -Continue Crestor     Hypothyroidism -Continue levothyroxine  75 mcg daily  Estimated body mass index is 23.47 kg/m as calculated from the following:   Height as of this encounter: 5' 9 (1.753 m).   Weight as of this encounter: 72.1 kg.  Code Status: Full CODE STATUS DVT Prophylaxis:  SCDs Start: 09/13/23 2054   Level of Care: Level of care: Telemetry Family Communication: Disposition Plan:      Remains inpatient appropriate:      Procedures:    Consultants:   Hand surgery  Antimicrobials:   Anti-infectives (From admission, onward)    Start     Dose/Rate Route Frequency Ordered Stop   09/14/23 2200  vancomycin  (VANCOREADY) IVPB 1250 mg/250 mL  1,250 mg 166.7 mL/hr over 90 Minutes Intravenous Every 24 hours 09/13/23 2106     09/14/23 0000  Ampicillin -Sulbactam (UNASYN ) 3 g in sodium chloride  0.9 % 100 mL IVPB        3 g 200 mL/hr over 30 Minutes Intravenous Every 6 hours 09/13/23 2101     09/13/23 2115  Vancomycin  (VANCOCIN ) 1,500 mg in sodium chloride  0.9 % 500 mL IVPB        1,500 mg 250 mL/hr over 120 Minutes Intravenous  Once 09/13/23 2106 09/14/23 0047   09/13/23 2000  cefTRIAXone  (ROCEPHIN ) 1 g in sodium chloride  0.9 % 100 mL IVPB        1 g 200 mL/hr over 30 Minutes Intravenous  Once 09/13/23 1949 09/13/23 2035          Medications  donepezil   5 mg Oral QHS   [START ON 09/15/2023] levothyroxine   75 mcg Oral QAC breakfast   rosuvastatin   20 mg Oral Daily       Subjective:   [name] was seen and examined today.  Awake but not cooperative with exam.  Right wrist tender, redness and swollen.  Difficult to obtain ROS from the patient.  No acute chest pain, shortness of breath, nausea or vomiting.  Objective:   Vitals:   09/13/23 2210 09/14/23 0340 09/14/23 0533 09/14/23 0951  BP: 139/60 (!) 111/49 (!) 91/52 (!) 111/52  Pulse: 77 78 71 77  Resp: 18 18 18 14   Temp: 98.6 F (37 C) 98.1 F (36.7 C) 98.4 F (36.9 C) 97.6 F (36.4 C)  TempSrc: Oral Oral Oral   SpO2: 97% 96% 92% 97%  Weight:      Height:        Intake/Output Summary (Last 24 hours) at 09/14/2023 1055 Last data filed at 09/14/2023 0819 Gross per 24 hour  Intake 1359.73 ml  Output 325 ml  Net 1034.73 ml     Wt Readings from Last 3 Encounters:  09/13/23 72.1 kg  04/30/23 72.1 kg  03/06/23 72.1 kg     Exam General: Alert and awake, dementia Cardiovascular: S1 S2 auscultated,  RRR Respiratory: Clear to auscultation bilaterally, no wheezing Gastrointestinal: Soft, nontender, nondistended, + bowel sounds Ext: no pedal edema bilaterally, right wrist TTP Neuro: not cooperative with the neuroexam Skin: right wrist swelling, erythema, tender to touch Psych: dementia    Data Reviewed:  I have personally reviewed following labs    CBC Lab Results  Component Value Date   WBC 8.3 09/14/2023   RBC 3.55 (L) 09/14/2023   HGB 11.0 (L) 09/14/2023   HCT 33.7 (L) 09/14/2023   MCV 94.9 09/14/2023   MCH 31.0 09/14/2023   PLT 373 09/14/2023   MCHC 32.6 09/14/2023   RDW 14.7 09/14/2023   LYMPHSABS 1.1 09/13/2023   MONOABS 0.8 09/13/2023   EOSABS 0.0 09/13/2023   BASOSABS 0.0 09/13/2023     Last metabolic panel Lab Results  Component Value Date   NA 138 09/14/2023   K 3.9 09/14/2023   CL 106 09/14/2023   CO2 23 09/14/2023   BUN 21 09/14/2023   CREATININE 0.85 09/14/2023   GLUCOSE 83 09/14/2023   GFRNONAA >60 09/14/2023   GFRAA >60 02/04/2016    CALCIUM  8.3 (L) 09/14/2023   PHOS 4.0 06/24/2022   PROT 7.8 09/13/2023   ALBUMIN 3.2 (L) 09/13/2023   BILITOT 0.6 09/13/2023   ALKPHOS 90 09/13/2023   AST 24 09/13/2023   ALT 23 09/13/2023   ANIONGAP 9 09/14/2023  CBG (last 3)  No results for input(s): GLUCAP in the last 72 hours.    Coagulation Profile: Recent Labs  Lab 09/13/23 1614  INR 1.3*     Radiology Studies: I have personally reviewed the imaging studies  CT HEAD WO CONTRAST ( ) Result Date: 09/13/2023 CLINICAL DATA:  Brought in by EMS due to failure to thrive, poor intake, incontinence, nonproductive cough. Baseline dementia. Mental status change. EXAM: CT HEAD WITHOUT CONTRAST TECHNIQUE: Contiguous axial images were obtained from the base of the skull through the vertex without intravenous contrast. RADIATION DOSE REDUCTION: This exam was performed according to the departmental dose-optimization program which includes automated exposure control, adjustment of the mA and/or kV according to patient size and/or use of iterative reconstruction technique. COMPARISON:  CT head 06/23/2022 FINDINGS: Brain: No intracranial hemorrhage, mass effect, or evidence of acute infarct. No hydrocephalus. No extra-axial fluid collection. Age-commensurate cerebral atrophy and chronic small vessel ischemic disease. Vascular: No hyperdense vessel. Intracranial arterial calcification. Skull: No fracture or focal lesion. Sinuses/Orbits: No acute finding. Other: None. IMPRESSION: No acute intracranial abnormality. Electronically Signed   By: Norman Gatlin M.D.   On: 09/13/2023 19:08   DG Chest 2 View Result Date: 09/13/2023 CLINICAL DATA:  Sepsis EXAM: CHEST - 2 VIEW COMPARISON:  X-ray 08/06/2022.  Older exams as well. FINDINGS: Underinflation. No consolidation, pneumothorax or effusion. No edema. Normal cardiopericardial silhouette. Calcified tortuous aorta. Overlapping cardiac leads. Degenerative changes along the spine. Fixation hardware along  the lower cervical spine. Degenerative changes of the shoulders. IMPRESSION: Underinflation.  No acute cardiopulmonary disease. Electronically Signed   By: Ranell Bring M.D.   On: 09/13/2023 18:22   DG Wrist Complete Right Result Date: 09/13/2023 CLINICAL DATA:  Right wrist pain, redness, and swelling. EXAM: RIGHT WRIST - COMPLETE 3+ VIEW COMPARISON:  None Available. FINDINGS: Mild thumb carpometacarpal joint space narrowing and peripheral osteophytosis. Mild calcification within the triangular fibrocartilage complex. No acute fracture or dislocation. Mild atherosclerotic calcifications. IMPRESSION: Mild thumb carpometacarpal osteoarthritis. Electronically Signed   By: Tanda Lyons M.D.   On: 09/13/2023 18:04       Antar Milks M.D. [facility] 09/14/2023, 10:55 AM  Available via Epic secure chat 7am-7pm After 7 pm, please refer to night coverage provider listed on amion.  Oh this is Dr. Davia

## 2023-09-15 ENCOUNTER — Inpatient Hospital Stay (HOSPITAL_COMMUNITY): Payer: Medicare Other

## 2023-09-15 DIAGNOSIS — L03113 Cellulitis of right upper limb: Secondary | ICD-10-CM

## 2023-09-15 DIAGNOSIS — R404 Transient alteration of awareness: Secondary | ICD-10-CM | POA: Diagnosis not present

## 2023-09-15 DIAGNOSIS — G9341 Metabolic encephalopathy: Secondary | ICD-10-CM | POA: Diagnosis not present

## 2023-09-15 LAB — BASIC METABOLIC PANEL
Anion gap: 9 (ref 5–15)
BUN: 21 mg/dL (ref 8–23)
CO2: 24 mmol/L (ref 22–32)
Calcium: 8.4 mg/dL — ABNORMAL LOW (ref 8.9–10.3)
Chloride: 106 mmol/L (ref 98–111)
Creatinine, Ser: 0.86 mg/dL (ref 0.61–1.24)
GFR, Estimated: >60 mL/min (ref >60)
Glucose, Bld: 82 mg/dL (ref 70–99)
Potassium: 3.7 mmol/L (ref 3.5–5.1)
Sodium: 139 mmol/L (ref 135–145)

## 2023-09-15 LAB — CBC
HCT: 33.3 % — ABNORMAL LOW (ref 39.0–52.0)
Hemoglobin: 10.6 g/dL — ABNORMAL LOW (ref 13.0–17.0)
MCH: 30.5 pg (ref 26.0–34.0)
MCHC: 31.8 g/dL (ref 30.0–36.0)
MCV: 96 fL (ref 80.0–100.0)
Platelets: 331 10*3/uL (ref 150–400)
RBC: 3.47 MIL/uL — ABNORMAL LOW (ref 4.22–5.81)
RDW: 14.9 % (ref 11.5–15.5)
WBC: 8.1 10*3/uL (ref 4.0–10.5)
nRBC: 0 % (ref 0.0–0.2)

## 2023-09-15 MED ORDER — GUAIFENESIN ER 600 MG PO TB12
600.0000 mg | ORAL_TABLET | Freq: Two times a day (BID) | ORAL | Status: DC
  Administered 2023-09-15 – 2023-09-20 (×10): 600 mg via ORAL
  Filled 2023-09-15 (×10): qty 1

## 2023-09-15 MED ORDER — IPRATROPIUM-ALBUTEROL 0.5-2.5 (3) MG/3ML IN SOLN: 3.0000 mL | Freq: Four times a day (QID) | RESPIRATORY_TRACT | Status: DC | PRN

## 2023-09-15 MED ORDER — IPRATROPIUM-ALBUTEROL 0.5-2.5 (3) MG/3ML IN SOLN
3.0000 mL | Freq: Three times a day (TID) | RESPIRATORY_TRACT | Status: DC
  Filled 2023-09-15: qty 3

## 2023-09-15 NOTE — Progress Notes (Signed)
 Orthopedic Tech Progress Note Patient Details:  Erik Jackson March 22, 1931 [id number]  Ortho Devices Type of Ortho Device: Velcro wrist splint Ortho Device/Splint Location: RUE Ortho Device/Splint Interventions: Ordered, Application, Adjustment   Post Interventions Patient Tolerated: Well  [name] 09/15/2023, 10:06 AM
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
## 2023-09-15 NOTE — Progress Notes (Addendum)
 [facility]                                                                              Fabrizio Filip, is a 89 y.o. male, DOB - [birth year], FMW:[id number] Admit date - 09/13/2023    Outpatient Primary MD for the patient is [name], MD  LOS - 1  days  Chief Complaint  Patient presents with   Cough   Failure To Thrive       Brief summary   Patient is a 89 year old male with dementia, HTN, hypothyroidism, hyperlipidemia, asthma anxiety, TURP presented to ED for generalized weakness, increased somnolence, cough and not eating or drinking for the last 3 days.  Patient was noted to have redness and swelling of his right wrist.  Family reported to ED physician that patient had had fallen asleep with his wrist hyperextended a few days ago and was found with his teeth digging into the skin of his wrist. In ED, temp 101.3 F, HR 93, RR 18, BP 140/75, O2 sats 94% on room air. UA negative for UTI, lactic acid normal, COVID negative. Chest x-ray showed no acute cardiopulmonary disease. X-ray of the right wrist showed mild thumb carpometacarpal osteoarthritis and no acute fracture or dislocation. CT head negative for acute intracranial abnormality. Patient was admitted for further workup.  Assessment & Plan    Principal Problem: Right wrist cellulitis, ?  Septic arthritis -Presented with fevers, tachycardia, altered mental status.  Right wrist with erythema extending to the dorsum of the hand, swelling, tender to touch, ROM decreased due to pain -CRP 19.7, uric acid 5.4, ESR 97 -Evaluated by hand surgery, Dr. Ahmad, does not appear to be septic arthritis and does not I&D -Redness, swelling, tenderness improving today, will continue IV antibiotics, icing until the forearm wrist brace is placed  Active Problems:   Acute metabolic encephalopathy in the setting of dementia -Family reported increased somnolence over the past few days -CT head showed no acute intracranial  abnormality, patient not cooperative with neuro exam -Hold home Seroquel , UA negative for UTI -TSH 0.58, B12 278 -Respiratory virus panel negative, COVID-19 negative, UA negative for UTI, CXR negative for infiltrates -Continue Aricept  -Much more alert and oriented today, pain in the wrist is improving, appears close to his baseline  Cough with possible aspiration -Noted rhonchi bilaterally and coughing, appears to have some aspiration -Follow chest x-ray, SLP evaluation -Currently on IV Unasyn , placed on Mucinex  -Placed on dysphagia 3 diet, await SLP evaluation   FTT, generalized weakness, poor oral intake -Encourage p.o. diet, PT evaluation once more alert and oriented or closer to baseline -Family requesting SNF, TOC consult placed    Hypertension -BP soft, hold lisinopril      Hyperlipidemia -Continue Crestor     Hypothyroidism -Continue levothyroxine  75 mcg daily  Estimated body mass index is 23.47 kg/m as calculated from the following:   Height as of this encounter: 5' 9 (1.753 m).   Weight as of this encounter: 72.1 kg.  Code Status: Full CODE STATUS DVT Prophylaxis:  SCDs Start: 09/13/23 2054   Level of Care: Level of care: Med-Surg Family Communication: Disposition Plan:  Remains inpatient appropriate:      Procedures:    Consultants:   Hand surgery  Antimicrobials:   Anti-infectives (From admission, onward)    Start     Dose/Rate Route Frequency Ordered Stop   09/14/23 2200  vancomycin  (VANCOREADY) IVPB 1250 mg/250 mL        1,250 mg 166.7 mL/hr over 90 Minutes Intravenous Every 24 hours 09/13/23 2106     09/14/23 0000  Ampicillin -Sulbactam (UNASYN ) 3 g in sodium chloride  0.9 % 100 mL IVPB        3 g 200 mL/hr over 30 Minutes Intravenous Every 6 hours 09/13/23 2101     09/13/23 2115  Vancomycin  (VANCOCIN ) 1,500 mg in sodium chloride  0.9 % 500 mL IVPB        1,500 mg 250 mL/hr over 120 Minutes Intravenous  Once 09/13/23 2106 09/14/23 0047    09/13/23 2000  cefTRIAXone  (ROCEPHIN ) 1 g in sodium chloride  0.9 % 100 mL IVPB        1 g 200 mL/hr over 30 Minutes Intravenous  Once 09/13/23 1949 09/13/23 2035          Medications  donepezil   5 mg Oral QHS   levothyroxine   75 mcg Oral QAC breakfast   rosuvastatin   20 mg Oral Daily      Subjective:   Erik Jackson was seen and examined today.  Much more alert and oriented today, cooperative with exam.  Sitting up in the chair eating breakfast at the time of encounter.  Swelling and redness on the right wrist improving, patient was able to move the wrist and hand better today.  No fevers.  No acute chest pain, shortness of breath, nausea or vomiting.  Objective:   Vitals:   09/14/23 1351 09/14/23 1738 09/14/23 2051 09/15/23 0547  BP: 125/82 135/61 117/63 (!) 133/57  Pulse: 66 75 72 (!) 55  Resp: 18 16 16 16   Temp: 98.8 F (37.1 C) 100 F (37.8 C) 99.5 F (37.5 C) 97.7 F (36.5 C)  TempSrc:   Oral Oral  SpO2: 97% 99% 93% 99%  Weight:      Height:        Intake/Output Summary (Last 24 hours) at 09/15/2023 1253 Last data filed at 09/15/2023 1032 Gross per 24 hour  Intake 1167.53 ml  Output 425 ml  Net 742.53 ml     Wt Readings from Last 3 Encounters:  09/13/23 72.1 kg  04/30/23 72.1 kg  03/06/23 72.1 kg   Physical Exam General: Alert and oriented, NAD, sitting up in the chair, coughing Cardiovascular: S1 S2 clear, RRR.  Respiratory: Coarse breath sounds bilaterally Gastrointestinal: Soft, nontender, nondistended, NBS Ext: no pedal edema bilaterally Neuro: no new deficits Skin: Right wrist swelling, redness and tenderness improving today Psych: Normal affect, pleasant   On 09/14/2023   Data Reviewed:  I have personally reviewed following labs    CBC Lab Results  Component Value Date   WBC 8.1 09/15/2023   RBC 3.47 (L) 09/15/2023   HGB 10.6 (L) 09/15/2023   HCT 33.3 (L) 09/15/2023   MCV 96.0 09/15/2023   MCH 30.5 09/15/2023   PLT 331 09/15/2023    MCHC 31.8 09/15/2023   RDW 14.9 09/15/2023   LYMPHSABS 1.1 09/13/2023   MONOABS 0.8 09/13/2023   EOSABS 0.0 09/13/2023   BASOSABS 0.0 09/13/2023     Last metabolic panel Lab Results  Component Value Date   NA 139 09/15/2023   K 3.7 09/15/2023   CL 106 09/15/2023  CO2 24 09/15/2023   BUN 21 09/15/2023   CREATININE 0.86 09/15/2023   GLUCOSE 82 09/15/2023   GFRNONAA >60 09/15/2023   GFRAA >60 02/04/2016   CALCIUM  8.4 (L) 09/15/2023   PHOS 4.0 06/24/2022   PROT 7.8 09/13/2023   ALBUMIN 3.2 (L) 09/13/2023   BILITOT 0.6 09/13/2023   ALKPHOS 90 09/13/2023   AST 24 09/13/2023   ALT 23 09/13/2023   ANIONGAP 9 09/15/2023    CBG (last 3)  No results for input(s): GLUCAP in the last 72 hours.    Coagulation Profile: Recent Labs  Lab 09/13/23 1614  INR 1.3*     Radiology Studies: I have personally reviewed the imaging studies  CT HEAD WO CONTRAST ( ) Result Date: 09/13/2023 CLINICAL DATA:  Brought in by EMS due to failure to thrive, poor intake, incontinence, nonproductive cough. Baseline dementia. Mental status change. EXAM: CT HEAD WITHOUT CONTRAST TECHNIQUE: Contiguous axial images were obtained from the base of the skull through the vertex without intravenous contrast. RADIATION DOSE REDUCTION: This exam was performed according to the departmental dose-optimization program which includes automated exposure control, adjustment of the mA and/or kV according to patient size and/or use of iterative reconstruction technique. COMPARISON:  CT head 06/23/2022 FINDINGS: Brain: No intracranial hemorrhage, mass effect, or evidence of acute infarct. No hydrocephalus. No extra-axial fluid collection. Age-commensurate cerebral atrophy and chronic small vessel ischemic disease. Vascular: No hyperdense vessel. Intracranial arterial calcification. Skull: No fracture or focal lesion. Sinuses/Orbits: No acute finding. Other: None. IMPRESSION: No acute intracranial abnormality. Electronically  Signed   By: Norman Gatlin M.D.   On: 09/13/2023 19:08   DG Chest 2 View Result Date: 09/13/2023 CLINICAL DATA:  Sepsis EXAM: CHEST - 2 VIEW COMPARISON:  X-ray 08/06/2022.  Older exams as well. FINDINGS: Underinflation. No consolidation, pneumothorax or effusion. No edema. Normal cardiopericardial silhouette. Calcified tortuous aorta. Overlapping cardiac leads. Degenerative changes along the spine. Fixation hardware along the lower cervical spine. Degenerative changes of the shoulders. IMPRESSION: Underinflation.  No acute cardiopulmonary disease. Electronically Signed   By: Ranell Bring M.D.   On: 09/13/2023 18:22   DG Wrist Complete Right Result Date: 09/13/2023 CLINICAL DATA:  Right wrist pain, redness, and swelling. EXAM: RIGHT WRIST - COMPLETE 3+ VIEW COMPARISON:  None Available. FINDINGS: Mild thumb carpometacarpal joint space narrowing and peripheral osteophytosis. Mild calcification within the triangular fibrocartilage complex. No acute fracture or dislocation. Mild atherosclerotic calcifications. IMPRESSION: Mild thumb carpometacarpal osteoarthritis. Electronically Signed   By: Tanda Lyons M.D.   On: 09/13/2023 18:04       Erik Jackson M.D. [facility] 09/15/2023, 12:53 PM  Available via Epic secure chat 7am-7pm After 7 pm, please refer to night coverage provider listed on amion.  Oh this is Dr. Davia

## 2023-09-15 NOTE — Plan of Care (Signed)

## 2023-09-16 ENCOUNTER — Telehealth: Payer: Self-pay | Admitting: Internal Medicine

## 2023-09-16 DIAGNOSIS — L0390 Cellulitis, unspecified: Secondary | ICD-10-CM | POA: Diagnosis not present

## 2023-09-16 DIAGNOSIS — G9341 Metabolic encephalopathy: Secondary | ICD-10-CM | POA: Diagnosis not present

## 2023-09-16 DIAGNOSIS — I10 Essential (primary) hypertension: Secondary | ICD-10-CM | POA: Diagnosis not present

## 2023-09-16 DIAGNOSIS — R404 Transient alteration of awareness: Secondary | ICD-10-CM | POA: Diagnosis not present

## 2023-09-16 LAB — BASIC METABOLIC PANEL
Anion gap: 10 (ref 5–15)
BUN: 18 mg/dL (ref 8–23)
CO2: 22 mmol/L (ref 22–32)
Calcium: 8.1 mg/dL — ABNORMAL LOW (ref 8.9–10.3)
Chloride: 103 mmol/L (ref 98–111)
Creatinine, Ser: 0.73 mg/dL (ref 0.61–1.24)
GFR, Estimated: >60 mL/min (ref >60)
Glucose, Bld: 78 mg/dL (ref 70–99)
Potassium: 3.6 mmol/L (ref 3.5–5.1)
Sodium: 135 mmol/L (ref 135–145)

## 2023-09-16 LAB — CBC
HCT: 33.2 % — ABNORMAL LOW (ref 39.0–52.0)
Hemoglobin: 10.3 g/dL — ABNORMAL LOW (ref 13.0–17.0)
MCH: 30.7 pg (ref 26.0–34.0)
MCHC: 31 g/dL (ref 30.0–36.0)
MCV: 98.8 fL (ref 80.0–100.0)
Platelets: 317 10*3/uL (ref 150–400)
RBC: 3.36 MIL/uL — ABNORMAL LOW (ref 4.22–5.81)
RDW: 14.8 % (ref 11.5–15.5)
WBC: 7.7 10*3/uL (ref 4.0–10.5)
nRBC: 0 % (ref 0.0–0.2)

## 2023-09-16 NOTE — Progress Notes (Signed)
 Physical Therapy Treatment Patient Details Name: Erik Jackson MRN: [id number] DOB: September 11, 1930 Today's Date: 09/16/2023   History of Present Illness Erik Jackson is a 89 y.o. male with medical history significant of dementia, hypertension, hyperlipidemia, hypothyroidism, asthma, allergic rhinitis, anxiety, B12 deficiency, chronic dermatitis, diverticulosis, history of TURP presented to ED via EMS from home for evaluation of generalized weakness, increased somnolence, cough, and not eating/drinking x 3 days.  Patient also noted to have redness and swelling of his right wrist and family reported to ED physician that patient fell asleep with his wrist hyperextended a few days ago and was found with his teeth digging into the skin of this wrist.    PT Comments  Pt seen for PT tx with pt agreeable with encouragement. Pt reports he ambulates without AD prior to admission. Pt with impaired cognition but follows simple commands fairly well during session; also limited by [facility]. Pt is able to complete bed mobility with significant use of abdominal muscles & momentum. Pt ambulates without AD with CGA with crouched posture, no overt LOB.  Pt requesting to rest in recliner at end of session. Will continue to follow pt acutely to address balance, endurance, gait.    If plan is discharge home, recommend the following: A little help with walking and/or transfers;A little help with bathing/dressing/bathroom;Assistance with cooking/housework;Direct supervision/assist for financial management;Direct supervision/assist for medications management;Supervision due to cognitive status   Can travel by private vehicle        Equipment Recommendations  None recommended by PT    Recommendations for Other Services       Precautions / Restrictions Precautions Precautions: Fall Precaution Comments: R wrist tenderness Restrictions Weight Bearing Restrictions Per Provider Order: No     Mobility  Bed Mobility Overal  bed mobility: Needs Assistance Bed Mobility: Supine to Sit     Supine to sit: Supervision, Used rails, HOB elevated          Transfers Overall transfer level: Needs assistance Equipment used: None Transfers: Sit to/from Stand Sit to Stand: Contact guard assist                Ambulation/Gait Ambulation/Gait assistance: Contact guard assist Gait Distance (Feet):  (>200 ft) Assistive device: None Gait Pattern/deviations: Decreased step length - left, Decreased step length - right, Decreased stride length, Decreased dorsiflexion - left, Decreased dorsiflexion - right Gait velocity: decreased     General Gait Details: decreased LUE reciprocal arm swing   Stairs             Wheelchair Mobility     Tilt Bed    Modified Rankin (Stroke Patients Only)       Balance Overall balance assessment: Needs assistance Sitting-balance support: No upper extremity supported Sitting balance-Leahy Scale: Good     Standing balance support: No upper extremity supported, During functional activity Standing balance-Leahy Scale: Fair                              Cognition Arousal: Alert Behavior During Therapy: WFL for tasks assessed/performed Overall Cognitive Status: History of cognitive impairments - at baseline                                 General Comments: difficult to assess 2/2 HOH, follows commands fairly well during session but states the walker in his room that used to belong to my  mother        Exercises      General Comments General comments (skin integrity, edema, etc.): R wrist in black brace upon PT arrival      Pertinent Vitals/Pain Pain Assessment Pain Assessment: Faces Faces Pain Scale: Hurts a little bit Pain Location: HA Pain Descriptors / Indicators: Headache Pain Intervention(s):  (notified nurse)    Home Living                          Prior Function            PT Goals (current goals can  now be found in the care plan section) Acute Rehab PT Goals Patient Stated Goal: Agreeable to walk PT Goal Formulation: Patient unable to participate in goal setting Time For Goal Achievement: 09/28/23 Potential to Achieve Goals: Good Progress towards PT goals: Progressing toward goals    Frequency    Min 1X/week      PT Plan      Co-evaluation              AM-PAC PT 6 Clicks Mobility   Outcome Measure  Help needed turning from your back to your side while in a flat bed without using bedrails?: None Help needed moving from lying on your back to sitting on the side of a flat bed without using bedrails?: A Little Help needed moving to and from a bed to a chair (including a wheelchair)?: A Little Help needed standing up from a chair using your arms (e.g., wheelchair or bedside chair)?: A Little Help needed to walk in hospital room?: A Little Help needed climbing 3-5 steps with a railing? : A Little 6 Click Score: 19    End of Session   Activity Tolerance: Patient tolerated treatment well Patient left: in chair;with chair alarm set;with call bell/phone within reach Nurse Communication: Mobility status (c/o HA) PT Visit Diagnosis: Unsteadiness on feet (R26.81);Muscle weakness (generalized) (M62.81);Difficulty in walking, not elsewhere classified (R26.2)     Time: 8576-8563 PT Time Calculation (min) (ACUTE ONLY): 13 min  Charges:    $Therapeutic Activity: 8-22 mins PT General Charges $$ ACUTE PT VISIT: 1 Visit                     Richerd Pinal, PT, DPT 09/16/23, 2:46 PM   Richerd CHRISTELLA Pinal 09/16/2023, 2:45 PM

## 2023-09-16 NOTE — NC FL2 (Signed)
 Erik Jackson  MEDICAID FL2 LEVEL OF CARE FORM     IDENTIFICATION  Patient Name: Erik Jackson Birthdate: [birth year] Sex: male Admission Date (Current Location): 09/13/2023  [facility] and [place] Number:  Producer, Television/film/video and Address:  [facility],  [address] [place], [place] [address]      Provider Number: [id number]  Attending Physician Name and Address:  Erik Nydia POUR, MD  Relative Name and Phone Number:  Erik, Jackson)  [phone] [facility])    Current Level of Care: Hospital Recommended Level of Care: Skilled Nursing Facility Prior Approval Number:    Date Approved/Denied:   PASRR Number: pending  Discharge Plan: SNF    Current Diagnoses: Patient Active Problem List   Diagnosis Date Noted   Cellulitis 09/13/2023   Actinic keratosis 03/06/2023   Pure hypercholesterolemia 03/06/2023   Sepsis due to cellulitis (HCC) 08/07/2022   Liver mass 07/02/2022   Abdominal aortic aneurysm (AAA) without rupture (HCC) 07/02/2022   Pedal edema 07/02/2022   Left leg cellulitis 07/02/2022   Traumatic rhabdomyolysis (HCC) 06/23/2022   AKI (acute kidney injury) (HCC) 06/23/2022   Liver lesion 06/23/2022   Acute metabolic encephalopathy 06/23/2022   Pressure sore 02/04/2022   Bradycardia 01/31/2022   Hyperglycemia 01/30/2021   Dementia (HCC) 05/13/2019   Cough 09/11/2018   B12 deficiency 02/14/2018   Unsteady gait 09/16/2017   Thrush 04/30/2017   Allergic rhinitis 07/22/2016   Primary osteoarthritis of left hip 02/03/2016   Vertigo 09/16/2015   Rib pain on left side 09/16/2015   Fall at home 09/16/2015   Enlarged prostate with urinary obstruction 05/19/2015   Nocturia 12/17/2014   Rash, skin 12/17/2014   Gout 12/01/2013   Encounter for well adult exam with abnormal findings 03/22/2011   Hyperlipidemia    Hypertension    Hypothyroidism     Orientation RESPIRATION BLADDER Height & Weight     Self  Normal Continent Weight: 72.1 kg Height:  5' 9 (175.3  cm)  BEHAVIORAL SYMPTOMS/MOOD NEUROLOGICAL BOWEL NUTRITION STATUS      Continent Diet (dysphagia 3)  AMBULATORY STATUS COMMUNICATION OF NEEDS Skin   Limited Assist Verbally (verbalizes a little) Normal                       Personal Care Assistance Level of Assistance  Bathing, Feeding, Dressing Bathing Assistance: Limited assistance Feeding assistance: Limited assistance Dressing Assistance: Limited assistance     Functional Limitations Info  Sight, Hearing, Speech Sight Info: Impaired Hearing Info: Impaired Speech Info: Impaired    SPECIAL CARE FACTORS FREQUENCY  PT (By licensed PT), OT (By licensed OT)     PT Frequency: 5x/wk OT Frequency: 5x/wk            Contractures Contractures Info: Not present    Additional Factors Info  Code Status, Allergies, Psychotropic Code Status Info: Full Code Allergies Info: Morphine , Amlodipine, Hydrochlorothiazide , Tape, Tessalon  (Benzonatate ) Psychotropic Info: N/A         Current Medications (09/16/2023):  This is the current hospital active medication list Current Facility-Administered Medications  Medication Dose Route Frequency Provider Last Rate Last Admin   acetaminophen  (TYLENOL ) tablet 650 mg  650 mg Oral Q6H PRN Rathore, Vasundhra, MD   650 mg at 09/14/23 2049   Or   acetaminophen  (TYLENOL ) suppository 650 mg  650 mg Rectal Q6H PRN Alfornia Madison, MD       Ampicillin -Sulbactam (UNASYN ) 3 g in sodium chloride  0.9 % 100 mL IVPB  3 g  Intravenous Q6H Gadhia, Jigna M, RPH 200 mL/hr at 09/16/23 1612 3 g at 09/16/23 1612   donepezil  (ARICEPT ) tablet 5 mg  5 mg Oral QHS Rai, Ripudeep K, MD   5 mg at 09/15/23 2052   guaiFENesin  (MUCINEX ) 12 hr tablet 600 mg  600 mg Oral BID Rai, Ripudeep K, MD   600 mg at 09/16/23 9146   ipratropium-albuterol  (DUONEB) 0.5-2.5 (3) MG/3ML nebulizer solution 3 mL  3 mL Nebulization Q6H PRN Rai, Ripudeep K, MD       levothyroxine  (SYNTHROID ) tablet 75 mcg  75 mcg Oral QAC breakfast Rai,  Ripudeep K, MD   75 mcg at 09/16/23 0519   rosuvastatin  (CRESTOR ) tablet 20 mg  20 mg Oral Daily Rai, Ripudeep K, MD   20 mg at 09/16/23 0853   vancomycin  (VANCOREADY) IVPB 1250 mg/250 mL  1,250 mg Intravenous Q24H Gadhia, Jigna M, RPH 166.7 mL/hr at 09/15/23 2056 1,250 mg at 09/15/23 2056     Discharge Medications: Please see discharge summary for a list of discharge medications.  Relevant Imaging Results:  Relevant Lab Results:   Additional Information SSN: [id number]  Erik JONELLE Rex, RN

## 2023-09-16 NOTE — Telephone Encounter (Signed)
 Copied from CRM [id number]. Topic: Referral - Question >> Sep 16, 2023  8:12 AM Pinkey ORN wrote: Reason for CRM: Referral Request / Conversation With Power Of Attorney  I received this request for a call from son  It appears to me that Pt is currently hospitalized; I respectfully request son make requests as Power of Attorney to be directed to the current Attending Physician and the Social Services currently working with him in hospital

## 2023-09-16 NOTE — TOC PASRR Note (Signed)
 Transition of Care [facility]) - Inpatient Brief Assessment   Patient Details  Name: Erik Jackson MRN: [id number] Date of Birth: Jan 23, 1931  Transition of Care [facility]) CM/SW Contact:    [name], RN Phone Number: 09/16/2023, 4:15 PM   Clinical Narrative:    Transition of Care Asessment:

## 2023-09-16 NOTE — Plan of Care (Signed)
   Problem: Education: Goal: Knowledge of General Education information will improve Description: Including pain rating scale, medication(s)/side effects and non-pharmacologic comfort measures Outcome: Progressing   Problem: Elimination: Goal: Will not experience complications related to bowel motility Outcome: Progressing

## 2023-09-16 NOTE — Progress Notes (Signed)
 Pharmacy Antibiotic Note  Erik Jackson is a 89 y.o. male admitted on 09/13/2023 with cellulitis. Pharmacy has been consulted for Unasyn  and Vancomycin  dosing.  Plan: Continue Unasyn  3g IV q6h Continue 1250mg  IV q24h Vancomycin  levels at steady state, as inidicated Monitor renal function, cultures, clinical course, duration of therapy  Height: 5' 9 (175.3 cm) Weight: 72.1 kg (158 lb 15.2 oz) IBW/kg (Calculated) : 70.7  Temp (24hrs), Avg:98.5 F (36.9 C), Min:98.3 F (36.8 C), Max:98.8 F (37.1 C)  Recent Labs  Lab 09/13/23 1614 09/13/23 1628 09/14/23 0543 09/15/23 0533 09/16/23 0440  WBC 9.9  --  8.3 8.1 7.7  CREATININE 0.99  --  0.85 0.86 0.73  LATICACIDVEN  --  1.2  --   --   --     Estimated Creatinine Clearance: 61.4 mL/min (by C-G formula based on SCr of 0.73 mg/dL).    Allergies  Allergen Reactions   Morphine  Other (See Comments)    Patient requests to not be given this for pain- religious reasons   Amlodipine Other (See Comments)    Edema   Hydrochlorothiazide  Other (See Comments)    Dizziness   Tape Other (See Comments)    Removing Band-Aids highly irritate the skin   Tessalon  [Benzonatate ] Other (See Comments)    Unknown reaction    Antimicrobials this admission: 1/3 Ceftriaxone  x 1 1/3 Vancomycin  >> 1/4 Unasyn  >>  Dose adjustments this admission: --  Microbiology results: 1/3 BCx: ngtd 1/3 Respiratory panel: negative   Thank you for allowing pharmacy to be a part of this patient's care.   Emyah Roznowski, PharmD, BCPS Clinical Pharmacist 09/16/2023 12:36 PM

## 2023-09-16 NOTE — TOC Initial Note (Addendum)
 Transition of Care [facility]) - Initial/Assessment Note    Patient Details  Name: Erik Jackson MRN: [id number] Date of Birth: 1931/07/08  Transition of Care [facility]) CM/SW Contact:    [name], RN Phone Number: 09/16/2023, 3:04 PM  Clinical Narrative:  NCM call to pt's son, [name]) to introduce role of TOC/NCM and review for dc planning, PT recommendation for short term rehab  vs HH PT. Tim agreeable to short term rehab,, prefers [facility], [facility], reports pt needs LTC placement as family is no longer able to care for him at home, reports pt does not have [employer] and has not applied for LTC Medicaid. NCM will fax out for short term  bed offers with notification will need transition to LTC.      -4:47pm Updated FL2 and 30 day PASRR note uploaded to NCMust, Level 2 PASRR pending.             Expected Discharge Plan: Skilled Nursing Facility Barriers to Discharge: Continued Medical Work up   Patient Goals and CMS Choice Patient states their goals for this hospitalization and ongoing recovery are:: return home          Expected Discharge Plan and Services       Living arrangements for the past 2 months: Single Family Home                                      Prior Living Arrangements/Services Living arrangements for the past 2 months: Single Family Home Lives with:: Adult Children   Do you feel safe going back to the place where you live?: No   Per pt's son, [name], pt will need LTC placement, no longer able to care for him at home  Need for Family Participation in Patient Care: Yes (Comment) Care giver support system in place?: Yes (comment)   Criminal Activity/Legal Involvement Pertinent to Current Situation/Hospitalization: No - Comment as needed  Activities of Daily Living   ADL Screening (condition at time of admission) Independently performs ADLs?: No Does the patient have a NEW difficulty with bathing/dressing/toileting/self-feeding that is expected  to last >3 days?: No Does the patient have a NEW difficulty with getting in/out of bed, walking, or climbing stairs that is expected to last >3 days?: No Does the patient have a NEW difficulty with communication that is expected to last >3 days?: No Is the patient deaf or have difficulty hearing?: Yes Does the patient have difficulty seeing, even when wearing glasses/contacts?: Yes Does the patient have difficulty concentrating, remembering, or making decisions?: Yes  Permission Sought/Granted                  Emotional Assessment Appearance:: Appears stated age       Alcohol / Substance Use: Not Applicable Psych Involvement: No (comment)  Admission diagnosis:  Cellulitis [L03.90] Patient Active Problem List   Diagnosis Date Noted   Cellulitis 09/13/2023   Actinic keratosis 03/06/2023   Pure hypercholesterolemia 03/06/2023   Sepsis due to cellulitis (HCC) 08/07/2022   Liver mass 07/02/2022   Abdominal aortic aneurysm (AAA) without rupture (HCC) 07/02/2022   Pedal edema 07/02/2022   Left leg cellulitis 07/02/2022   Traumatic rhabdomyolysis (HCC) 06/23/2022   AKI (acute kidney injury) (HCC) 06/23/2022   Liver lesion 06/23/2022   Acute metabolic encephalopathy 06/23/2022   Pressure sore 02/04/2022   Bradycardia 01/31/2022   Hyperglycemia 01/30/2021  Dementia (HCC) 05/13/2019   Cough 09/11/2018   B12 deficiency 02/14/2018   Unsteady gait 09/16/2017   Thrush 04/30/2017   Allergic rhinitis 07/22/2016   Primary osteoarthritis of left hip 02/03/2016   Vertigo 09/16/2015   Rib pain on left side 09/16/2015   Fall at home 09/16/2015   Enlarged prostate with urinary obstruction 05/19/2015   Nocturia 12/17/2014   Rash, skin 12/17/2014   Gout 12/01/2013   Encounter for well adult exam with abnormal findings 03/22/2011   Hyperlipidemia    Hypertension    Hypothyroidism    PCP:  [name], MD Pharmacy:   [facility] [address], [place] - [address] [place] [address] Phone: [phone] Fax: [phone]     Social Drivers of Health (SDOH) Social History: SDOH Screenings   Food Insecurity: No Food Insecurity (09/14/2023)  Housing: Patient Declined (09/16/2023)  Transportation Needs: No Transportation Needs (09/14/2023)  Utilities: Not At Risk (09/14/2023)  Alcohol Screen: Low Risk  (03/06/2023)  Depression (PHQ2-9): High Risk (04/30/2023)  Financial Resource Strain: Low Risk  (03/06/2023)  Physical Activity: Sufficiently Active (03/06/2023)  Social Connections: Patient Unable To Answer (09/14/2023)  Stress: No Stress Concern Present (03/06/2023)  Tobacco Use: Medium Risk (09/13/2023)   SDOH Interventions: Social Connections Interventions: Patient Unable to Answer   Readmission Risk Interventions    09/16/2023    3:01 PM 08/09/2022   10:52 AM  Readmission Risk Prevention Plan  Post Dischage Appt Complete   Medication Screening Complete   Transportation Screening Complete Complete  PCP or Specialist Appt within 5-7 Days  Complete  Home Care Screening  Complete  Medication Review (RN CM)  Complete

## 2023-09-16 NOTE — Progress Notes (Signed)
 [facility]                                                                              Erik Jackson, is a 89 y.o. male, DOB - March 09, 1931, FMW:[id number] Admit date - 09/13/2023    Outpatient Primary MD for the patient is Erik Lynwood ORN, MD  LOS - 2  days  Chief Complaint  Patient presents with   Cough   Failure To Thrive       Brief summary   Patient is a 89 year old male with dementia, HTN, hypothyroidism, hyperlipidemia, asthma anxiety, TURP presented to ED for generalized weakness, increased somnolence, cough and not eating or drinking for the last 3 days.  Patient was noted to have redness and swelling of his right wrist.  Family reported to ED physician that patient had had fallen asleep with his wrist hyperextended a few days ago and was found with his teeth digging into the skin of his wrist. In ED, temp 101.3 F, HR 93, RR 18, BP 140/75, O2 sats 94% on room air. UA negative for UTI, lactic acid normal, COVID negative. Chest x-ray showed no acute cardiopulmonary disease. X-ray of the right wrist showed mild thumb carpometacarpal osteoarthritis and no acute fracture or dislocation. CT head negative for acute intracranial abnormality. Patient was admitted for further workup.  Assessment & Plan    Principal Problem: Right wrist cellulitis -Presented with fevers, tachycardia, altered mental status.  Right wrist with erythema extending to the dorsum of the hand, swelling, tender to touch, ROM decreased due to pain -CRP 19.7, uric acid 5.4, ESR 97 -Evaluated by hand surgery, Dr. Ahmad, does not appear to be septic arthritis and does not I&D -Cellulitis improving, will continue antibiotics, fore arm brace placed   Active Problems:   Acute metabolic encephalopathy in the setting of dementia -Family reported increased somnolence over the past few days -CT head showed no acute intracranial abnormality -Hold home Seroquel , UA negative for UTI -TSH 0.58, B12  278 -Respiratory virus panel negative, COVID-19 negative, UA negative for UTI, CXR negative for infiltrates -Continue Aricept  -Much more alert and conversant today, appears close to his baseline  Cough with possible aspiration on 1/5 -Chest x-ray showed no active disease -SLP eval ration showed mild aspiration risk, placed on dysphagia 3 mechanical soft diet with thin liquids -Currently on IV antibiotics   FTT, generalized weakness, poor oral intake -Encourage p.o. diet, PT evaluation recommended home health however per patient's son, they are unable to provide care or supervision, and he is not safe being alone -Family requesting SNF, TOC consult placed    Hypertension -BP soft, hold lisinopril      Hyperlipidemia -Continue Crestor     Hypothyroidism -Continue levothyroxine  75 mcg daily  Estimated body mass index is 23.47 kg/m as calculated from the following:   Height as of this encounter: 5' 9 (1.753 m).   Weight as of this encounter: 72.1 kg.  Code Status: Full CODE STATUS DVT Prophylaxis:  SCDs Start: 09/13/23 2054   Level of Care: Level of care: Med-Surg Family Communication: Updated patient's son, [name] on the phone today. Disposition Plan:  Remains inpatient appropriate:   Family requesting placement.   Procedures:    Consultants:   Hand surgery  Antimicrobials:   Anti-infectives (From admission, onward)    Start     Dose/Rate Route Frequency Ordered Stop   09/14/23 2200  vancomycin  (VANCOREADY) IVPB 1250 mg/250 mL        1,250 mg 166.7 mL/hr over 90 Minutes Intravenous Every 24 hours 09/13/23 2106     09/14/23 0000  Ampicillin -Sulbactam (UNASYN ) 3 g in sodium chloride  0.9 % 100 mL IVPB        3 g 200 mL/hr over 30 Minutes Intravenous Every 6 hours 09/13/23 2101     09/13/23 2115  Vancomycin  (VANCOCIN ) 1,500 mg in sodium chloride  0.9 % 500 mL IVPB        1,500 mg 250 mL/hr over 120 Minutes Intravenous  Once 09/13/23 2106 09/14/23 0047    09/13/23 2000  cefTRIAXone  (ROCEPHIN ) 1 g in sodium chloride  0.9 % 100 mL IVPB        1 g 200 mL/hr over 30 Minutes Intravenous  Once 09/13/23 1949 09/13/23 2035          Medications  donepezil   5 mg Oral QHS   guaiFENesin   600 mg Oral BID   levothyroxine   75 mcg Oral QAC breakfast   rosuvastatin   20 mg Oral Daily      Subjective:   Erik Jackson was seen and examined today.  Much more alert and conversant today, watching TV.  Requesting for cereal, milk and banana.  Forearm wrist brace placed. No fevers.  No acute chest pain, shortness of breath, nausea or vomiting.  Objective:   Vitals:   09/15/23 0547 09/15/23 1410 09/15/23 2108 09/16/23 0558  BP: (!) 133/57 (!) 144/83 (!) 113/104 (!) 152/70  Pulse: (!) 55 66 67 64  Resp: 16 20 18 18   Temp: 97.7 F (36.5 C) 98.5 F (36.9 C) 98.8 F (37.1 C) 98.3 F (36.8 C)  TempSrc: Oral Oral Oral Oral  SpO2: 99% 97% 97% 97%  Weight:      Height:        Intake/Output Summary (Last 24 hours) at 09/16/2023 1157 Last data filed at 09/16/2023 0939 Gross per 24 hour  Intake 860.09 ml  Output 550 ml  Net 310.09 ml     Wt Readings from Last 3 Encounters:  09/13/23 72.1 kg  04/30/23 72.1 kg  03/06/23 72.1 kg    Physical Exam General: Alert and oriented x self, NAD, pleasant Cardiovascular: S1 S2 clear, RRR.  Respiratory: CTAB Gastrointestinal: Soft, nontender, nondistended, NBS Ext: no pedal edema bilaterally Neuro: moving all 4 extremities spontaneously Skin: forearm wrist brace placed Psych: Normal affect, has dementia    On 09/14/2023   Data Reviewed:  I have personally reviewed following labs    CBC Lab Results  Component Value Date   WBC 7.7 09/16/2023   RBC 3.36 (L) 09/16/2023   HGB 10.3 (L) 09/16/2023   HCT 33.2 (L) 09/16/2023   MCV 98.8 09/16/2023   MCH 30.7 09/16/2023   PLT 317 09/16/2023   MCHC 31.0 09/16/2023   RDW 14.8 09/16/2023   LYMPHSABS 1.1 09/13/2023   MONOABS 0.8 09/13/2023   EOSABS 0.0  09/13/2023   BASOSABS 0.0 09/13/2023     Last metabolic panel Lab Results  Component Value Date   NA 135 09/16/2023   K 3.6 09/16/2023   CL 103 09/16/2023   CO2 22 09/16/2023   BUN 18 09/16/2023   CREATININE 0.73 09/16/2023  GLUCOSE 78 09/16/2023   GFRNONAA >60 09/16/2023   GFRAA >60 02/04/2016   CALCIUM  8.1 (L) 09/16/2023   PHOS 4.0 06/24/2022   PROT 7.8 09/13/2023   ALBUMIN 3.2 (L) 09/13/2023   BILITOT 0.6 09/13/2023   ALKPHOS 90 09/13/2023   AST 24 09/13/2023   ALT 23 09/13/2023   ANIONGAP 10 09/16/2023    CBG (last 3)  No results for input(s): GLUCAP in the last 72 hours.    Coagulation Profile: Recent Labs  Lab 09/13/23 1614  INR 1.3*     Radiology Studies: I have personally reviewed the imaging studies  DG CHEST PORT 1 VIEW Result Date: 09/15/2023 CLINICAL DATA:  Dyspnea EXAM: PORTABLE CHEST 1 VIEW COMPARISON:  Chest radiograph dated 09/13/2023. FINDINGS: The heart size and mediastinal contours are within normal limits. Both lungs are clear. Degenerative changes are seen in the spine. IMPRESSION: No active disease. Electronically Signed   By: Norman Hopper M.D.   On: 09/15/2023 15:08       Leona Alen M.D. [facility] 09/16/2023, 11:57 AM  Available via Epic secure chat 7am-7pm After 7 pm, please refer to night coverage provider listed on amion.  Oh this is Dr. Davia

## 2023-09-17 ENCOUNTER — Ambulatory Visit: Payer: Medicare Other | Admitting: Internal Medicine

## 2023-09-17 DIAGNOSIS — R404 Transient alteration of awareness: Secondary | ICD-10-CM | POA: Diagnosis not present

## 2023-09-17 DIAGNOSIS — G9341 Metabolic encephalopathy: Secondary | ICD-10-CM | POA: Diagnosis not present

## 2023-09-17 DIAGNOSIS — L03113 Cellulitis of right upper limb: Secondary | ICD-10-CM | POA: Diagnosis not present

## 2023-09-17 LAB — CREATININE, SERUM
Creatinine, Ser: 0.77 mg/dL (ref 0.61–1.24)
GFR, Estimated: >60 mL/min (ref >60)

## 2023-09-17 MED ORDER — ENSURE ENLIVE PO LIQD
237.0000 mL | Freq: Two times a day (BID) | ORAL | Status: DC
  Administered 2023-09-18 – 2023-09-20 (×5): 237 mL via ORAL

## 2023-09-17 NOTE — TOC PASRR Note (Signed)
 30 Day PASRR Note   Patient Details  Name: Erik Jackson Date of Birth: 1930/11/30   Transition of Care [facility]) CM/SW Contact:    [name], RN Phone Number: 09/17/2023, 11:17 AM  To Whom It May Concern:  Please be advised that this patient will require a short-term nursing home stay - anticipated 30 days or less for rehabilitation and strengthening.   The plan is for return home.

## 2023-09-17 NOTE — Plan of Care (Signed)
   Problem: Activity: Goal: Risk for activity intolerance will decrease Outcome: Progressing   Problem: Nutrition: Goal: Adequate nutrition will be maintained Outcome: Progressing

## 2023-09-17 NOTE — Progress Notes (Signed)
 [facility]                                                                              Erik Jackson, is a 89 y.o. male, DOB - 1931-06-02, FMW:[id number] Admit date - 09/13/2023    Outpatient Primary MD for the patient is Erik Lynwood ORN, MD  LOS - 3  days  Chief Complaint  Patient presents with   Cough   Failure To Thrive       Brief summary   Patient is a 89 year old male with dementia, HTN, hypothyroidism, hyperlipidemia, asthma anxiety, TURP presented to ED for generalized weakness, increased somnolence, cough and not eating or drinking for the last 3 days.  Patient was noted to have redness and swelling of his right wrist.  Family reported to ED physician that patient had had fallen asleep with his wrist hyperextended a few days ago and was found with his teeth digging into the skin of his wrist. In ED, temp 101.3 F, HR 93, RR 18, BP 140/75, O2 sats 94% on room air. UA negative for UTI, lactic acid normal, COVID negative. Chest x-ray showed no acute cardiopulmonary disease. X-ray of the right wrist showed mild thumb carpometacarpal osteoarthritis and no acute fracture or dislocation. CT head negative for acute intracranial abnormality. Patient was admitted for further workup.  Assessment & Plan    Principal Problem: Right wrist cellulitis, nonpurulent -Presented with fevers, tachycardia, altered mental status.  Right wrist with erythema extending to the dorsum of the hand, swelling, tender to touch, ROM decreased due to pain -CRP 19.7, uric acid 5.4, ESR 97 -Evaluated by hand surgery, Dr. Ahmad, does not appear to be septic arthritis and does not I&D -Continue forearm brace.  DC IV vancomycin   Active Problems:   Acute metabolic encephalopathy in the setting of dementia -Family reported increased somnolence over the past few days -CT head showed no acute intracranial abnormality -Hold home Seroquel , UA negative for UTI -TSH 0.58, B12 278 -Respiratory  virus panel negative, COVID-19 negative, UA negative for UTI, CXR negative for infiltrates -Continue Aricept    Cough with possible aspiration on 1/5 -Chest x-ray showed no active disease -SLP eval ration showed mild aspiration risk, placed on dysphagia 3 mechanical soft diet with thin liquids -Continue IV Unasyn    FTT, generalized weakness, poor oral intake -Encourage p.o. diet, PT evaluation recommended home health however per patient's son, they are unable to provide care or supervision, and he is not safe being alone -Family requesting SNF, TOC consult placed    Hypertension -BP soft, hold lisinopril      Hyperlipidemia -Continue Crestor     Hypothyroidism -Continue levothyroxine  75 mcg daily  Estimated body mass index is 23.47 kg/m as calculated from the following:   Height as of this encounter: 5' 9 (1.753 m).   Weight as of this encounter: 72.1 kg.  Code Status: Full CODE STATUS DVT Prophylaxis:  SCDs Start: 09/13/23 2054   Level of Care: Level of care: Med-Surg Family Communication: Updated patient's son, Granger Chui on the phone on 1/6 Disposition Plan:      Remains inpatient appropriate:   Family requesting placement.  Procedures:    Consultants:   Hand surgery  Antimicrobials:   Anti-infectives (From admission, onward)    Start     Dose/Rate Route Frequency Ordered Stop   09/14/23 2200  vancomycin  (VANCOREADY) IVPB 1250 mg/250 mL  Status:  Discontinued        1,250 mg 166.7 mL/hr over 90 Minutes Intravenous Every 24 hours 09/13/23 2106 09/17/23 1327   09/14/23 0000  Ampicillin -Sulbactam (UNASYN ) 3 g in sodium chloride  0.9 % 100 mL IVPB        3 g 200 mL/hr over 30 Minutes Intravenous Every 6 hours 09/13/23 2101     09/13/23 2115  Vancomycin  (VANCOCIN ) 1,500 mg in sodium chloride  0.9 % 500 mL IVPB        1,500 mg 250 mL/hr over 120 Minutes Intravenous  Once 09/13/23 2106 09/14/23 0047   09/13/23 2000  cefTRIAXone  (ROCEPHIN ) 1 g in sodium chloride  0.9 %  100 mL IVPB        1 g 200 mL/hr over 30 Minutes Intravenous  Once 09/13/23 1949 09/13/23 2035          Medications  donepezil   5 mg Oral QHS   feeding supplement  237 mL Oral BID BM   guaiFENesin   600 mg Oral BID   levothyroxine   75 mcg Oral QAC breakfast   rosuvastatin   20 mg Oral Daily      Subjective:   Erik Jackson was seen and examined today.  Today somewhat sleepy, not very interactive.  No acute issues overnight.  No fevers.  No acute chest pain, shortness of breath, nausea or vomiting.  Objective:   Vitals:   09/16/23 0558 09/16/23 1327 09/16/23 2109 09/17/23 0555  BP: (!) 152/70 137/83 (!) 145/71 (!) 136/58  Pulse: 64 75 76 83  Resp: 18 18 18 19   Temp: 98.3 F (36.8 C) 98.4 F (36.9 C) 99.7 F (37.6 C) 98.1 F (36.7 C)  TempSrc: Oral Oral Oral Oral  SpO2: 97% 95% 96% 96%  Weight:      Height:        Intake/Output Summary (Last 24 hours) at 09/17/2023 1344 Last data filed at 09/17/2023 0555 Gross per 24 hour  Intake 1070.22 ml  Output 820 ml  Net 250.22 ml     Wt Readings from Last 3 Encounters:  09/13/23 72.1 kg  04/30/23 72.1 kg  03/06/23 72.1 kg   Physical Exam General: Awake but not very interactive today, NAD Cardiovascular: S1 S2 clear, RRR.  Respiratory: CTAB Gastrointestinal: Soft, nontender, nondistended, NBS Ext: no pedal edema bilaterally Neuro:  Psych: dementia, not interactive today    On 09/14/2023   Data Reviewed:  I have personally reviewed following labs    CBC Lab Results  Component Value Date   WBC 7.7 09/16/2023   RBC 3.36 (L) 09/16/2023   HGB 10.3 (L) 09/16/2023   HCT 33.2 (L) 09/16/2023   MCV 98.8 09/16/2023   MCH 30.7 09/16/2023   PLT 317 09/16/2023   MCHC 31.0 09/16/2023   RDW 14.8 09/16/2023   LYMPHSABS 1.1 09/13/2023   MONOABS 0.8 09/13/2023   EOSABS 0.0 09/13/2023   BASOSABS 0.0 09/13/2023     Last metabolic panel Lab Results  Component Value Date   NA 135 09/16/2023   K 3.6 09/16/2023   CL  103 09/16/2023   CO2 22 09/16/2023   BUN 18 09/16/2023   CREATININE 0.77 09/17/2023   GLUCOSE 78 09/16/2023   GFRNONAA >60 09/17/2023   GFRAA >60 02/04/2016  CALCIUM  8.1 (L) 09/16/2023   PHOS 4.0 06/24/2022   PROT 7.8 09/13/2023   ALBUMIN 3.2 (L) 09/13/2023   BILITOT 0.6 09/13/2023   ALKPHOS 90 09/13/2023   AST 24 09/13/2023   ALT 23 09/13/2023   ANIONGAP 10 09/16/2023    CBG (last 3)  No results for input(s): GLUCAP in the last 72 hours.    Coagulation Profile: Recent Labs  Lab 09/13/23 1614  INR 1.3*     Radiology Studies: I have personally reviewed the imaging studies  No results found.      Nydia Distance M.D. [facility] 09/17/2023, 1:44 PM  Available via Epic secure chat 7am-7pm After 7 pm, please refer to night coverage provider listed on amion.  Oh this is Dr. Distance

## 2023-09-18 DIAGNOSIS — G9341 Metabolic encephalopathy: Secondary | ICD-10-CM | POA: Diagnosis not present

## 2023-09-18 DIAGNOSIS — I10 Essential (primary) hypertension: Secondary | ICD-10-CM | POA: Diagnosis not present

## 2023-09-18 DIAGNOSIS — R404 Transient alteration of awareness: Secondary | ICD-10-CM | POA: Diagnosis not present

## 2023-09-18 DIAGNOSIS — F0150 Vascular dementia without behavioral disturbance: Secondary | ICD-10-CM | POA: Diagnosis not present

## 2023-09-18 LAB — CULTURE, BLOOD (ROUTINE X 2)
Culture: NO GROWTH
Culture: NO GROWTH
Special Requests: ADEQUATE
Special Requests: ADEQUATE

## 2023-09-18 NOTE — Progress Notes (Signed)
 Speech Language Pathology Treatment: Dysphagia  Patient Details Name: Erik Jackson MRN: [id number] DOB: 02/01/1931 Today's Date: 09/18/2023 Time: 8274-8261 SLP Time Calculation (min) (ACUTE ONLY): 13 min  Assessment / Plan / Recommendation Clinical Impression  Pt seen for follow up regarding dysphagia - following BSE completed on 09/15/2023.  Pt's hearing aids are not charged and thus SLP used hospital amplifier.  Pt articulated I have problems swallowing but does not expand on information even with direct question cue.  He was willing to consume water via straw after which he said That's not going to do it.  No family present to establish baseline function -   Pt wiling to consume intake including graham cracker, Italian Ice, applesauce and water.   Congested cough noted at baseline that was not exacerbated with water and Italian ice.  Overt cough with expectoration to viscous white secretions noted immediately after solid intake.  Pt then declined to consume more po - without articulating why. Advise he drink plenty of liquids (prefer water) to help decrease secretion viscocity.   He does have remote h/o pharyngeal abscess in 2005 and prior cervical spine surgery.  Pt denies dysphagia causing him to stop eating - admits to poor appetite.    Recommend to continue po diet with general precautions - assuring pt self feeding, encouraging liquid and solid intake and strength of cough as much as pt will allow.   SLP will sign off at this time as mitigation strategies are in place and swallow precaution sign updated.    HPI HPI: Patient is a 89 year old male with dementia, HTN, hypothyroidism, hyperlipidemia, asthma anxiety, TURP presented to ED for generalized weakness, increased somnolence, cough and not eating or drinking for the last 3 days.  Patient was noted to have redness and swelling of his right wrist.  Family reported to ED physician that patient had had fallen asleep with his wrist  hyperextended a few days ago and was found with his teeth digging into the skin of his wrist. Patient is a 89 year old male with dementia, HTN, hypothyroidism, hyperlipidemia, asthma anxiety, TURP presented to ED for generalized weakness, increased somnolence, cough and not eating or drinking for the last 3 days.  Patient was noted to have redness and swelling of his right wrist.  Family reported to ED physician that patient had had fallen asleep with his wrist hyperextended a few days ago and was found with his teeth digging into the skin of his wrist.      SLP Plan  Continue with current plan of care      Recommendations for follow up therapy are one component of a multi-disciplinary discharge planning process, led by the attending physician.  Recommendations may be updated based on patient status, additional functional criteria and insurance authorization.    Recommendations  Diet recommendations: Dysphagia 3 (mechanical soft);Thin liquid Liquids provided via: Cup;Straw Medication Administration: Whole meds with liquid Supervision:  (set up assist) Compensations: Minimize environmental distractions;Slow rate;Small sips/bites (start intake with liquids) Postural Changes and/or Swallow Maneuvers: Seated upright 90 degrees;Upright 30-60 min after meal                  Oral care BID     Dysphagia, oropharyngeal phase (R13.12)     Continue with current plan of care    Madelin POUR, MS [facility] SLP Acute Rehab Services Office [phone]  Erik Jackson  09/18/2023, 6:08 PM

## 2023-09-18 NOTE — Progress Notes (Signed)
 Physical Therapy Treatment Patient Details Name: Erik Jackson MRN: [id number] DOB: 1931-02-16 Today's Date: 09/18/2023   History of Present Illness Erik Jackson is a 89 y.o. male with medical history significant of dementia, hypertension, hyperlipidemia, hypothyroidism, asthma, allergic rhinitis, anxiety, B12 deficiency, chronic dermatitis, diverticulosis, history of TURP presented to ED via EMS from home for evaluation of generalized weakness, increased somnolence, cough, and not eating/drinking x 3 days.  Patient also noted to have redness and swelling of his right wrist and family reported to ED physician that patient fell asleep with his wrist hyperextended a few days ago and was found with his teeth digging into the skin of this wrist.    PT Comments  General Comments: AxO x 2 following all commands and able to express needs.  VERY HOH.  Low vision.  Pleasant. Pt in bed sleeping but arousal.  Untouched breakfast tray.  Assisted OOB to amb to bathroom then to recliner was difficult.  General bed mobility comments: posterior lean initially requiring min A to correct and use bed pad to complete scooting.  General transfer comment: required increased assist this session esp off lower toilet level.  Unsteady with turns and back steps as well.  Poor self correction to midline and delayed reaction responce.  HIGH FALL RISK. General Gait Details: required increased assist this session due to gait instability requiring assist with walker direction (unable to grasp using R hand due to cast splint).  Too unsteady to amb just HHA.  After 5 feet removed walker as pt was unable to safely navigate to having pt hold IV pole L hand.  Limited amb distance due to weakness and MAX c/o fatigue/tired.  Unsteady esp with turns and back steps to recliner.  Poor self correction to midline and delayed reaction responce.  HIGH FALL RISK. Positioned in recliner. Assisted with breakfast tray set up for pt to self feed.   Prior  to admit, pt was amb without any AD or assist.  Pt will need ST Rehab at SNF to address mobility and functional decline prior to safely returning home.    If plan is discharge home, recommend the following: A little help with walking and/or transfers;A little help with bathing/dressing/bathroom;Assistance with cooking/housework;Direct supervision/assist for financial management;Direct supervision/assist for medications management;Supervision due to cognitive status   Can travel by private vehicle        Equipment Recommendations  None recommended by PT    Recommendations for Other Services       Precautions / Restrictions Precautions Precautions: Fall Precaution Comments: R wrist tenderness Required Braces or Orthoses: Splint/Cast Splint/Cast: soft wrist brace Restrictions Weight Bearing Restrictions Per Provider Order: No Other Position/Activity Restrictions: WBAT     Mobility  Bed Mobility Overal bed mobility: Needs Assistance Bed Mobility: Supine to Sit     Supine to sit: Min assist     General bed mobility comments: posterior lean initially requiring min A to correct and use bed pad to complete scooting.    Transfers Overall transfer level: Needs assistance Equipment used: None   Sit to Stand: Min assist, Mod assist           General transfer comment: required increased assist this session esp off lower toilet level.  Unsteady with turns and back steps as well.  Poor self correction to midline and delayed reaction responce.  HIGH FALL RISK.    Ambulation/Gait Ambulation/Gait assistance: Min assist, Mod assist Gait Distance (Feet): 22 Feet Assistive device: Rolling walker (2 wheels), IV  Pole   Gait velocity: decreased     General Gait Details: required increased assist this session due to gait instability requiring assist with walker direction (unable to grasp using R hand due to cast splint).  Too unsteady to amb just HHA.  After 5 feet removed walker as  pt was unable to safely navigate to having pt hold IV pole L hand.  Limited amb distance due to weakness and MAX c/o fatigue/tired.  Unsteady esp with turns and back steps to recliner.  Poor self correction to midline and delayed reaction responce.  HIGH FALL RISK.   Stairs             Wheelchair Mobility     Tilt Bed    Modified Rankin (Stroke Patients Only)       Balance                                            Cognition Arousal: Alert Behavior During Therapy: WFL for tasks assessed/performed Overall Cognitive Status: History of cognitive impairments - at baseline                                 General Comments: AxO x 2 following all commands and able to express needs.  VERY HOH.  Low vision.  Pleasant.        Exercises      General Comments        Pertinent Vitals/Pain Pain Assessment Pain Assessment: No/denies pain    Home Living                          Prior Function            PT Goals (current goals can now be found in the care plan section) Progress towards PT goals: Progressing toward goals    Frequency    Min 1X/week      PT Plan      Co-evaluation              AM-PAC PT 6 Clicks Mobility   Outcome Measure  Help needed turning from your back to your side while in a flat bed without using bedrails?: A Little Help needed moving from lying on your back to sitting on the side of a flat bed without using bedrails?: A Little Help needed moving to and from a bed to a chair (including a wheelchair)?: A Lot Help needed standing up from a chair using your arms (e.g., wheelchair or bedside chair)?: A Lot Help needed to walk in hospital room?: A Lot Help needed climbing 3-5 steps with a railing? : A Lot 6 Click Score: 14    End of Session Equipment Utilized During Treatment: Gait belt Activity Tolerance: Patient tolerated treatment well Patient left: in chair;with chair alarm set;with  call bell/phone within reach Nurse Communication: Mobility status PT Visit Diagnosis: Unsteadiness on feet (R26.81);Muscle weakness (generalized) (M62.81);Difficulty in walking, not elsewhere classified (R26.2)     Time: 8948-8881 PT Time Calculation (min) (ACUTE ONLY): 27 min  Charges:    $Gait Training: 8-22 mins $Therapeutic Activity: 8-22 mins PT General Charges $$ ACUTE PT VISIT: 1 Visit                     Emmry Hinsch  PTA Acute  Rehabilitation Services Office M-F          [phone]

## 2023-09-18 NOTE — TOC Progression Note (Signed)
 Transition of Care [facility]) - Progression Note    Patient Details  Name: Erik Jackson MRN: [id number] Date of Birth: 02/21/1931  [facility]) CM/SW Contact  [name], RN Phone Number: 09/18/2023, 3:38 PM  Clinical Narrative: Call to patient's son, [name], to review short term rehab/SNF bed offer [facility], [employer], [address], [facility]), [name] states he will research facilities, request bed offers bed extended to [place] area. NCM faxed out bed offers for [place] and surrounding area, await bed offers. TOC will continue to follow.     Expected Discharge Plan: Skilled Nursing Facility Barriers to Discharge: Continued Medical Work up  Expected Discharge Plan and Services       Living arrangements for the past 2 months: Single Family Home                                       Social Determinants of Health (SDOH) Interventions SDOH Screenings   Food Insecurity: No Food Insecurity (09/14/2023)  Housing: Patient Declined (09/16/2023)  Transportation Needs: No Transportation Needs (09/14/2023)  Utilities: Not At Risk (09/14/2023)  Alcohol Screen: Low Risk  (03/06/2023)  Depression (PHQ2-9): High Risk (04/30/2023)  Financial Resource Strain: Low Risk  (03/06/2023)  Physical Activity: Sufficiently Active (03/06/2023)  Social Connections: Patient Unable To Answer (09/14/2023)  Stress: No Stress Concern Present (03/06/2023)  Tobacco Use: Medium Risk (09/13/2023)    Readmission Risk Interventions    09/16/2023    3:01 PM 08/09/2022   10:52 AM  Readmission Risk Prevention Plan  Post Dischage Appt Complete   Medication Screening Complete   Transportation Screening Complete Complete  PCP or Specialist Appt within 5-7 Days  Complete  Home Care Screening  Complete  Medication Review (RN CM)  Complete

## 2023-09-18 NOTE — Progress Notes (Signed)
 [facility]                                                                              Erik Jackson, is a 89 y.o. male, DOB - [birth year], FMW:[id number] Admit date - 09/13/2023    Outpatient Primary MD for the patient is Erik Lynwood ORN, MD  LOS - 4  days  Chief Complaint  Patient presents with   Cough   Failure To Thrive       Brief summary   Patient is a 89 year old male with dementia, HTN, hypothyroidism, hyperlipidemia, asthma anxiety, TURP presented to ED for generalized weakness, increased somnolence, cough and not eating or drinking for the last 3 days.  Patient was noted to have redness and swelling of his right wrist.  Family reported to ED physician that patient had had fallen asleep with his wrist hyperextended a few days ago and was found with his teeth digging into the skin of his wrist. In ED, temp 101.3 F, HR 93, RR 18, BP 140/75, O2 sats 94% on room air. UA negative for UTI, lactic acid normal, COVID negative. Chest x-ray showed no acute cardiopulmonary disease. X-ray of the right wrist showed mild thumb carpometacarpal osteoarthritis and no acute fracture or dislocation. CT head negative for acute intracranial abnormality. Patient was admitted for further workup.  Assessment & Plan    Principal Problem: Right wrist cellulitis, nonpurulent -Presented with fevers, tachycardia, altered mental status.  Right wrist with erythema extending to the dorsum of the hand, swelling, tender to touch, ROM decreased due to pain -CRP 19.7, uric acid 5.4, ESR 97 -Evaluated by hand surgery, Dr. Ahmad, does not appear to be septic arthritis and does not I&D -Forearm brace removed and examined wrist, improved from admission.  Will transition to oral antibiotics upon discharge.  Active Problems:   Acute metabolic encephalopathy in the setting of dementia -Family reported increased somnolence over the past few days -CT head showed no acute intracranial  abnormality -Hold home Seroquel , UA negative for UTI -TSH 0.58, B12 278 -Respiratory virus panel negative, COVID-19 negative, UA negative for UTI, CXR negative for infiltrates -Continue Aricept , appears close to his baseline   Cough with possible aspiration on 1/5 -Chest x-ray showed no active disease -SLP eval ration showed mild aspiration risk, placed on dysphagia 3 mechanical soft diet with thin liquids -Continue IV Unasyn    FTT, generalized weakness, poor oral intake -Encourage p.o. diet, PT evaluation recommended home health however per patient's son, they are unable to provide care or supervision, and he is not safe being alone -Family requesting SNF, TOC consult placed    Hypertension -BP stable     Hyperlipidemia -Continue Crestor     Hypothyroidism -Continue levothyroxine  75 mcg daily  Estimated body mass index is 23.47 kg/m as calculated from the following:   Height as of this encounter: 5' 9 (1.753 m).   Weight as of this encounter: 72.1 kg.  Code Status: Full CODE STATUS DVT Prophylaxis:  SCDs Start: 09/13/23 2054   Level of Care: Level of care: Med-Surg Family Communication: Updated patient's son, [name] on the phone on 1/6 Disposition Plan:  Remains inpatient appropriate:   Family requesting placement.  TOC assisting.   Procedures:    Consultants:   Hand surgery  Antimicrobials:   Anti-infectives (From admission, onward)    Start     Dose/Rate Route Frequency Ordered Stop   09/14/23 2200  vancomycin  (VANCOREADY) IVPB 1250 mg/250 mL  Status:  Discontinued        1,250 mg 166.7 mL/hr over 90 Minutes Intravenous Every 24 hours 09/13/23 2106 09/17/23 1327   09/14/23 0000  Ampicillin -Sulbactam (UNASYN ) 3 g in sodium chloride  0.9 % 100 mL IVPB        3 g 200 mL/hr over 30 Minutes Intravenous Every 6 hours 09/13/23 2101     09/13/23 2115  Vancomycin  (VANCOCIN ) 1,500 mg in sodium chloride  0.9 % 500 mL IVPB        1,500 mg 250 mL/hr over 120  Minutes Intravenous  Once 09/13/23 2106 09/14/23 0047   09/13/23 2000  cefTRIAXone  (ROCEPHIN ) 1 g in sodium chloride  0.9 % 100 mL IVPB        1 g 200 mL/hr over 30 Minutes Intravenous  Once 09/13/23 1949 09/13/23 2035          Medications  donepezil   5 mg Oral QHS   feeding supplement  237 mL Oral BID BM   guaiFENesin   600 mg Oral BID   levothyroxine   75 mcg Oral QAC breakfast   rosuvastatin   20 mg Oral Daily      Subjective:   Erik Jackson was seen and examined today.  Right hand/wrist examined, improvement in the swelling and redness.  No significant tenderness.  No fevers.  No acute chest pain, shortness of breath, nausea or vomiting.  Objective:   Vitals:   09/17/23 0555 09/17/23 1358 09/17/23 2241 09/18/23 0646  BP: (!) 136/58 (!) 146/123 (!) 136/90 (!) 155/91  Pulse: 83 78 81 76  Resp: 19 16 15 14   Temp: 98.1 F (36.7 C) 100 F (37.8 C) 98.8 F (37.1 C) 99 F (37.2 C)  TempSrc: Oral Oral Oral Oral  SpO2: 96% 97% 97% 97%  Weight:      Height:        Intake/Output Summary (Last 24 hours) at 09/18/2023 1117 Last data filed at 09/18/2023 0859 Gross per 24 hour  Intake 600.23 ml  Output 1000 ml  Net -399.77 ml     Wt Readings from Last 3 Encounters:  09/13/23 72.1 kg  04/30/23 72.1 kg  03/06/23 72.1 kg    Physical Exam General: Alert and awake, NAD, appears comfortable Cardiovascular: S1 S2 clear, RRR.  Respiratory: CTAB, no wheezing, rales or rhonchi Gastrointestinal: Soft, nontender, nondistended, NBS Ext: no pedal edema bilaterally Neuro: no new deficits Skin: Still has some redness on the right wrist but swelling improved, no significant tenderness. Psych: dementia   09/18/2023    On 09/14/2023 on admission    Data Reviewed:  I have personally reviewed following labs    CBC Lab Results  Component Value Date   WBC 7.7 09/16/2023   RBC 3.36 (L) 09/16/2023   HGB 10.3 (L) 09/16/2023   HCT 33.2 (L) 09/16/2023   MCV 98.8 09/16/2023   MCH  30.7 09/16/2023   PLT 317 09/16/2023   MCHC 31.0 09/16/2023   RDW 14.8 09/16/2023   LYMPHSABS 1.1 09/13/2023   MONOABS 0.8 09/13/2023   EOSABS 0.0 09/13/2023   BASOSABS 0.0 09/13/2023     Last metabolic panel Lab Results  Component Value Date   NA 135 09/16/2023  K 3.6 09/16/2023   CL 103 09/16/2023   CO2 22 09/16/2023   BUN 18 09/16/2023   CREATININE 0.77 09/17/2023   GLUCOSE 78 09/16/2023   GFRNONAA >60 09/17/2023   GFRAA >60 02/04/2016   CALCIUM  8.1 (L) 09/16/2023   PHOS 4.0 06/24/2022   PROT 7.8 09/13/2023   ALBUMIN 3.2 (L) 09/13/2023   BILITOT 0.6 09/13/2023   ALKPHOS 90 09/13/2023   AST 24 09/13/2023   ALT 23 09/13/2023   ANIONGAP 10 09/16/2023    CBG (last 3)  No results for input(s): GLUCAP in the last 72 hours.    Coagulation Profile: Recent Labs  Lab 09/13/23 1614  INR 1.3*     Radiology Studies: I have personally reviewed the imaging studies  No results found.      Nydia Distance M.D. [facility] 09/18/2023, 11:17 AM  Available via Epic secure chat 7am-7pm After 7 pm, please refer to night coverage provider listed on amion.  Oh this is Dr. Distance

## 2023-09-18 NOTE — NC FL2 (Signed)
 [place]  MEDICAID FL2 LEVEL OF CARE FORM     IDENTIFICATION  Patient Name: Erik Jackson Birthdate: 1930-11-06 Sex: male Admission Date (Current Location): 09/13/2023  [facility] and [place] Number:  Producer, Television/film/video and Address:  [facility],  [address] [place], [place] [address]      Provider Number: [id number]  Attending Physician Name and Address:  Davia Nydia POUR, MD  Relative Name and Phone Number:  Brodey, Bonn (Son)  [phone] [facility])    Current Level of Care: Hospital Recommended Level of Care: Skilled Nursing Facility Prior Approval Number:    Date Approved/Denied:   PASRR Number: [id number]  Discharge Plan: SNF    Current Diagnoses: Patient Active Problem List   Diagnosis Date Noted   Cellulitis 09/13/2023   Actinic keratosis 03/06/2023   Pure hypercholesterolemia 03/06/2023   Sepsis due to cellulitis (HCC) 08/07/2022   Liver mass 07/02/2022   Abdominal aortic aneurysm (AAA) without rupture (HCC) 07/02/2022   Pedal edema 07/02/2022   Left leg cellulitis 07/02/2022   Traumatic rhabdomyolysis (HCC) 06/23/2022   AKI (acute kidney injury) (HCC) 06/23/2022   Liver lesion 06/23/2022   Acute metabolic encephalopathy 06/23/2022   Pressure sore 02/04/2022   Bradycardia 01/31/2022   Hyperglycemia 01/30/2021   Dementia (HCC) 05/13/2019   Cough 09/11/2018   B12 deficiency 02/14/2018   Unsteady gait 09/16/2017   Thrush 04/30/2017   Allergic rhinitis 07/22/2016   Primary osteoarthritis of left hip 02/03/2016   Vertigo 09/16/2015   Rib pain on left side 09/16/2015   Fall at home 09/16/2015   Enlarged prostate with urinary obstruction 05/19/2015   Nocturia 12/17/2014   Rash, skin 12/17/2014   Gout 12/01/2013   Encounter for well adult exam with abnormal findings 03/22/2011   Hyperlipidemia    Hypertension    Hypothyroidism     Orientation RESPIRATION BLADDER Height & Weight     Self  Normal Continent Weight: 72.1 kg Height:  5' 9  (175.3 cm)  BEHAVIORAL SYMPTOMS/MOOD NEUROLOGICAL BOWEL NUTRITION STATUS      Continent Diet (dysphagia 3)  AMBULATORY STATUS COMMUNICATION OF NEEDS Skin   Limited Assist Verbally (verbalizes a little) Normal                       Personal Care Assistance Level of Assistance  Bathing, Feeding, Dressing Bathing Assistance: Limited assistance Feeding assistance: Limited assistance Dressing Assistance: Limited assistance     Functional Limitations Info  Sight, Hearing, Speech Sight Info: Impaired Hearing Info: Impaired Speech Info: Impaired    SPECIAL CARE FACTORS FREQUENCY  PT (By licensed PT), OT (By licensed OT)     PT Frequency: 5x/wk OT Frequency: 5x/wk            Contractures Contractures Info: Not present    Additional Factors Info  Code Status, Allergies, Psychotropic Code Status Info: Full Code Allergies Info: Morphine , Amlodipine, Hydrochlorothiazide , Tape, Tessalon  (Benzonatate ) Psychotropic Info: N/A         Current Medications (09/18/2023):  This is the current hospital active medication list Current Facility-Administered Medications  Medication Dose Route Frequency Provider Last Rate Last Admin   acetaminophen  (TYLENOL ) tablet 650 mg  650 mg Oral Q6H PRN Rathore, Vasundhra, MD   650 mg at 09/14/23 2049   Or   acetaminophen  (TYLENOL ) suppository 650 mg  650 mg Rectal Q6H PRN Alfornia Madison, MD       Ampicillin -Sulbactam (UNASYN ) 3 g in sodium chloride  0.9 % 100 mL IVPB  3 g  Intravenous Q6H Gadhia, Jigna M, RPH 200 mL/hr at 09/18/23 1115 3 g at 09/18/23 1115   donepezil  (ARICEPT ) tablet 5 mg  5 mg Oral QHS Rai, Ripudeep K, MD   5 mg at 09/17/23 2032   feeding supplement (ENSURE ENLIVE / ENSURE PLUS) liquid 237 mL  237 mL Oral BID BM Rai, Ripudeep K, MD   237 mL at 09/18/23 1000   guaiFENesin  (MUCINEX ) 12 hr tablet 600 mg  600 mg Oral BID Rai, Ripudeep K, MD   600 mg at 09/18/23 1008   ipratropium-albuterol  (DUONEB) 0.5-2.5 (3) MG/3ML nebulizer  solution 3 mL  3 mL Nebulization Q6H PRN Rai, Ripudeep K, MD       levothyroxine  (SYNTHROID ) tablet 75 mcg  75 mcg Oral QAC breakfast Rai, Ripudeep K, MD   75 mcg at 09/18/23 0532   rosuvastatin  (CRESTOR ) tablet 20 mg  20 mg Oral Daily Rai, Ripudeep K, MD   20 mg at 09/18/23 1008     Discharge Medications: Please see discharge summary for a list of discharge medications.  Relevant Imaging Results:  Relevant Lab Results:   Additional Information SSN: [id number]  [name], RN

## 2023-09-19 DIAGNOSIS — G9341 Metabolic encephalopathy: Secondary | ICD-10-CM | POA: Diagnosis not present

## 2023-09-19 DIAGNOSIS — I10 Essential (primary) hypertension: Secondary | ICD-10-CM | POA: Diagnosis not present

## 2023-09-19 DIAGNOSIS — R404 Transient alteration of awareness: Secondary | ICD-10-CM | POA: Diagnosis not present

## 2023-09-19 DIAGNOSIS — F0150 Vascular dementia without behavioral disturbance: Secondary | ICD-10-CM | POA: Diagnosis not present

## 2023-09-19 LAB — BASIC METABOLIC PANEL
Anion gap: 11 (ref 5–15)
BUN: 19 mg/dL (ref 8–23)
CO2: 24 mmol/L (ref 22–32)
Calcium: 8.1 mg/dL — ABNORMAL LOW (ref 8.9–10.3)
Chloride: 98 mmol/L (ref 98–111)
Creatinine, Ser: 0.66 mg/dL (ref 0.61–1.24)
GFR, Estimated: >60 mL/min (ref >60)
Glucose, Bld: 89 mg/dL (ref 70–99)
Potassium: 3.5 mmol/L (ref 3.5–5.1)
Sodium: 133 mmol/L — ABNORMAL LOW (ref 135–145)

## 2023-09-19 LAB — CBC
HCT: 33.6 % — ABNORMAL LOW (ref 39.0–52.0)
Hemoglobin: 11.5 g/dL — ABNORMAL LOW (ref 13.0–17.0)
MCH: 31.3 pg (ref 26.0–34.0)
MCHC: 34.2 g/dL (ref 30.0–36.0)
MCV: 91.3 fL (ref 80.0–100.0)
Platelets: 414 10*3/uL — ABNORMAL HIGH (ref 150–400)
RBC: 3.68 MIL/uL — ABNORMAL LOW (ref 4.22–5.81)
RDW: 14.6 % (ref 11.5–15.5)
WBC: 6.9 10*3/uL (ref 4.0–10.5)
nRBC: 0 % (ref 0.0–0.2)

## 2023-09-19 NOTE — TOC Progression Note (Addendum)
 Transition of Care [facility]) - Progression Note    Patient Details  Name: Erik Jackson MRN: [id number] Date of Birth: September 29, 1930  Transition of Care [facility]) CM/SW Contact  [name], RN Phone Number: 09/19/2023, 10:23 AM  Clinical Narrative:   Call to pt's son Albina) to review additional bed offers for short term rehab/SNFETTER [facility] [place], [facility] and [place]), states he will outreach to [place] to speak with admissions coordinator and possible schedule a tour. TOC will continue to follow.   -2:41pm Call received from [name] (son), reports completed tour at [facility], accepted bed offer. Insurance [place] initiated via [place] [id number], shara pending.     Expected Discharge Plan: Skilled Nursing Facility Barriers to Discharge: Continued Medical Work up  Expected Discharge Plan and Services       Living arrangements for the past 2 months: Single Family Home                                       Social Determinants of Health (SDOH) Interventions SDOH Screenings   Food Insecurity: No Food Insecurity (09/14/2023)  Housing: Patient Declined (09/16/2023)  Transportation Needs: No Transportation Needs (09/14/2023)  Utilities: Not At Risk (09/14/2023)  Alcohol Screen: Low Risk  (03/06/2023)  Depression (PHQ2-9): High Risk (04/30/2023)  Financial Resource Strain: Low Risk  (03/06/2023)  Physical Activity: Sufficiently Active (03/06/2023)  Social Connections: Patient Unable To Answer (09/14/2023)  Stress: No Stress Concern Present (03/06/2023)  Tobacco Use: Medium Risk (09/13/2023)    Readmission Risk Interventions    09/16/2023    3:01 PM 08/09/2022   10:52 AM  Readmission Risk Prevention Plan  Post Dischage Appt Complete   Medication Screening Complete   Transportation Screening Complete Complete  PCP or Specialist Appt within 5-7 Days  Complete  Home Care Screening  Complete  Medication Review (RN CM)  Complete

## 2023-09-19 NOTE — Progress Notes (Signed)
 Pharmacy Antibiotic Note  Erik Jackson is a 89 y.o. male admitted on 09/13/2023 with cellulitis. Also cough with possible aspiration on 1/5.  Pharmacy remains consulted for Unasyn  dosing.  Plan: Continue Unasyn  3g IV q6h Monitor clinical progress, renal function    Height: 5' 9 (175.3 cm) Weight: 72.1 kg (158 lb 15.2 oz) IBW/kg (Calculated) : 70.7  Temp (24hrs), Avg:98.4 F (36.9 C), Min:98.4 F (36.9 C), Max:98.4 F (36.9 C)  Recent Labs  Lab 09/13/23 1614 09/13/23 1628 09/14/23 0543 09/15/23 0533 09/16/23 0440 09/17/23 0445 09/19/23 0649  WBC 9.9  --  8.3 8.1 7.7  --  6.9  CREATININE 0.99  --  0.85 0.86 0.73 0.77 0.66  LATICACIDVEN  --  1.2  --   --   --   --   --     Estimated Creatinine Clearance: 61.4 mL/min (by C-G formula based on SCr of 0.66 mg/dL).    Allergies  Allergen Reactions   Morphine  Other (See Comments)    Patient requests to not be given this for pain- religious reasons   Amlodipine Other (See Comments)    Edema   Hydrochlorothiazide  Other (See Comments)    Dizziness   Tape Other (See Comments)    Removing Band-Aids highly irritate the skin   Tessalon  [Benzonatate ] Other (See Comments)    Unknown reaction    Antimicrobials this admission: 1/3 Ceftriaxone  x 1 1/3 Vancomycin  >> 1/6 1/4 Unasyn  >>  Microbiology results: 1/3 BCx: ngtd 1/3 Respiratory panel: negative   Thank you for allowing pharmacy to be a part of this patient's care.  Eleanor EMERSON Agent, PharmD, BCPS Clinical Pharmacist [place] 09/19/2023 12:35 PM

## 2023-09-19 NOTE — Progress Notes (Signed)
 [facility]                                                                              Erik Jackson, is a 89 y.o. male, DOB - 08-12-1931, FMW:[id number] Admit date - 09/13/2023    Outpatient Primary MD for the patient is [name], MD  LOS - 5  days  Chief Complaint  Patient presents with   Cough   Failure To Thrive       Brief summary   Patient is a 89 year old male with dementia, HTN, hypothyroidism, hyperlipidemia, asthma anxiety, TURP presented to ED for generalized weakness, increased somnolence, cough and not eating or drinking for the last 3 days.  Patient was noted to have redness and swelling of his right wrist.  Family reported to ED physician that patient had had fallen asleep with his wrist hyperextended a few days ago and was found with his teeth digging into the skin of his wrist. In ED, temp 101.3 F, HR 93, RR 18, BP 140/75, O2 sats 94% on room air. UA negative for UTI, lactic acid normal, COVID negative. Chest x-ray showed no acute cardiopulmonary disease. X-ray of the right wrist showed mild thumb carpometacarpal osteoarthritis and no acute fracture or dislocation. CT head negative for acute intracranial abnormality. Patient was admitted for further workup.  09/19/2023 cellulitis improved, awaiting placement  Assessment & Plan    Principal Problem: Right wrist cellulitis, nonpurulent -Presented with fevers, tachycardia, altered mental status.  Right wrist with erythema extending to the dorsum of the hand, swelling, tender to touch, ROM decreased due to pain -CRP 19.7, uric acid 5.4, ESR 97 -Evaluated by hand surgery, Dr. Ahmad, does not appear to be septic arthritis and does not I&D -Improved from admission, completed antibiotics for 7 days.   Active Problems:   Acute metabolic encephalopathy in the setting of dementia -Family reported increased somnolence over the past few days prior to admission -CT head showed no acute intracranial  abnormality -Hold home Seroquel , UA negative for UTI -TSH 0.58, B12 278 -Respiratory virus panel negative, COVID-19 negative, UA negative for UTI, CXR negative for infiltrates -Continue Aricept , appears close to his baseline   Cough with possible aspiration on 1/5 -Chest x-ray showed no active disease -SLP eval ration showed mild aspiration risk, placed on dysphagia 3 mechanical soft diet with thin liquids -Continue IV Unasyn , complete for 7 days   FTT, generalized weakness, poor oral intake -Encourage p.o. diet, PT evaluation recommended home health however per patient's son, they are unable to provide care or supervision, and he is not safe being alone -Family requesting SNF, TOC consult placed    Hypertension -BP stable     Hyperlipidemia -Continue Crestor     Hypothyroidism -Continue levothyroxine  75 mcg daily  Estimated body mass index is 23.47 kg/m as calculated from the following:   Height as of this encounter: 5' 9 (1.753 m).   Weight as of this encounter: 72.1 kg.  Code Status: Full CODE STATUS DVT Prophylaxis:  SCDs Start: 09/13/23 2054   Level of Care: Level of care: Med-Surg Family Communication: Updated patient's son, Erik Jackson on the phone on  1/6 Disposition Plan:      Remains inpatient appropriate: Medically stable, awaiting placement.   Procedures:    Consultants:   Hand surgery  Antimicrobials:   Anti-infectives (From admission, onward)    Start     Dose/Rate Route Frequency Ordered Stop   09/14/23 2200  vancomycin  (VANCOREADY) IVPB 1250 mg/250 mL  Status:  Discontinued        1,250 mg 166.7 mL/hr over 90 Minutes Intravenous Every 24 hours 09/13/23 2106 09/17/23 1327   09/14/23 0000  Ampicillin -Sulbactam (UNASYN ) 3 g in sodium chloride  0.9 % 100 mL IVPB        3 g 200 mL/hr over 30 Minutes Intravenous Every 6 hours 09/13/23 2101 09/20/23 2359   09/13/23 2115  Vancomycin  (VANCOCIN ) 1,500 mg in sodium chloride  0.9 % 500 mL IVPB        1,500  mg 250 mL/hr over 120 Minutes Intravenous  Once 09/13/23 2106 09/14/23 0047   09/13/23 2000  cefTRIAXone  (ROCEPHIN ) 1 g in sodium chloride  0.9 % 100 mL IVPB        1 g 200 mL/hr over 30 Minutes Intravenous  Once 09/13/23 1949 09/13/23 2035          Medications  donepezil   5 mg Oral QHS   feeding supplement  237 mL Oral BID BM   guaiFENesin   600 mg Oral BID   levothyroxine   75 mcg Oral QAC breakfast   rosuvastatin   20 mg Oral Daily      Subjective:   Erik Jackson was seen and examined today.  No acute complaints, right hand/wrist in brace.  No fevers or chills.  No chest pain.  Eating breakfast without difficulty.  Mental status appears close to his baseline.   Objective:   Vitals:   09/18/23 0646 09/18/23 1228 09/18/23 2217 09/19/23 1343  BP: (!) 155/91 (!) 120/52 (!) 115/54 (!) 145/80  Pulse: 76 79 75 70  Resp: 14 16 20 16   Temp: 99 F (37.2 C) 98.4 F (36.9 C) 98.4 F (36.9 C) (!) 97.5 F (36.4 C)  TempSrc: Oral Oral Oral Axillary  SpO2: 97% 96% 95% 100%  Weight:      Height:        Intake/Output Summary (Last 24 hours) at 09/19/2023 1409 Last data filed at 09/19/2023 1343 Gross per 24 hour  Intake 580 ml  Output 1325 ml  Net -745 ml     Wt Readings from Last 3 Encounters:  09/13/23 72.1 kg  04/30/23 72.1 kg  03/06/23 72.1 kg   Physical Exam General: Alert and oriented to self, hearing deficit, NAD Cardiovascular: S1 S2 clear, RRR.  Respiratory: CTAB, no wheezing Gastrointestinal: Soft, nontender, nondistended, NBS Ext: no pedal edema bilaterally Neuro: no new deficits Skin: Right forearm hand/wrist in brace  Psych: dementia with hearing deficit    09/18/2023    On 09/14/2023 on admission    Data Reviewed:  I have personally reviewed following labs    CBC Lab Results  Component Value Date   WBC 6.9 09/19/2023   RBC 3.68 (L) 09/19/2023   HGB 11.5 (L) 09/19/2023   HCT 33.6 (L) 09/19/2023   MCV 91.3 09/19/2023   MCH 31.3 09/19/2023   PLT  414 (H) 09/19/2023   MCHC 34.2 09/19/2023   RDW 14.6 09/19/2023   LYMPHSABS 1.1 09/13/2023   MONOABS 0.8 09/13/2023   EOSABS 0.0 09/13/2023   BASOSABS 0.0 09/13/2023     Last metabolic panel Lab Results  Component Value Date  NA 133 (L) 09/19/2023   K 3.5 09/19/2023   CL 98 09/19/2023   CO2 24 09/19/2023   BUN 19 09/19/2023   CREATININE 0.66 09/19/2023   GLUCOSE 89 09/19/2023   GFRNONAA >60 09/19/2023   GFRAA >60 02/04/2016   CALCIUM  8.1 (L) 09/19/2023   PHOS 4.0 06/24/2022   PROT 7.8 09/13/2023   ALBUMIN 3.2 (L) 09/13/2023   BILITOT 0.6 09/13/2023   ALKPHOS 90 09/13/2023   AST 24 09/13/2023   ALT 23 09/13/2023   ANIONGAP 11 09/19/2023    CBG (last 3)  No results for input(s): GLUCAP in the last 72 hours.    Coagulation Profile: Recent Labs  Lab 09/13/23 1614  INR 1.3*     Radiology Studies: I have personally reviewed the imaging studies  No results found.      Nydia Distance M.D. [facility] 09/19/2023, 2:09 PM  Available via Epic secure chat 7am-7pm After 7 pm, please refer to night coverage provider listed on amion.  Oh this is Dr. Distance

## 2023-09-19 NOTE — Plan of Care (Signed)
 ?  Problem: Clinical Measurements: ?Goal: Will remain free from infection ?Outcome: Progressing ?  ?

## 2023-09-19 NOTE — Progress Notes (Signed)
 Occupational Therapy Treatment Patient Details Name: Erik Jackson MRN: [id number] DOB: 07-01-1931 Today's Date: 09/19/2023   History of present illness Erik Jackson is a 89 y.o. male with medical history significant of dementia, hypertension, hyperlipidemia, hypothyroidism, asthma, allergic rhinitis, anxiety, B12 deficiency, chronic dermatitis, diverticulosis, history of TURP presented to ED via EMS from home for evaluation of generalized weakness, increased somnolence, cough, and not eating/drinking x 3 days.  Patient also noted to have redness and swelling of his right wrist and family reported to ED physician that patient fell asleep with his wrist hyperextended a few days ago and was found with his teeth digging into the skin of this wrist.   OT comments  Pt readily willing to work with OT. Min assist for supine to sit with HOB up. Min assist, stabilizing with IV pole to stand from elevated bed and transfer to chair. Min assist to don front opening gown and for 2 grooming activities in sitting. Pt having difficulty using R hand effectively due to pain. Used L hand as a lead with oral care once cued. Patient will benefit from continued inpatient follow up therapy, <3 hours/day.      If plan is discharge home, recommend the following:  A little help with walking and/or transfers;A lot of help with bathing/dressing/bathroom;Assistance with cooking/housework;Assistance with feeding;Direct supervision/assist for medications management;Direct supervision/assist for financial management;Assist for transportation;Help with stairs or ramp for entrance   Equipment Recommendations  Other (comment) (defer)    Recommendations for Other Services      Precautions / Restrictions Precautions Precautions: Fall Precaution Comments: R wrist tenderness Required Braces or Orthoses: Splint/Cast Splint/Cast: soft wrist brace Restrictions Weight Bearing Restrictions Per Provider Order: No       Mobility  Bed Mobility Overal bed mobility: Needs Assistance Bed Mobility: Supine to Sit     Supine to sit: Min assist     General bed mobility comments: assist to raise trunk    Transfers Overall transfer level: Needs assistance Equipment used:  (IV pole) Transfers: Sit to/from Stand, Bed to chair/wheelchair/BSC Sit to Stand: Min assist     Step pivot transfers: Min assist     General transfer comment: elevated bed, assist to steady, pt stabilized on IV pole     Balance Overall balance assessment: Needs assistance   Sitting balance-Leahy Scale: Fair     Standing balance support: Single extremity supported Standing balance-Leahy Scale: Poor                             ADL either performed or assessed with clinical judgement   ADL Overall ADL's : Needs assistance/impaired     Grooming: Oral care;Minimal assistance;Sitting;Wash/dry hands;Wash/dry face Grooming Details (indicate cue type and reason): assist to clean upper denture plate, open toothpaste, cues to lead with L hand as R is painful         Upper Body Dressing : Minimal assistance;Sitting Upper Body Dressing Details (indicate cue type and reason): front opening gown                 Functional mobility during ADLs: Minimal assistance (IV pole)      Extremity/Trunk Assessment              Vision       Perception     Praxis      Cognition Arousal: Alert Behavior During Therapy: WFL for tasks assessed/performed Overall Cognitive Status: History of cognitive impairments -  at baseline                                 General Comments: difficult to assess due to impaired hearing, decreased problem solving        Exercises      Shoulder Instructions       General Comments      Pertinent Vitals/ Pain       Pain Assessment Pain Assessment: Faces Faces Pain Scale: Hurts little more Pain Location: R wrist Pain Descriptors / Indicators: Grimacing, Guarding,  Discomfort Pain Intervention(s): Monitored during session  Home Living                                          Prior Functioning/Environment              Frequency  Min 1X/week        Progress Toward Goals  OT Goals(current goals can now be found in the care plan section)  Progress towards OT goals: Progressing toward goals  Acute Rehab OT Goals OT Goal Formulation: With patient Time For Goal Achievement: 09/28/23 Potential to Achieve Goals: Good  Plan      Co-evaluation                 AM-PAC OT 6 Clicks Daily Activity     Outcome Measure   Help from another person eating meals?: A Little Help from another person taking care of personal grooming?: A Little Help from another person toileting, which includes using toliet, bedpan, or urinal?: A Lot Help from another person bathing (including washing, rinsing, drying)?: A Lot Help from another person to put on and taking off regular upper body clothing?: A Little Help from another person to put on and taking off regular lower body clothing?: A Lot 6 Click Score: 15    End of Session Equipment Utilized During Treatment: Gait belt  OT Visit Diagnosis: Unsteadiness on feet (R26.81);Muscle weakness (generalized) (M62.81);Other symptoms and signs involving cognitive function;Pain Pain - Right/Left: Right Pain - part of body: Arm   Activity Tolerance Patient tolerated treatment well   Patient Left in chair;with call bell/phone within reach;with chair alarm set   Nurse Communication          Time: 8669-8648 OT Time Calculation (min): 21 min  Charges: OT General Charges $OT Visit: 1 Visit OT Treatments $Self Care/Home Management : 8-22 mins  Mliss HERO, OTR/L Acute Rehabilitation Services Office: [phone]   Kennth Mliss Helling 09/19/2023, 2:16 PM

## 2023-09-19 NOTE — Plan of Care (Signed)

## 2023-09-20 DIAGNOSIS — R627 Adult failure to thrive: Secondary | ICD-10-CM | POA: Diagnosis not present

## 2023-09-20 DIAGNOSIS — S0990XA Unspecified injury of head, initial encounter: Secondary | ICD-10-CM | POA: Diagnosis not present

## 2023-09-20 DIAGNOSIS — W19XXXA Unspecified fall, initial encounter: Secondary | ICD-10-CM | POA: Diagnosis not present

## 2023-09-20 DIAGNOSIS — F03B0 Unspecified dementia, moderate, without behavioral disturbance, psychotic disturbance, mood disturbance, and anxiety: Secondary | ICD-10-CM

## 2023-09-20 DIAGNOSIS — R410 Disorientation, unspecified: Secondary | ICD-10-CM | POA: Diagnosis not present

## 2023-09-20 DIAGNOSIS — Z872 Personal history of diseases of the skin and subcutaneous tissue: Secondary | ICD-10-CM | POA: Diagnosis not present

## 2023-09-20 DIAGNOSIS — R531 Weakness: Secondary | ICD-10-CM | POA: Diagnosis not present

## 2023-09-20 DIAGNOSIS — M6281 Muscle weakness (generalized): Secondary | ICD-10-CM | POA: Diagnosis not present

## 2023-09-20 DIAGNOSIS — L0390 Cellulitis, unspecified: Secondary | ICD-10-CM | POA: Diagnosis not present

## 2023-09-20 DIAGNOSIS — J45909 Unspecified asthma, uncomplicated: Secondary | ICD-10-CM | POA: Diagnosis not present

## 2023-09-20 DIAGNOSIS — N390 Urinary tract infection, site not specified: Secondary | ICD-10-CM | POA: Diagnosis not present

## 2023-09-20 DIAGNOSIS — S0081XA Abrasion of other part of head, initial encounter: Secondary | ICD-10-CM | POA: Diagnosis not present

## 2023-09-20 DIAGNOSIS — Z043 Encounter for examination and observation following other accident: Secondary | ICD-10-CM | POA: Diagnosis not present

## 2023-09-20 DIAGNOSIS — E039 Hypothyroidism, unspecified: Secondary | ICD-10-CM | POA: Diagnosis not present

## 2023-09-20 DIAGNOSIS — R059 Cough, unspecified: Secondary | ICD-10-CM | POA: Diagnosis not present

## 2023-09-20 DIAGNOSIS — Z888 Allergy status to other drugs, medicaments and biological substances status: Secondary | ICD-10-CM | POA: Diagnosis not present

## 2023-09-20 DIAGNOSIS — S0181XA Laceration without foreign body of other part of head, initial encounter: Secondary | ICD-10-CM | POA: Diagnosis not present

## 2023-09-20 DIAGNOSIS — Z743 Need for continuous supervision: Secondary | ICD-10-CM | POA: Diagnosis not present

## 2023-09-20 DIAGNOSIS — S50812A Abrasion of left forearm, initial encounter: Secondary | ICD-10-CM | POA: Diagnosis not present

## 2023-09-20 DIAGNOSIS — E785 Hyperlipidemia, unspecified: Secondary | ICD-10-CM | POA: Diagnosis not present

## 2023-09-20 DIAGNOSIS — R9431 Abnormal electrocardiogram [ECG] [EKG]: Secondary | ICD-10-CM | POA: Diagnosis not present

## 2023-09-20 DIAGNOSIS — G9341 Metabolic encephalopathy: Secondary | ICD-10-CM | POA: Diagnosis not present

## 2023-09-20 DIAGNOSIS — I10 Essential (primary) hypertension: Secondary | ICD-10-CM | POA: Diagnosis not present

## 2023-09-20 DIAGNOSIS — Z79899 Other long term (current) drug therapy: Secondary | ICD-10-CM | POA: Diagnosis not present

## 2023-09-20 DIAGNOSIS — L03113 Cellulitis of right upper limb: Secondary | ICD-10-CM | POA: Diagnosis not present

## 2023-09-20 LAB — BASIC METABOLIC PANEL
Anion gap: 9 (ref 5–15)
BUN: 21 mg/dL (ref 8–23)
CO2: 26 mmol/L (ref 22–32)
Calcium: 7.9 mg/dL — ABNORMAL LOW (ref 8.9–10.3)
Chloride: 101 mmol/L (ref 98–111)
Creatinine, Ser: 0.68 mg/dL (ref 0.61–1.24)
GFR, Estimated: >60 mL/min (ref >60)
Glucose, Bld: 91 mg/dL (ref 70–99)
Potassium: 4.3 mmol/L (ref 3.5–5.1)
Sodium: 136 mmol/L (ref 135–145)

## 2023-09-20 LAB — CBC
HCT: 33.8 % — ABNORMAL LOW (ref 39.0–52.0)
Hemoglobin: 11.1 g/dL — ABNORMAL LOW (ref 13.0–17.0)
MCH: 30.5 pg (ref 26.0–34.0)
MCHC: 32.8 g/dL (ref 30.0–36.0)
MCV: 92.9 fL (ref 80.0–100.0)
Platelets: 417 10*3/uL — ABNORMAL HIGH (ref 150–400)
RBC: 3.64 MIL/uL — ABNORMAL LOW (ref 4.22–5.81)
RDW: 14.7 % (ref 11.5–15.5)
WBC: 7.1 10*3/uL (ref 4.0–10.5)
nRBC: 0 % (ref 0.0–0.2)

## 2023-09-20 MED ORDER — QUETIAPINE FUMARATE 25 MG PO TABS
25.0000 mg | ORAL_TABLET | Freq: Every evening | ORAL | Status: AC | PRN
Start: 2023-09-20 — End: ?

## 2023-09-20 MED ORDER — CEFADROXIL 500 MG PO CAPS: 1000.0000 mg | ORAL_CAPSULE | Freq: Two times a day (BID) | ORAL | Status: AC

## 2023-09-20 NOTE — Progress Notes (Signed)
 Mobility Specialist - Progress Note   09/20/23 1105  Mobility  Activity Ambulated with assistance in hallway  Level of Assistance Contact guard assist, steadying assist  Assistive Device Front wheel walker  Distance Ambulated (ft) 100 ft  Activity Response Tolerated well  Mobility Referral Yes  Mobility visit 1 Mobility  Mobility Specialist Start Time (ACUTE ONLY) 1010  Mobility Specialist Stop Time (ACUTE ONLY) 1029  Mobility Specialist Time Calculation (min) (ACUTE ONLY) 19 min   Pt received in bed and agreeable to mobility. Pt was modA from supine>sitting & STS. Pt required verbal cues for walker safety and direction. C/o R leg hurting during ambulation. Pt took x1 seated rest break and wheeled back into room. Pt to recliner after session with all needs met. Chair alarm on.   [facility]

## 2023-09-20 NOTE — Discharge Summary (Signed)
 Physician Discharge Summary  Erik Jackson FMW:[id number] DOB: 1931/04/30 DOA: 09/13/2023  PCP: [name], MD  Admit date: 09/13/2023 Discharge date: 09/20/2023 Admitted From: Home Disposition: SNF Recommendations for Outpatient Follow-up:   Check CMP and CBC in 1 to 2 weeks Please follow up on the following pending results: None  Discharge Condition: Stable CODE STATUS: Full code I just went by peripheral likely find  Contact information for follow-up providers     Shari Easter, MD. Schedule an appointment as soon as possible for a visit in 2 week(s).   Specialty: Orthopedic Surgery Contact information: [address], [place] [address] [place] [address] [phone]              Contact information for after-discharge care     Destination     [facility] .   Service: Skilled Nursing Contact information: [address] [place]  [address] [phone]                     Hospital course 89 year old male with dementia, HTN, hypothyroidism, hyperlipidemia, asthma anxiety, TURP presented to ED for generalized weakness, increased somnolence, cough and not eating or drinking for the last 3 days.  Patient was noted to have redness and swelling of his right wrist.  Family reported to ED physician that patient had had fallen asleep with his wrist hyperextended a few days ago and was found with his teeth digging into the skin of his wrist. In ED, temp 101.3 F, HR 93, RR 18, BP 140/75, O2 sats 94% on room air. UA negative for UTI, lactic acid normal, COVID negative. CXR showed no acute cardiopulmonary disease. X-ray of the right wrist showed mild thumb carpometacarpal osteoarthritis and no acute fracture or dislocation.  Evaluated by orthopedic surgery, Dr. Ahmad who did not think I&D is indicated.  Low suspicion for septic arthritis.  Treated with different antibiotics from 1/3-1/10.  Discharged on p.o. cefadroxil  for 3  more days.  Outpatient follow-up with orthopedic surgery as above.  Continue wrist brace.  CT head negative for acute intracranial abnormality. Patient was admitted for further workup.  Anti-infectives (From admission, onward)    Start     Dose/Rate Route Frequency Ordered Stop   09/20/23 0000  cefadroxil  (DURICEF) 500 MG capsule        1,000 mg Oral 2 times daily 09/20/23 1126 09/23/23 2359   09/14/23 2200  vancomycin  (VANCOREADY) IVPB 1250 mg/250 mL  Status:  Discontinued        1,250 mg 166.7 mL/hr over 90 Minutes Intravenous Every 24 hours 09/13/23 2106 09/17/23 1327   09/14/23 0000  Ampicillin -Sulbactam (UNASYN ) 3 g in sodium chloride  0.9 % 100 mL IVPB        3 g 200 mL/hr over 30 Minutes Intravenous Every 6 hours 09/13/23 2101 09/20/23 2359   09/13/23 2115  Vancomycin  (VANCOCIN ) 1,500 mg in sodium chloride  0.9 % 500 mL IVPB        1,500 mg 250 mL/hr over 120 Minutes Intravenous  Once 09/13/23 2106 09/14/23 0047   09/13/23 2000  cefTRIAXone  (ROCEPHIN ) 1 g in sodium chloride  0.9 % 100 mL IVPB        1 g 200 mL/hr over 30 Minutes Intravenous  Once 09/13/23 1949 09/13/23 2035         09/19/2023 cellulitis improved, awaiting placement  See individual problem list below for more.   Problems addressed during this hospitalization Severe sepsis due to right  wrist cellulitis, nonpurulent: POA -Presented with fevers, tachycardia, altered mental status.  Right wrist with erythema extending to the dorsum of the hand, swelling, tender to touch, ROM decreased due to pain -CRP 19.7, uric acid 5.4, ESR 97 -Evaluated by hand surgery, Dr. Ahmad, does not appear to be septic arthritis and does not I&D -Ceftriaxone  1/3.  Vancomycin  1/3-1/7.  Unasyn  1/4-1/10.  P.o. cefadroxil  1 g twice daily for 3 more days. -Continue wrist brace -Outpatient follow-up with orthopedic surgery as above    Acute metabolic encephalopathy in the setting of dementia: Awake and alert but difficult to assess orientation  due to significantly diminished hearing.  Follows commands. CT head showed no acute intracranial abnormality.  TSH 0.58.  B12 low at 278. -Decreased p.o. Seroquel  to 25 mg as needed at night -Continue home Aricept . -Ensure hydration     Cough with possible aspiration on 1/5: Chest x-ray showed no active disease -SLP eval ration showed mild aspiration risk, placed on dysphagia 3 mechanical soft diet with thin liquids -Completed 7 days of Unasyn  as above     FTT, generalized weakness, poor oral intake -Encourage p.o. diet, PT evaluation recommended home health however per patient's son, they are unable to provide care or supervision, and he is not safe being alone -Family requesting SNF, TOC consult placed   Essential hypertension: BP slightly elevated. -Resume home lisinopril .     Hyperlipidemia -Continue Crestor    Hypothyroidism -Continue levothyroxine  75 mcg daily            Time spent 35 minutes  Vital signs Vitals:   09/18/23 2217 09/19/23 1343 09/19/23 2227 09/20/23 0602  BP: (!) 115/54 (!) 145/80 (!) 114/57 (!) 142/66  Pulse: 75 70 70 67  Temp: 98.4 F (36.9 C) (!) 97.5 F (36.4 C) 97.9 F (36.6 C) (!) 97.5 F (36.4 C)  Resp: 20 16 20 20   Height:      Weight:      SpO2: 95% 100% 96% 99%  TempSrc: Oral Axillary Oral Oral  BMI (Calculated):         Discharge exam  GENERAL: No apparent distress.  Nontoxic. HEENT: MMM.  Vision grossly intact.  Significantly diminished hearing. NECK: Supple.  No apparent JVD.  RESP:  No IWOB.  Fair aeration bilaterally. CVS:  RRR. Heart sounds normal.  ABD/GI/GU: BS+. Abd soft, NTND.  MSK/EXT:  Moves extremities. No apparent deformity. No edema.  Right wrist in brace.  No erythema. SKIN: no apparent skin lesion or wound NEURO: Awake and alert.  Difficult to assess orientation due to significantly diminished hearing.  No apparent focal neuro deficit. PSYCH: Calm. Normal affect.   Discharge Instructions Discharge  Instructions     Diet general   Complete by: As directed    Increase activity slowly   Complete by: As directed       Allergies as of 09/20/2023       Reactions   Morphine  Other (See Comments)   Patient requests to not be given this for pain- religious reasons   Amlodipine Other (See Comments)   Edema   Hydrochlorothiazide  Other (See Comments)   Dizziness   Tape Other (See Comments)   Removing Band-Aids highly irritate the skin   Tessalon  [benzonatate ] Other (See Comments)   Unknown reaction        Medication List     TAKE these medications    aspirin  81 MG tablet Take 1 tablet (81 mg total) by mouth 2 (two) times daily after a meal.  What changed: when to take this   cefadroxil  500 MG capsule Commonly known as: DURICEF Take 2 capsules (1,000 mg total) by mouth 2 (two) times daily for 3 days.   donepezil  5 MG tablet Commonly known as: ARICEPT  Take 1 tablet (5 mg total) by mouth at bedtime.   levothyroxine  75 MCG tablet Commonly known as: SYNTHROID  Take 1 tablet (75 mcg total) by mouth daily before breakfast.   lisinopril  20 MG tablet Commonly known as: ZESTRIL  Take 1 tablet (20 mg total) by mouth daily.   Mucinex  Fast-Max DM Max 20-400 MG/20ML Liqd Generic drug: Dextromethorphan-guaiFENesin  Take 5 mLs by mouth every 6 (six) hours as needed (for coughing and chest congestion).   QUEtiapine  25 MG tablet Commonly known as: SEROquel  Take 1 tablet (25 mg total) by mouth at bedtime as needed (Sleep or agitation). What changed:  medication strength how much to take when to take this reasons to take this   rosuvastatin  20 MG tablet Commonly known as: CRESTOR  TAKE 1 TABLET BY MOUTH ONCE DAILY What changed:  how much to take when to take this        Consultations: Orthopedic surgery  Procedures/Studies:    DG CHEST PORT 1 VIEW Result Date: 09/15/2023 CLINICAL DATA:  Dyspnea EXAM: PORTABLE CHEST 1 VIEW COMPARISON:  Chest radiograph dated  09/13/2023. FINDINGS: The heart size and mediastinal contours are within normal limits. Both lungs are clear. Degenerative changes are seen in the spine. IMPRESSION: No active disease. Electronically Signed   By: Norman Hopper M.D.   On: 09/15/2023 15:08   CT HEAD WO CONTRAST ( ) Result Date: 09/13/2023 CLINICAL DATA:  Brought in by EMS due to failure to thrive, poor intake, incontinence, nonproductive cough. Baseline dementia. Mental status change. EXAM: CT HEAD WITHOUT CONTRAST TECHNIQUE: Contiguous axial images were obtained from the base of the skull through the vertex without intravenous contrast. RADIATION DOSE REDUCTION: This exam was performed according to the departmental dose-optimization program which includes automated exposure control, adjustment of the mA and/or kV according to patient size and/or use of iterative reconstruction technique. COMPARISON:  CT head 06/23/2022 FINDINGS: Brain: No intracranial hemorrhage, mass effect, or evidence of acute infarct. No hydrocephalus. No extra-axial fluid collection. Age-commensurate cerebral atrophy and chronic small vessel ischemic disease. Vascular: No hyperdense vessel. Intracranial arterial calcification. Skull: No fracture or focal lesion. Sinuses/Orbits: No acute finding. Other: None. IMPRESSION: No acute intracranial abnormality. Electronically Signed   By: Norman Gatlin M.D.   On: 09/13/2023 19:08   DG Chest 2 View Result Date: 09/13/2023 CLINICAL DATA:  Sepsis EXAM: CHEST - 2 VIEW COMPARISON:  X-ray 08/06/2022.  Older exams as well. FINDINGS: Underinflation. No consolidation, pneumothorax or effusion. No edema. Normal cardiopericardial silhouette. Calcified tortuous aorta. Overlapping cardiac leads. Degenerative changes along the spine. Fixation hardware along the lower cervical spine. Degenerative changes of the shoulders. IMPRESSION: Underinflation.  No acute cardiopulmonary disease. Electronically Signed   By: Ranell Bring M.D.   On:  09/13/2023 18:22   DG Wrist Complete Right Result Date: 09/13/2023 CLINICAL DATA:  Right wrist pain, redness, and swelling. EXAM: RIGHT WRIST - COMPLETE 3+ VIEW COMPARISON:  None Available. FINDINGS: Mild thumb carpometacarpal joint space narrowing and peripheral osteophytosis. Mild calcification within the triangular fibrocartilage complex. No acute fracture or dislocation. Mild atherosclerotic calcifications. IMPRESSION: Mild thumb carpometacarpal osteoarthritis. Electronically Signed   By: Tanda Lyons M.D.   On: 09/13/2023 18:04       The results of significant diagnostics from this hospitalization (including imaging,  microbiology, ancillary and laboratory) are listed below for reference.     Microbiology: Recent Results (from the past 240 hours)  Culture, blood (Routine x 2)     Status: None   Collection Time: 09/13/23  3:59 PM   Specimen: BLOOD LEFT FOREARM  Result Value Ref Range Status   Specimen Description   Final    BLOOD LEFT FOREARM BOTTLES DRAWN AEROBIC AND ANAEROBIC Performed at [facility], [address] [place], [place] [address]    Special Requests   Final    Blood Culture adequate volume Performed at [facility], [address], [place], [place] [address]    Culture   Final    NO GROWTH 5 DAYS Performed at [facility], [address] [place], [place] [address]    Report Status 09/18/2023 FINAL  Final  Culture, blood (Routine x 2)     Status: None   Collection Time: 09/13/23  4:22 PM   Specimen: BLOOD RIGHT FOREARM  Result Value Ref Range Status   Specimen Description   Final    BLOOD RIGHT FOREARM BOTTLES DRAWN AEROBIC AND ANAEROBIC Performed at [facility], [address] [place], [place] [address]    Special Requests   Final    Blood Culture adequate volume Performed at [facility], [address], [place], [place] [address]    Culture   Final    NO GROWTH 5  DAYS Performed at [facility], [address], [place], [place] [address]    Report Status 09/18/2023 FINAL  Final  SARS Coronavirus 2 by RT PCR (hospital order, performed in [facility] hospital lab) *cepheid single result test* Anterior Nasal Swab     Status: None   Collection Time: 09/13/23  4:40 PM   Specimen: Anterior Nasal Swab  Result Value Ref Range Status   SARS Coronavirus 2 by RT PCR NEGATIVE NEGATIVE Final    Comment: (NOTE) SARS-CoV-2 target nucleic acids are NOT DETECTED.  The SARS-CoV-2 RNA is generally detectable in upper and lower respiratory specimens during the acute phase of infection. The lowest concentration of SARS-CoV-2 viral copies this assay can detect is 250 copies / mL. A negative result does not preclude SARS-CoV-2 infection and should not be used as the sole basis for treatment or other patient management decisions.  A negative result may occur with improper specimen collection / handling, submission of specimen other than nasopharyngeal swab, presence of viral mutation(s) within the areas targeted by this assay, and inadequate number of viral copies (<250 copies / mL). A negative result must be combined with clinical observations, patient history, and epidemiological information.  Fact Sheet for Patients:   roadlaptop.co.za  Fact Sheet for Healthcare Providers: http://kim-miller.com/  This test is not yet approved or  cleared by the [place]  FDA and has been authorized for detection and/or diagnosis of SARS-CoV-2 by FDA under an Emergency Use Authorization (EUA).  This EUA will remain in effect (meaning this test can be used) for the duration of the COVID-19 declaration under Section 564(b)(1) of the Act, 21 U.S.C. section 360bbb-3(b)(1), unless the authorization is terminated or revoked sooner.  Performed at [facility], [address], [place], [place] [address]    Respiratory (~20 pathogens) panel by PCR     Status: None   Collection Time: 09/13/23  7:54 PM   Specimen: Nasopharyngeal Swab; Respiratory  Result Value Ref Range Status   Adenovirus NOT DETECTED  NOT DETECTED Final   Coronavirus 229E NOT DETECTED NOT DETECTED Final    Comment: (NOTE) The Coronavirus on the Respiratory Panel, DOES NOT test for the novel  Coronavirus (2019 nCoV)    Coronavirus HKU1 NOT DETECTED NOT DETECTED Final   Coronavirus NL63 NOT DETECTED NOT DETECTED Final   Coronavirus OC43 NOT DETECTED NOT DETECTED Final   Metapneumovirus NOT DETECTED NOT DETECTED Final   Rhinovirus / Enterovirus NOT DETECTED NOT DETECTED Final   Influenza A NOT DETECTED NOT DETECTED Final   Influenza B NOT DETECTED NOT DETECTED Final   Parainfluenza Virus 1 NOT DETECTED NOT DETECTED Final   Parainfluenza Virus 2 NOT DETECTED NOT DETECTED Final   Parainfluenza Virus 3 NOT DETECTED NOT DETECTED Final   Parainfluenza Virus 4 NOT DETECTED NOT DETECTED Final   Respiratory Syncytial Virus NOT DETECTED NOT DETECTED Final   Bordetella pertussis NOT DETECTED NOT DETECTED Final   Bordetella Parapertussis NOT DETECTED NOT DETECTED Final   Chlamydophila pneumoniae NOT DETECTED NOT DETECTED Final   Mycoplasma pneumoniae NOT DETECTED NOT DETECTED Final    Comment: Performed at [facility], [address] [place], [place] [address]     Labs:  CBC: Recent Labs  Lab 09/13/23 1614 09/14/23 0543 09/15/23 0533 09/16/23 0440 09/19/23 0649 09/20/23 0455  WBC 9.9 8.3 8.1 7.7 6.9 7.1  NEUTROABS 8.0*  --   --   --   --   --   HGB 12.8* 11.0* 10.6* 10.3* 11.5* 11.1*  HCT 39.5 33.7* 33.3* 33.2* 33.6* 33.8*  MCV 94.3 94.9 96.0 98.8 91.3 92.9  PLT 433* 373 331 317 414* 417*   BMP &GFR Recent Labs  Lab 09/14/23 0543 09/15/23 0533 09/16/23 0440 09/17/23 0445 09/19/23 0649 09/20/23 0455  NA 138 139 135  --  133* 136  K 3.9 3.7 3.6  --  3.5 4.3  CL 106 106 103  --  98 101  CO2 23 24 22    --  24 26  GLUCOSE 83 82 78  --  89 91  BUN 21 21 18   --  19 21  CREATININE 0.85 0.86 0.73 0.77 0.66 0.68  CALCIUM  8.3* 8.4* 8.1*  --  8.1* 7.9*  MG 1.8  --   --   --   --   --    Estimated Creatinine Clearance: 61.4 mL/min (by C-G formula based on SCr of 0.68 mg/dL). Liver & Pancreas: Recent Labs  Lab 09/13/23 1614  AST 24  ALT 23  ALKPHOS 90  BILITOT 0.6  PROT 7.8  ALBUMIN 3.2*   No results for input(s): LIPASE, AMYLASE in the last 168 hours. Recent Labs  Lab 09/13/23 2143  AMMONIA 14   Diabetic: No results for input(s): HGBA1C in the last 72 hours. No results for input(s): GLUCAP in the last 168 hours. Cardiac Enzymes: No results for input(s): CKTOTAL, CKMB, CKMBINDEX, TROPONINI in the last 168 hours. No results for input(s): PROBNP in the last 8760 hours. Coagulation Profile: Recent Labs  Lab 09/13/23 1614  INR 1.3*   Thyroid  Function Tests: No results for input(s): TSH, T4TOTAL, FREET4, T3FREE, THYROIDAB in the last 72 hours. Lipid Profile: No results for input(s): CHOL, HDL, LDLCALC, TRIG, CHOLHDL, LDLDIRECT in the last 72 hours. Anemia Panel: No results for input(s): VITAMINB12, FOLATE, FERRITIN, TIBC, IRON, RETICCTPCT in the last 72 hours. Urine analysis:    Component Value Date/Time   COLORURINE YELLOW 09/13/2023 1614   APPEARANCEUR CLEAR 09/13/2023 1614   LABSPEC 1.025 09/13/2023  1614   PHURINE 5.0 09/13/2023 1614   GLUCOSEU NEGATIVE 09/13/2023 1614   GLUCOSEU NEGATIVE 03/06/2023 1153   HGBUR NEGATIVE 09/13/2023 1614   BILIRUBINUR NEGATIVE 09/13/2023 1614   KETONESUR 5 (A) 09/13/2023 1614   PROTEINUR 30 (A) 09/13/2023 1614   UROBILINOGEN 0.2 03/06/2023 1153   NITRITE NEGATIVE 09/13/2023 1614   LEUKOCYTESUR NEGATIVE 09/13/2023 1614   Sepsis Labs: Invalid input(s): PROCALCITONIN, LACTICIDVEN   SIGNED:  Annelie Boak T Harjot Zavadil, MD  [facility] 09/20/2023, 12:18 PM

## 2023-09-20 NOTE — TOC Progression Note (Addendum)
 Transition of Care [facility]) - Progression Note    Patient Details  Name: Erik Jackson MRN: [id number] Date of Birth: August 07, 1931  [facility]) CM/SW Contact  [name], RN Phone Number: 09/20/2023, 7:56 AM  Clinical Narrative: Short term rehab/SNF auth approved. Plan Auth ID [id number], days   approved  1/9 to 09/23/2023, team notified.   -12:24pm Call to [facility], admissions coordinator at [facility], confirmed bed available for admit today, RM 308, Call Report [phone]. [facility] for transport,. No further TOC needs.   Expected Discharge Plan: Skilled Nursing Facility Barriers to Discharge: Continued Medical Work up  Expected Discharge Plan and Services       Living arrangements for the past 2 months: Single Family Home                                       Social Determinants of Health (SDOH) Interventions SDOH Screenings   Food Insecurity: No Food Insecurity (09/14/2023)  Housing: Patient Declined (09/16/2023)  Transportation Needs: No Transportation Needs (09/14/2023)  Utilities: Not At Risk (09/14/2023)  Alcohol Screen: Low Risk  (03/06/2023)  Depression (PHQ2-9): High Risk (04/30/2023)  Financial Resource Strain: Low Risk  (03/06/2023)  Physical Activity: Sufficiently Active (03/06/2023)  Social Connections: Patient Unable To Answer (09/14/2023)  Stress: No Stress Concern Present (03/06/2023)  Tobacco Use: Medium Risk (09/13/2023)    Readmission Risk Interventions    09/16/2023    3:01 PM 08/09/2022   10:52 AM  Readmission Risk Prevention Plan  Post Dischage Appt Complete   Medication Screening Complete   Transportation Screening Complete Complete  PCP or Specialist Appt within 5-7 Days  Complete  Home Care Screening  Complete  Medication Review (RN CM)  Complete

## 2023-09-20 NOTE — Progress Notes (Signed)
 Patient report called to facility. [facility] transporting and package sent with them. All questions answered and Erik Jackson was notified.

## 2023-09-20 NOTE — TOC Transition Note (Signed)
 Transition of Care [facility]) - Discharge Note   Patient Details  Name: Erik Jackson MRN: [id number] Date of Birth: [birth year]  [facility]) CM/SW Contact:  [name], RN Phone Number: 09/20/2023, 12:24 PM   Clinical Narrative:  DC to SNF. Marionette Milian, rep-Kellie, admissions coordinator, confirmed bed  available for transfer today, RM 308 Call Report [phone]. [facility] for transport. No further TOC needs.       Final next level of care: Skilled Nursing Facility Barriers to Discharge: Barriers Resolved   Patient Goals and CMS Choice Patient states their goals for this hospitalization and ongoing recovery are:: return home CMS Medicare.gov Compare Post Acute Care list provided to:: Patient Represenative (must comment) (Haglund,Tim (Son)  [phone] (Mobile)) Choice offered to / list presented to : Adult Children Eitan, Doubleday (Son)  [phone] [facility])) [place] ownership interest in [facility]provided to:: Adult Children    Discharge Placement              Patient chooses bed at: [facility] Patient to be transferred to facility by: PT[facility] Name of family member notified: Rodenberg,Tim (Son)  [phone] [facility]) Patient and family notified of of transfer: 09/20/23  Discharge Plan and Services Additional resources added to the After Visit Summary for                                       Social Drivers of Health (SDOH) Interventions SDOH Screenings   Food Insecurity: No Food Insecurity (09/14/2023)  Housing: Patient Declined (09/16/2023)  Transportation Needs: No Transportation Needs (09/14/2023)  Utilities: Not At Risk (09/14/2023)  Alcohol Screen: Low Risk  (03/06/2023)  Depression (PHQ2-9): High Risk (04/30/2023)  Financial Resource Strain: Low Risk  (03/06/2023)  Physical Activity: Sufficiently Active (03/06/2023)  Social Connections: Patient Unable To Answer (09/14/2023)  Stress: No Stress Concern Present (03/06/2023)  Tobacco  Use: Medium Risk (09/13/2023)     Readmission Risk Interventions    09/16/2023    3:01 PM 08/09/2022   10:52 AM  Readmission Risk Prevention Plan  Post Dischage Appt Complete   Medication Screening Complete   Transportation Screening Complete Complete  PCP or Specialist Appt within 5-7 Days  Complete  Home Care Screening  Complete  Medication Review (RN CM)  Complete

## 2023-09-21 DIAGNOSIS — R410 Disorientation, unspecified: Secondary | ICD-10-CM | POA: Diagnosis not present

## 2023-09-21 DIAGNOSIS — Z888 Allergy status to other drugs, medicaments and biological substances status: Secondary | ICD-10-CM | POA: Diagnosis not present

## 2023-09-21 DIAGNOSIS — R9431 Abnormal electrocardiogram [ECG] [EKG]: Secondary | ICD-10-CM | POA: Diagnosis not present

## 2023-09-21 DIAGNOSIS — S0990XA Unspecified injury of head, initial encounter: Secondary | ICD-10-CM | POA: Diagnosis not present

## 2023-09-21 DIAGNOSIS — Z743 Need for continuous supervision: Secondary | ICD-10-CM | POA: Diagnosis not present

## 2023-09-21 DIAGNOSIS — I10 Essential (primary) hypertension: Secondary | ICD-10-CM | POA: Diagnosis not present

## 2023-09-21 DIAGNOSIS — Z043 Encounter for examination and observation following other accident: Secondary | ICD-10-CM | POA: Diagnosis not present

## 2023-09-21 DIAGNOSIS — W19XXXA Unspecified fall, initial encounter: Secondary | ICD-10-CM | POA: Diagnosis not present

## 2023-09-21 DIAGNOSIS — Z79899 Other long term (current) drug therapy: Secondary | ICD-10-CM | POA: Diagnosis not present

## 2023-09-21 DIAGNOSIS — S0081XA Abrasion of other part of head, initial encounter: Secondary | ICD-10-CM | POA: Diagnosis not present

## 2023-09-25 DIAGNOSIS — E039 Hypothyroidism, unspecified: Secondary | ICD-10-CM | POA: Diagnosis not present

## 2023-09-25 DIAGNOSIS — E785 Hyperlipidemia, unspecified: Secondary | ICD-10-CM | POA: Diagnosis not present

## 2023-09-25 DIAGNOSIS — I10 Essential (primary) hypertension: Secondary | ICD-10-CM | POA: Diagnosis not present

## 2023-09-25 DIAGNOSIS — R627 Adult failure to thrive: Secondary | ICD-10-CM | POA: Diagnosis not present

## 2023-09-25 DIAGNOSIS — J45909 Unspecified asthma, uncomplicated: Secondary | ICD-10-CM | POA: Diagnosis not present

## 2023-09-25 DIAGNOSIS — S0181XA Laceration without foreign body of other part of head, initial encounter: Secondary | ICD-10-CM | POA: Diagnosis not present

## 2023-09-25 DIAGNOSIS — L03113 Cellulitis of right upper limb: Secondary | ICD-10-CM | POA: Diagnosis not present

## 2023-09-26 DIAGNOSIS — E785 Hyperlipidemia, unspecified: Secondary | ICD-10-CM | POA: Diagnosis not present

## 2023-09-26 DIAGNOSIS — S0181XA Laceration without foreign body of other part of head, initial encounter: Secondary | ICD-10-CM | POA: Diagnosis not present

## 2023-09-26 DIAGNOSIS — I10 Essential (primary) hypertension: Secondary | ICD-10-CM | POA: Diagnosis not present

## 2023-09-26 DIAGNOSIS — E039 Hypothyroidism, unspecified: Secondary | ICD-10-CM | POA: Diagnosis not present

## 2023-09-26 DIAGNOSIS — J45909 Unspecified asthma, uncomplicated: Secondary | ICD-10-CM | POA: Diagnosis not present

## 2023-09-26 DIAGNOSIS — L03113 Cellulitis of right upper limb: Secondary | ICD-10-CM | POA: Diagnosis not present

## 2023-09-26 DIAGNOSIS — R627 Adult failure to thrive: Secondary | ICD-10-CM | POA: Diagnosis not present

## 2023-09-30 DIAGNOSIS — I10 Essential (primary) hypertension: Secondary | ICD-10-CM | POA: Diagnosis not present

## 2023-09-30 DIAGNOSIS — Z872 Personal history of diseases of the skin and subcutaneous tissue: Secondary | ICD-10-CM | POA: Diagnosis not present

## 2023-09-30 DIAGNOSIS — E039 Hypothyroidism, unspecified: Secondary | ICD-10-CM | POA: Diagnosis not present

## 2023-09-30 DIAGNOSIS — S50812A Abrasion of left forearm, initial encounter: Secondary | ICD-10-CM | POA: Diagnosis not present

## 2023-09-30 DIAGNOSIS — J45909 Unspecified asthma, uncomplicated: Secondary | ICD-10-CM | POA: Diagnosis not present

## 2023-10-01 DIAGNOSIS — N390 Urinary tract infection, site not specified: Secondary | ICD-10-CM | POA: Diagnosis not present

## 2023-10-04 DIAGNOSIS — E039 Hypothyroidism, unspecified: Secondary | ICD-10-CM | POA: Diagnosis not present

## 2023-10-07 DIAGNOSIS — H109 Unspecified conjunctivitis: Secondary | ICD-10-CM | POA: Diagnosis not present

## 2023-10-15 DIAGNOSIS — E039 Hypothyroidism, unspecified: Secondary | ICD-10-CM | POA: Diagnosis not present

## 2023-10-15 DIAGNOSIS — Z8669 Personal history of other diseases of the nervous system and sense organs: Secondary | ICD-10-CM | POA: Diagnosis not present

## 2023-10-15 DIAGNOSIS — I10 Essential (primary) hypertension: Secondary | ICD-10-CM | POA: Diagnosis not present

## 2023-10-15 DIAGNOSIS — J45909 Unspecified asthma, uncomplicated: Secondary | ICD-10-CM | POA: Diagnosis not present

## 2023-10-15 DIAGNOSIS — E785 Hyperlipidemia, unspecified: Secondary | ICD-10-CM | POA: Diagnosis not present

## 2023-10-15 DIAGNOSIS — Z515 Encounter for palliative care: Secondary | ICD-10-CM | POA: Diagnosis not present

## 2023-10-15 DIAGNOSIS — Z872 Personal history of diseases of the skin and subcutaneous tissue: Secondary | ICD-10-CM | POA: Diagnosis not present

## 2023-11-04 ENCOUNTER — Ambulatory Visit: Payer: Medicare Other | Admitting: Internal Medicine

## 2023-11-09 DEATH — deceased

## 2024-03-11 ENCOUNTER — Ambulatory Visit: Payer: Medicare Other | Admitting: Internal Medicine
